# Patient Record
Sex: Female | Born: 1980 | Race: Black or African American | Hispanic: No | Marital: Single | Smoking: Former smoker
Health system: Southern US, Community
[De-identification: ages and names within clinical notes are randomized; demographics above are authoritative.]

## PROBLEM LIST (undated history)

## (undated) DIAGNOSIS — R51 Headache: Secondary | ICD-10-CM

## (undated) DIAGNOSIS — F319 Bipolar disorder, unspecified: Secondary | ICD-10-CM

## (undated) DIAGNOSIS — D649 Anemia, unspecified: Secondary | ICD-10-CM

## (undated) DIAGNOSIS — N611 Abscess of the breast and nipple: Secondary | ICD-10-CM

## (undated) DIAGNOSIS — F329 Major depressive disorder, single episode, unspecified: Secondary | ICD-10-CM

## (undated) DIAGNOSIS — E669 Obesity, unspecified: Secondary | ICD-10-CM

## (undated) DIAGNOSIS — R519 Headache, unspecified: Secondary | ICD-10-CM

## (undated) DIAGNOSIS — J309 Allergic rhinitis, unspecified: Secondary | ICD-10-CM

## (undated) DIAGNOSIS — L729 Follicular cyst of the skin and subcutaneous tissue, unspecified: Secondary | ICD-10-CM

## (undated) DIAGNOSIS — N8110 Cystocele, unspecified: Secondary | ICD-10-CM

## (undated) DIAGNOSIS — B009 Herpesviral infection, unspecified: Secondary | ICD-10-CM

## (undated) DIAGNOSIS — Z973 Presence of spectacles and contact lenses: Secondary | ICD-10-CM

## (undated) DIAGNOSIS — T7422XA Child sexual abuse, confirmed, initial encounter: Secondary | ICD-10-CM

## (undated) DIAGNOSIS — F419 Anxiety disorder, unspecified: Secondary | ICD-10-CM

## (undated) DIAGNOSIS — N739 Female pelvic inflammatory disease, unspecified: Secondary | ICD-10-CM

## (undated) DIAGNOSIS — F32A Depression, unspecified: Secondary | ICD-10-CM

## (undated) DIAGNOSIS — L0591 Pilonidal cyst without abscess: Secondary | ICD-10-CM

## (undated) DIAGNOSIS — K219 Gastro-esophageal reflux disease without esophagitis: Secondary | ICD-10-CM

## (undated) DIAGNOSIS — M722 Plantar fascial fibromatosis: Secondary | ICD-10-CM

## (undated) HISTORY — DX: Depression, unspecified: F32.A

## (undated) HISTORY — DX: Child sexual abuse, confirmed, initial encounter: T74.22XA

## (undated) HISTORY — DX: Female pelvic inflammatory disease, unspecified: N73.9

## (undated) HISTORY — DX: Anxiety disorder, unspecified: F41.9

## (undated) HISTORY — PX: BREAST LUMPECTOMY: SHX2

## (undated) HISTORY — PX: WISDOM TOOTH EXTRACTION: SHX21

## (undated) HISTORY — DX: Major depressive disorder, single episode, unspecified: F32.9

---
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
## 2000-04-27 ENCOUNTER — Emergency Department (HOSPITAL_COMMUNITY): Admission: EM | Admit: 2000-04-27 | Discharge: 2000-04-27 | Payer: Self-pay | Admitting: Emergency Medicine

## 2000-05-21 ENCOUNTER — Emergency Department (HOSPITAL_COMMUNITY): Admission: EM | Admit: 2000-05-21 | Discharge: 2000-05-21 | Payer: Self-pay | Admitting: Emergency Medicine

## 2000-05-21 ENCOUNTER — Encounter: Payer: Self-pay | Admitting: Emergency Medicine

## 2000-12-07 ENCOUNTER — Emergency Department (HOSPITAL_COMMUNITY): Admission: EM | Admit: 2000-12-07 | Discharge: 2000-12-07 | Payer: Self-pay | Admitting: Emergency Medicine

## 2000-12-29 ENCOUNTER — Emergency Department (HOSPITAL_COMMUNITY): Admission: EM | Admit: 2000-12-29 | Discharge: 2000-12-29 | Payer: Self-pay | Admitting: Emergency Medicine

## 2001-07-05 ENCOUNTER — Emergency Department (HOSPITAL_COMMUNITY): Admission: EM | Admit: 2001-07-05 | Discharge: 2001-07-05 | Payer: Self-pay | Admitting: Emergency Medicine

## 2001-08-15 ENCOUNTER — Emergency Department (HOSPITAL_COMMUNITY): Admission: EM | Admit: 2001-08-15 | Discharge: 2001-08-15 | Payer: Self-pay | Admitting: Emergency Medicine

## 2001-08-31 ENCOUNTER — Emergency Department (HOSPITAL_COMMUNITY): Admission: EM | Admit: 2001-08-31 | Discharge: 2001-08-31 | Payer: Self-pay | Admitting: Emergency Medicine

## 2001-10-16 ENCOUNTER — Emergency Department (HOSPITAL_COMMUNITY): Admission: EM | Admit: 2001-10-16 | Discharge: 2001-10-16 | Payer: Self-pay | Admitting: Emergency Medicine

## 2001-11-08 ENCOUNTER — Emergency Department (HOSPITAL_COMMUNITY): Admission: EM | Admit: 2001-11-08 | Discharge: 2001-11-09 | Payer: Self-pay | Admitting: Emergency Medicine

## 2001-12-21 ENCOUNTER — Encounter: Admission: RE | Admit: 2001-12-21 | Discharge: 2001-12-21 | Payer: Self-pay | Admitting: Family Medicine

## 2001-12-28 ENCOUNTER — Encounter: Admission: RE | Admit: 2001-12-28 | Discharge: 2001-12-28 | Payer: Self-pay | Admitting: Family Medicine

## 2002-01-10 ENCOUNTER — Encounter: Admission: RE | Admit: 2002-01-10 | Discharge: 2002-01-10 | Payer: Self-pay | Admitting: Sports Medicine

## 2002-01-17 ENCOUNTER — Encounter: Admission: RE | Admit: 2002-01-17 | Discharge: 2002-01-17 | Payer: Self-pay | Admitting: Family Medicine

## 2002-01-31 ENCOUNTER — Inpatient Hospital Stay (HOSPITAL_COMMUNITY): Admission: AD | Admit: 2002-01-31 | Discharge: 2002-02-01 | Payer: Self-pay | Admitting: Family Medicine

## 2002-01-31 ENCOUNTER — Encounter: Admission: RE | Admit: 2002-01-31 | Discharge: 2002-01-31 | Payer: Self-pay | Admitting: Family Medicine

## 2002-01-31 ENCOUNTER — Encounter: Payer: Self-pay | Admitting: Family Medicine

## 2002-02-09 ENCOUNTER — Encounter: Admission: RE | Admit: 2002-02-09 | Discharge: 2002-02-09 | Payer: Self-pay | Admitting: Family Medicine

## 2002-02-19 ENCOUNTER — Encounter: Admission: RE | Admit: 2002-02-19 | Discharge: 2002-02-19 | Payer: Self-pay | Admitting: Family Medicine

## 2002-02-23 ENCOUNTER — Ambulatory Visit (HOSPITAL_COMMUNITY): Admission: RE | Admit: 2002-02-23 | Discharge: 2002-02-23 | Payer: Self-pay | Admitting: Family Medicine

## 2002-03-02 ENCOUNTER — Encounter: Admission: RE | Admit: 2002-03-02 | Discharge: 2002-03-02 | Payer: Self-pay | Admitting: Family Medicine

## 2002-03-29 ENCOUNTER — Observation Stay (HOSPITAL_COMMUNITY): Admission: AD | Admit: 2002-03-29 | Discharge: 2002-03-30 | Payer: Self-pay | Admitting: *Deleted

## 2002-04-03 ENCOUNTER — Encounter: Admission: RE | Admit: 2002-04-03 | Discharge: 2002-04-03 | Payer: Self-pay | Admitting: Sports Medicine

## 2002-04-11 ENCOUNTER — Encounter: Admission: RE | Admit: 2002-04-11 | Discharge: 2002-04-11 | Payer: Self-pay | Admitting: Family Medicine

## 2002-05-04 ENCOUNTER — Encounter: Admission: RE | Admit: 2002-05-04 | Discharge: 2002-05-04 | Payer: Self-pay | Admitting: Family Medicine

## 2002-05-24 ENCOUNTER — Encounter: Admission: RE | Admit: 2002-05-24 | Discharge: 2002-05-24 | Payer: Self-pay | Admitting: Family Medicine

## 2002-05-28 ENCOUNTER — Inpatient Hospital Stay (HOSPITAL_COMMUNITY): Admission: AD | Admit: 2002-05-28 | Discharge: 2002-05-28 | Payer: Self-pay | Admitting: Family Medicine

## 2002-05-28 ENCOUNTER — Encounter: Admission: RE | Admit: 2002-05-28 | Discharge: 2002-05-28 | Payer: Self-pay | Admitting: Family Medicine

## 2002-05-30 ENCOUNTER — Encounter: Admission: RE | Admit: 2002-05-30 | Discharge: 2002-05-30 | Payer: Self-pay | Admitting: Family Medicine

## 2002-06-08 ENCOUNTER — Encounter: Admission: RE | Admit: 2002-06-08 | Discharge: 2002-06-08 | Payer: Self-pay | Admitting: Family Medicine

## 2002-06-20 ENCOUNTER — Encounter: Admission: RE | Admit: 2002-06-20 | Discharge: 2002-06-20 | Payer: Self-pay | Admitting: Family Medicine

## 2002-06-26 ENCOUNTER — Encounter: Admission: RE | Admit: 2002-06-26 | Discharge: 2002-06-26 | Payer: Self-pay | Admitting: Family Medicine

## 2002-07-03 ENCOUNTER — Encounter: Admission: RE | Admit: 2002-07-03 | Discharge: 2002-07-03 | Payer: Self-pay | Admitting: Sports Medicine

## 2002-07-06 ENCOUNTER — Inpatient Hospital Stay (HOSPITAL_COMMUNITY): Admission: AD | Admit: 2002-07-06 | Discharge: 2002-07-06 | Payer: Self-pay | Admitting: *Deleted

## 2002-07-10 ENCOUNTER — Encounter: Admission: RE | Admit: 2002-07-10 | Discharge: 2002-07-10 | Payer: Self-pay | Admitting: Family Medicine

## 2002-07-18 ENCOUNTER — Encounter: Admission: RE | Admit: 2002-07-18 | Discharge: 2002-07-18 | Payer: Self-pay | Admitting: Family Medicine

## 2002-07-19 ENCOUNTER — Encounter (HOSPITAL_COMMUNITY): Admission: RE | Admit: 2002-07-19 | Discharge: 2002-07-19 | Payer: Self-pay | Admitting: Family Medicine

## 2002-07-23 ENCOUNTER — Encounter: Admission: RE | Admit: 2002-07-23 | Discharge: 2002-07-23 | Payer: Self-pay | Admitting: Family Medicine

## 2002-07-25 ENCOUNTER — Encounter: Admission: RE | Admit: 2002-07-25 | Discharge: 2002-07-25 | Payer: Self-pay | Admitting: Family Medicine

## 2002-07-25 ENCOUNTER — Encounter: Admission: RE | Admit: 2002-07-25 | Discharge: 2002-07-25 | Payer: Self-pay | Admitting: Obstetrics and Gynecology

## 2002-07-27 ENCOUNTER — Inpatient Hospital Stay (HOSPITAL_COMMUNITY): Admission: AD | Admit: 2002-07-27 | Discharge: 2002-07-29 | Payer: Self-pay | Admitting: *Deleted

## 2002-08-21 ENCOUNTER — Inpatient Hospital Stay (HOSPITAL_COMMUNITY): Admission: AD | Admit: 2002-08-21 | Discharge: 2002-08-21 | Payer: Self-pay | Admitting: *Deleted

## 2002-09-05 ENCOUNTER — Encounter: Admission: RE | Admit: 2002-09-05 | Discharge: 2002-09-05 | Payer: Self-pay | Admitting: Family Medicine

## 2002-10-26 ENCOUNTER — Encounter: Admission: RE | Admit: 2002-10-26 | Discharge: 2002-10-26 | Payer: Self-pay | Admitting: Family Medicine

## 2002-11-23 ENCOUNTER — Emergency Department (HOSPITAL_COMMUNITY): Admission: AD | Admit: 2002-11-23 | Discharge: 2002-11-24 | Payer: Self-pay

## 2002-11-24 ENCOUNTER — Encounter: Payer: Self-pay | Admitting: Emergency Medicine

## 2002-11-25 ENCOUNTER — Emergency Department (HOSPITAL_COMMUNITY): Admission: EM | Admit: 2002-11-25 | Discharge: 2002-11-25 | Payer: Self-pay | Admitting: Emergency Medicine

## 2002-11-28 ENCOUNTER — Emergency Department (HOSPITAL_COMMUNITY): Admission: EM | Admit: 2002-11-28 | Discharge: 2002-11-28 | Payer: Self-pay | Admitting: Emergency Medicine

## 2002-12-13 ENCOUNTER — Encounter: Admission: RE | Admit: 2002-12-13 | Discharge: 2002-12-13 | Payer: Self-pay | Admitting: Family Medicine

## 2002-12-14 ENCOUNTER — Encounter: Admission: RE | Admit: 2002-12-14 | Discharge: 2002-12-14 | Payer: Self-pay | Admitting: Family Medicine

## 2002-12-17 ENCOUNTER — Emergency Department (HOSPITAL_COMMUNITY): Admission: EM | Admit: 2002-12-17 | Discharge: 2002-12-17 | Payer: Self-pay

## 2002-12-19 ENCOUNTER — Encounter: Admission: RE | Admit: 2002-12-19 | Discharge: 2002-12-19 | Payer: Self-pay | Admitting: Family Medicine

## 2002-12-26 ENCOUNTER — Emergency Department (HOSPITAL_COMMUNITY): Admission: EM | Admit: 2002-12-26 | Discharge: 2002-12-26 | Payer: Self-pay | Admitting: Emergency Medicine

## 2002-12-26 ENCOUNTER — Encounter: Payer: Self-pay | Admitting: Emergency Medicine

## 2003-01-09 ENCOUNTER — Emergency Department (HOSPITAL_COMMUNITY): Admission: EM | Admit: 2003-01-09 | Discharge: 2003-01-09 | Payer: Self-pay | Admitting: Emergency Medicine

## 2003-01-10 ENCOUNTER — Emergency Department (HOSPITAL_COMMUNITY): Admission: EM | Admit: 2003-01-10 | Discharge: 2003-01-11 | Payer: Self-pay | Admitting: Emergency Medicine

## 2003-01-11 ENCOUNTER — Emergency Department (HOSPITAL_COMMUNITY): Admission: EM | Admit: 2003-01-11 | Discharge: 2003-01-11 | Payer: Self-pay | Admitting: Emergency Medicine

## 2003-01-11 ENCOUNTER — Encounter: Payer: Self-pay | Admitting: Emergency Medicine

## 2003-01-11 ENCOUNTER — Encounter: Admission: RE | Admit: 2003-01-11 | Discharge: 2003-01-11 | Payer: Self-pay | Admitting: Family Medicine

## 2003-01-13 ENCOUNTER — Emergency Department (HOSPITAL_COMMUNITY): Admission: EM | Admit: 2003-01-13 | Discharge: 2003-01-13 | Payer: Self-pay | Admitting: Emergency Medicine

## 2003-01-18 ENCOUNTER — Encounter: Admission: RE | Admit: 2003-01-18 | Discharge: 2003-01-18 | Payer: Self-pay | Admitting: Family Medicine

## 2003-01-24 ENCOUNTER — Emergency Department (HOSPITAL_COMMUNITY): Admission: EM | Admit: 2003-01-24 | Discharge: 2003-01-24 | Payer: Self-pay | Admitting: Emergency Medicine

## 2003-01-27 ENCOUNTER — Emergency Department (HOSPITAL_COMMUNITY): Admission: EM | Admit: 2003-01-27 | Discharge: 2003-01-27 | Payer: Self-pay | Admitting: Emergency Medicine

## 2003-02-25 ENCOUNTER — Emergency Department (HOSPITAL_COMMUNITY): Admission: EM | Admit: 2003-02-25 | Discharge: 2003-02-25 | Payer: Self-pay

## 2003-02-28 ENCOUNTER — Emergency Department (HOSPITAL_COMMUNITY): Admission: AD | Admit: 2003-02-28 | Discharge: 2003-02-28 | Payer: Self-pay | Admitting: Family Medicine

## 2003-03-03 ENCOUNTER — Emergency Department (HOSPITAL_COMMUNITY): Admission: AD | Admit: 2003-03-03 | Discharge: 2003-03-03 | Payer: Self-pay | Admitting: Family Medicine

## 2003-03-04 ENCOUNTER — Emergency Department (HOSPITAL_COMMUNITY): Admission: AD | Admit: 2003-03-04 | Discharge: 2003-03-04 | Payer: Self-pay | Admitting: Family Medicine

## 2003-04-17 ENCOUNTER — Encounter: Admission: RE | Admit: 2003-04-17 | Discharge: 2003-04-17 | Payer: Self-pay | Admitting: Family Medicine

## 2003-05-22 ENCOUNTER — Emergency Department (HOSPITAL_COMMUNITY): Admission: EM | Admit: 2003-05-22 | Discharge: 2003-05-23 | Payer: Self-pay

## 2003-05-25 ENCOUNTER — Emergency Department (HOSPITAL_COMMUNITY): Admission: EM | Admit: 2003-05-25 | Discharge: 2003-05-26 | Payer: Self-pay | Admitting: Emergency Medicine

## 2003-05-30 ENCOUNTER — Emergency Department (HOSPITAL_COMMUNITY): Admission: AD | Admit: 2003-05-30 | Discharge: 2003-05-30 | Payer: Self-pay | Admitting: Family Medicine

## 2003-06-17 ENCOUNTER — Emergency Department (HOSPITAL_COMMUNITY): Admission: AD | Admit: 2003-06-17 | Discharge: 2003-06-17 | Payer: Self-pay | Admitting: Family Medicine

## 2003-06-21 ENCOUNTER — Encounter: Admission: RE | Admit: 2003-06-21 | Discharge: 2003-06-21 | Payer: Self-pay | Admitting: Family Medicine

## 2003-06-24 ENCOUNTER — Encounter: Admission: RE | Admit: 2003-06-24 | Discharge: 2003-06-24 | Payer: Self-pay | Admitting: Family Medicine

## 2003-07-08 ENCOUNTER — Other Ambulatory Visit: Admission: RE | Admit: 2003-07-08 | Discharge: 2003-07-08 | Payer: Self-pay | Admitting: Family Medicine

## 2003-07-08 ENCOUNTER — Encounter (INDEPENDENT_AMBULATORY_CARE_PROVIDER_SITE_OTHER): Payer: Self-pay | Admitting: Specialist

## 2003-07-08 ENCOUNTER — Encounter: Admission: RE | Admit: 2003-07-08 | Discharge: 2003-07-08 | Payer: Self-pay | Admitting: Family Medicine

## 2003-07-21 ENCOUNTER — Emergency Department (HOSPITAL_COMMUNITY): Admission: EM | Admit: 2003-07-21 | Discharge: 2003-07-21 | Payer: Self-pay | Admitting: Family Medicine

## 2003-08-05 ENCOUNTER — Emergency Department (HOSPITAL_COMMUNITY): Admission: EM | Admit: 2003-08-05 | Discharge: 2003-08-05 | Payer: Self-pay | Admitting: Emergency Medicine

## 2003-08-07 ENCOUNTER — Encounter: Admission: RE | Admit: 2003-08-07 | Discharge: 2003-08-07 | Payer: Self-pay | Admitting: Family Medicine

## 2003-09-04 ENCOUNTER — Encounter: Admission: RE | Admit: 2003-09-04 | Discharge: 2003-09-04 | Payer: Self-pay | Admitting: Sports Medicine

## 2003-09-10 ENCOUNTER — Encounter: Admission: RE | Admit: 2003-09-10 | Discharge: 2003-09-10 | Payer: Self-pay | Admitting: Family Medicine

## 2003-09-30 ENCOUNTER — Inpatient Hospital Stay (HOSPITAL_COMMUNITY): Admission: AD | Admit: 2003-09-30 | Discharge: 2003-09-30 | Payer: Self-pay | Admitting: Obstetrics & Gynecology

## 2003-10-08 ENCOUNTER — Emergency Department (HOSPITAL_COMMUNITY): Admission: EM | Admit: 2003-10-08 | Discharge: 2003-10-09 | Payer: Self-pay | Admitting: Emergency Medicine

## 2003-10-14 ENCOUNTER — Encounter: Admission: RE | Admit: 2003-10-14 | Discharge: 2003-10-14 | Payer: Self-pay | Admitting: Family Medicine

## 2003-10-14 ENCOUNTER — Encounter: Admission: RE | Admit: 2003-10-14 | Discharge: 2003-10-14 | Payer: Self-pay | Admitting: Sports Medicine

## 2003-11-03 ENCOUNTER — Emergency Department (HOSPITAL_COMMUNITY): Admission: EM | Admit: 2003-11-03 | Discharge: 2003-11-03 | Payer: Self-pay | Admitting: Family Medicine

## 2003-12-20 ENCOUNTER — Ambulatory Visit: Payer: Self-pay | Admitting: Family Medicine

## 2003-12-27 ENCOUNTER — Encounter: Admission: RE | Admit: 2003-12-27 | Discharge: 2003-12-27 | Payer: Self-pay | Admitting: Sports Medicine

## 2004-01-15 ENCOUNTER — Ambulatory Visit: Payer: Self-pay | Admitting: Sports Medicine

## 2004-01-17 ENCOUNTER — Ambulatory Visit: Payer: Self-pay | Admitting: Sports Medicine

## 2004-02-28 ENCOUNTER — Ambulatory Visit: Payer: Self-pay

## 2004-02-29 ENCOUNTER — Emergency Department (HOSPITAL_COMMUNITY): Admission: EM | Admit: 2004-02-29 | Discharge: 2004-02-29 | Payer: Self-pay | Admitting: Family Medicine

## 2004-03-06 ENCOUNTER — Ambulatory Visit: Payer: Self-pay | Admitting: Sports Medicine

## 2004-03-07 ENCOUNTER — Emergency Department (HOSPITAL_COMMUNITY): Admission: EM | Admit: 2004-03-07 | Discharge: 2004-03-08 | Payer: Self-pay | Admitting: Emergency Medicine

## 2004-04-21 ENCOUNTER — Ambulatory Visit: Payer: Self-pay | Admitting: Family Medicine

## 2004-04-22 ENCOUNTER — Ambulatory Visit: Payer: Self-pay | Admitting: Family Medicine

## 2004-05-13 ENCOUNTER — Ambulatory Visit: Payer: Self-pay | Admitting: Family Medicine

## 2004-06-04 ENCOUNTER — Ambulatory Visit: Payer: Self-pay | Admitting: Family Medicine

## 2004-06-23 ENCOUNTER — Ambulatory Visit: Payer: Self-pay | Admitting: Family Medicine

## 2004-07-14 ENCOUNTER — Ambulatory Visit: Payer: Self-pay | Admitting: Sports Medicine

## 2004-08-20 ENCOUNTER — Encounter (INDEPENDENT_AMBULATORY_CARE_PROVIDER_SITE_OTHER): Payer: Self-pay | Admitting: *Deleted

## 2004-08-20 LAB — CONVERTED CEMR LAB

## 2004-09-15 ENCOUNTER — Other Ambulatory Visit: Admission: RE | Admit: 2004-09-15 | Discharge: 2004-09-15 | Payer: Self-pay | Admitting: Family Medicine

## 2004-09-15 ENCOUNTER — Ambulatory Visit: Payer: Self-pay | Admitting: Sports Medicine

## 2004-09-18 ENCOUNTER — Ambulatory Visit: Payer: Self-pay | Admitting: Sports Medicine

## 2004-09-25 ENCOUNTER — Ambulatory Visit: Payer: Self-pay | Admitting: Family Medicine

## 2004-09-29 ENCOUNTER — Emergency Department (HOSPITAL_COMMUNITY): Admission: EM | Admit: 2004-09-29 | Discharge: 2004-09-29 | Payer: Self-pay | Admitting: Family Medicine

## 2004-10-02 ENCOUNTER — Emergency Department (HOSPITAL_COMMUNITY): Admission: EM | Admit: 2004-10-02 | Discharge: 2004-10-02 | Payer: Self-pay | Admitting: Family Medicine

## 2004-10-26 ENCOUNTER — Ambulatory Visit: Payer: Self-pay | Admitting: Family Medicine

## 2004-11-10 ENCOUNTER — Ambulatory Visit: Payer: Self-pay | Admitting: Sports Medicine

## 2004-12-08 ENCOUNTER — Ambulatory Visit: Payer: Self-pay | Admitting: Sports Medicine

## 2004-12-15 ENCOUNTER — Emergency Department (HOSPITAL_COMMUNITY): Admission: EM | Admit: 2004-12-15 | Discharge: 2004-12-15 | Payer: Self-pay | Admitting: Family Medicine

## 2004-12-29 ENCOUNTER — Ambulatory Visit: Payer: Self-pay | Admitting: Family Medicine

## 2005-03-20 ENCOUNTER — Inpatient Hospital Stay (HOSPITAL_COMMUNITY): Admission: AD | Admit: 2005-03-20 | Discharge: 2005-03-20 | Payer: Self-pay | Admitting: Obstetrics & Gynecology

## 2005-05-21 ENCOUNTER — Emergency Department (HOSPITAL_COMMUNITY): Admission: EM | Admit: 2005-05-21 | Discharge: 2005-05-21 | Payer: Self-pay | Admitting: Family Medicine

## 2005-05-29 ENCOUNTER — Emergency Department (HOSPITAL_COMMUNITY): Admission: EM | Admit: 2005-05-29 | Discharge: 2005-05-29 | Payer: Self-pay | Admitting: Family Medicine

## 2005-05-31 ENCOUNTER — Emergency Department (HOSPITAL_COMMUNITY): Admission: EM | Admit: 2005-05-31 | Discharge: 2005-05-31 | Payer: Self-pay | Admitting: Family Medicine

## 2005-06-02 ENCOUNTER — Emergency Department (HOSPITAL_COMMUNITY): Admission: EM | Admit: 2005-06-02 | Discharge: 2005-06-02 | Payer: Self-pay | Admitting: Emergency Medicine

## 2005-06-04 ENCOUNTER — Emergency Department (HOSPITAL_COMMUNITY): Admission: EM | Admit: 2005-06-04 | Discharge: 2005-06-04 | Payer: Self-pay | Admitting: Emergency Medicine

## 2005-06-16 ENCOUNTER — Ambulatory Visit: Payer: Self-pay | Admitting: Family Medicine

## 2005-06-18 ENCOUNTER — Ambulatory Visit: Payer: Self-pay | Admitting: Family Medicine

## 2005-06-29 ENCOUNTER — Ambulatory Visit: Payer: Self-pay | Admitting: Sports Medicine

## 2005-08-13 ENCOUNTER — Ambulatory Visit: Payer: Self-pay | Admitting: Family Medicine

## 2005-09-21 ENCOUNTER — Ambulatory Visit: Payer: Self-pay | Admitting: Sports Medicine

## 2005-10-10 ENCOUNTER — Inpatient Hospital Stay (HOSPITAL_COMMUNITY): Admission: AD | Admit: 2005-10-10 | Discharge: 2005-10-11 | Payer: Self-pay | Admitting: Gynecology

## 2005-10-19 ENCOUNTER — Ambulatory Visit: Payer: Self-pay | Admitting: Family Medicine

## 2005-11-01 ENCOUNTER — Emergency Department (HOSPITAL_COMMUNITY): Admission: EM | Admit: 2005-11-01 | Discharge: 2005-11-01 | Payer: Self-pay | Admitting: Emergency Medicine

## 2005-12-22 ENCOUNTER — Inpatient Hospital Stay (HOSPITAL_COMMUNITY): Admission: AD | Admit: 2005-12-22 | Discharge: 2005-12-22 | Payer: Self-pay | Admitting: Gynecology

## 2005-12-23 ENCOUNTER — Ambulatory Visit: Payer: Self-pay | Admitting: Family Medicine

## 2006-01-22 ENCOUNTER — Emergency Department (HOSPITAL_COMMUNITY): Admission: EM | Admit: 2006-01-22 | Discharge: 2006-01-22 | Payer: Self-pay | Admitting: Family Medicine

## 2006-02-12 ENCOUNTER — Emergency Department (HOSPITAL_COMMUNITY): Admission: EM | Admit: 2006-02-12 | Discharge: 2006-02-12 | Payer: Self-pay | Admitting: Family Medicine

## 2006-02-15 ENCOUNTER — Emergency Department (HOSPITAL_COMMUNITY): Admission: EM | Admit: 2006-02-15 | Discharge: 2006-02-15 | Payer: Self-pay | Admitting: Emergency Medicine

## 2006-02-26 ENCOUNTER — Emergency Department (HOSPITAL_COMMUNITY): Admission: EM | Admit: 2006-02-26 | Discharge: 2006-02-26 | Payer: Self-pay | Admitting: Emergency Medicine

## 2006-03-11 ENCOUNTER — Emergency Department (HOSPITAL_COMMUNITY): Admission: EM | Admit: 2006-03-11 | Discharge: 2006-03-11 | Payer: Self-pay | Admitting: Family Medicine

## 2006-03-19 ENCOUNTER — Emergency Department (HOSPITAL_COMMUNITY): Admission: EM | Admit: 2006-03-19 | Discharge: 2006-03-19 | Payer: Self-pay | Admitting: Emergency Medicine

## 2006-04-11 ENCOUNTER — Ambulatory Visit: Payer: Self-pay | Admitting: Sports Medicine

## 2006-04-21 ENCOUNTER — Emergency Department (HOSPITAL_COMMUNITY): Admission: EM | Admit: 2006-04-21 | Discharge: 2006-04-21 | Payer: Self-pay | Admitting: Family Medicine

## 2006-05-12 ENCOUNTER — Emergency Department (HOSPITAL_COMMUNITY): Admission: EM | Admit: 2006-05-12 | Discharge: 2006-05-12 | Payer: Self-pay | Admitting: Family Medicine

## 2006-05-15 ENCOUNTER — Emergency Department (HOSPITAL_COMMUNITY): Admission: EM | Admit: 2006-05-15 | Discharge: 2006-05-15 | Payer: Self-pay | Admitting: Emergency Medicine

## 2006-05-19 DIAGNOSIS — F411 Generalized anxiety disorder: Secondary | ICD-10-CM

## 2006-05-19 DIAGNOSIS — F329 Major depressive disorder, single episode, unspecified: Secondary | ICD-10-CM

## 2006-05-20 ENCOUNTER — Encounter (INDEPENDENT_AMBULATORY_CARE_PROVIDER_SITE_OTHER): Payer: Self-pay | Admitting: *Deleted

## 2006-07-07 ENCOUNTER — Emergency Department (HOSPITAL_COMMUNITY): Admission: EM | Admit: 2006-07-07 | Discharge: 2006-07-07 | Payer: Self-pay | Admitting: Family Medicine

## 2006-07-08 ENCOUNTER — Encounter: Admission: RE | Admit: 2006-07-08 | Discharge: 2006-07-08 | Payer: Self-pay | Admitting: Obstetrics & Gynecology

## 2006-07-08 ENCOUNTER — Emergency Department (HOSPITAL_COMMUNITY): Admission: EM | Admit: 2006-07-08 | Discharge: 2006-07-08 | Payer: Self-pay | Admitting: Family Medicine

## 2006-07-09 ENCOUNTER — Emergency Department (HOSPITAL_COMMUNITY): Admission: EM | Admit: 2006-07-09 | Discharge: 2006-07-09 | Payer: Self-pay | Admitting: Emergency Medicine

## 2006-07-11 ENCOUNTER — Emergency Department (HOSPITAL_COMMUNITY): Admission: EM | Admit: 2006-07-11 | Discharge: 2006-07-11 | Payer: Self-pay | Admitting: Emergency Medicine

## 2006-08-30 ENCOUNTER — Emergency Department (HOSPITAL_COMMUNITY): Admission: EM | Admit: 2006-08-30 | Discharge: 2006-08-30 | Payer: Self-pay | Admitting: Emergency Medicine

## 2006-09-09 ENCOUNTER — Emergency Department (HOSPITAL_COMMUNITY): Admission: EM | Admit: 2006-09-09 | Discharge: 2006-09-09 | Payer: Self-pay | Admitting: Emergency Medicine

## 2006-09-12 ENCOUNTER — Telehealth: Payer: Self-pay | Admitting: *Deleted

## 2006-09-12 ENCOUNTER — Encounter: Payer: Self-pay | Admitting: Family Medicine

## 2006-09-12 ENCOUNTER — Encounter (INDEPENDENT_AMBULATORY_CARE_PROVIDER_SITE_OTHER): Payer: Self-pay | Admitting: Family Medicine

## 2006-09-12 ENCOUNTER — Ambulatory Visit: Payer: Self-pay | Admitting: Sports Medicine

## 2006-09-12 LAB — CONVERTED CEMR LAB
Antibody Screen: NEGATIVE
Bilirubin Urine: NEGATIVE
Glucose, Urine, Semiquant: NEGATIVE
Lymphocytes Relative: 46 % (ref 12–46)
Lymphs Abs: 3.8 10*3/uL — ABNORMAL HIGH (ref 0.7–3.3)
MCV: 63.3 fL — ABNORMAL LOW (ref 78.0–100.0)
Monocytes Relative: 6 % (ref 3–11)
Neutro Abs: 3.9 10*3/uL (ref 1.7–7.7)
Neutrophils Relative %: 47 % (ref 43–77)
Nitrite: NEGATIVE
RBC: 5.8 M/uL — ABNORMAL HIGH (ref 3.87–5.11)
Rh Type: POSITIVE
Rubella: 22.6 intl units/mL — ABNORMAL HIGH
Specific Gravity, Urine: 1.025
Urine culture (CF units/mL): NEGATIVE CFunits/mL
WBC, UA: 0 cells/hpf
WBC: 8.3 10*3/uL (ref 4.0–10.5)
Whiff Test: POSITIVE
pH: 6.5

## 2006-09-13 ENCOUNTER — Encounter: Payer: Self-pay | Admitting: Family Medicine

## 2006-09-13 LAB — CONVERTED CEMR LAB: Antibody Screen: NEGATIVE

## 2006-09-14 ENCOUNTER — Telehealth: Payer: Self-pay | Admitting: Family Medicine

## 2006-09-14 ENCOUNTER — Ambulatory Visit (HOSPITAL_COMMUNITY): Admission: RE | Admit: 2006-09-14 | Discharge: 2006-09-14 | Payer: Self-pay | Admitting: Family Medicine

## 2006-09-15 ENCOUNTER — Encounter: Payer: Self-pay | Admitting: Family Medicine

## 2006-09-18 ENCOUNTER — Inpatient Hospital Stay (HOSPITAL_COMMUNITY): Admission: AD | Admit: 2006-09-18 | Discharge: 2006-09-18 | Payer: Self-pay | Admitting: Gynecology

## 2006-10-05 ENCOUNTER — Telehealth: Payer: Self-pay | Admitting: *Deleted

## 2006-10-06 ENCOUNTER — Inpatient Hospital Stay (HOSPITAL_COMMUNITY): Admission: AD | Admit: 2006-10-06 | Discharge: 2006-10-06 | Payer: Self-pay | Admitting: Obstetrics & Gynecology

## 2006-10-06 ENCOUNTER — Encounter: Payer: Self-pay | Admitting: *Deleted

## 2006-10-07 ENCOUNTER — Encounter: Payer: Self-pay | Admitting: Family Medicine

## 2006-10-07 ENCOUNTER — Ambulatory Visit: Payer: Self-pay | Admitting: Family Medicine

## 2006-10-07 ENCOUNTER — Encounter (INDEPENDENT_AMBULATORY_CARE_PROVIDER_SITE_OTHER): Payer: Self-pay | Admitting: Family Medicine

## 2006-10-07 LAB — CONVERTED CEMR LAB
GC Culture Only: NEGATIVE
GC Probe Amp, Genital: NEGATIVE
KOH Prep: NEGATIVE
Whiff Test: POSITIVE

## 2006-10-08 ENCOUNTER — Encounter (INDEPENDENT_AMBULATORY_CARE_PROVIDER_SITE_OTHER): Payer: Self-pay | Admitting: Family Medicine

## 2006-10-11 ENCOUNTER — Inpatient Hospital Stay (HOSPITAL_COMMUNITY): Admission: AD | Admit: 2006-10-11 | Discharge: 2006-10-11 | Payer: Self-pay | Admitting: Gynecology

## 2006-10-11 ENCOUNTER — Telehealth (INDEPENDENT_AMBULATORY_CARE_PROVIDER_SITE_OTHER): Payer: Self-pay | Admitting: *Deleted

## 2006-10-12 ENCOUNTER — Encounter (INDEPENDENT_AMBULATORY_CARE_PROVIDER_SITE_OTHER): Payer: Self-pay | Admitting: Family Medicine

## 2006-10-12 ENCOUNTER — Encounter: Payer: Self-pay | Admitting: Family Medicine

## 2006-10-17 ENCOUNTER — Telehealth: Payer: Self-pay | Admitting: *Deleted

## 2006-10-18 ENCOUNTER — Encounter (INDEPENDENT_AMBULATORY_CARE_PROVIDER_SITE_OTHER): Payer: Self-pay | Admitting: Family Medicine

## 2006-10-25 ENCOUNTER — Telehealth (INDEPENDENT_AMBULATORY_CARE_PROVIDER_SITE_OTHER): Payer: Self-pay | Admitting: *Deleted

## 2006-10-25 ENCOUNTER — Ambulatory Visit: Payer: Self-pay | Admitting: Family Medicine

## 2006-10-25 LAB — CONVERTED CEMR LAB
Nitrite: NEGATIVE
Specific Gravity, Urine: >=1.03
Urobilinogen, UA: 0.2
WBC Urine, dipstick: NEGATIVE

## 2006-11-01 ENCOUNTER — Telehealth (INDEPENDENT_AMBULATORY_CARE_PROVIDER_SITE_OTHER): Payer: Self-pay | Admitting: *Deleted

## 2006-11-02 ENCOUNTER — Telehealth (INDEPENDENT_AMBULATORY_CARE_PROVIDER_SITE_OTHER): Payer: Self-pay | Admitting: *Deleted

## 2006-11-02 ENCOUNTER — Ambulatory Visit: Payer: Self-pay | Admitting: Family Medicine

## 2006-11-02 LAB — CONVERTED CEMR LAB

## 2006-11-06 ENCOUNTER — Inpatient Hospital Stay (HOSPITAL_COMMUNITY): Admission: AD | Admit: 2006-11-06 | Discharge: 2006-11-06 | Payer: Self-pay | Admitting: Family Medicine

## 2006-11-07 ENCOUNTER — Ambulatory Visit: Payer: Self-pay | Admitting: Sports Medicine

## 2006-11-11 ENCOUNTER — Encounter: Payer: Self-pay | Admitting: *Deleted

## 2006-11-11 ENCOUNTER — Telehealth: Payer: Self-pay | Admitting: *Deleted

## 2006-11-15 ENCOUNTER — Telehealth (INDEPENDENT_AMBULATORY_CARE_PROVIDER_SITE_OTHER): Payer: Self-pay | Admitting: Family Medicine

## 2006-11-20 ENCOUNTER — Inpatient Hospital Stay (HOSPITAL_COMMUNITY): Admission: AD | Admit: 2006-11-20 | Discharge: 2006-11-20 | Payer: Self-pay | Admitting: Family Medicine

## 2006-11-24 ENCOUNTER — Ambulatory Visit: Payer: Self-pay | Admitting: Family Medicine

## 2006-11-24 ENCOUNTER — Encounter (INDEPENDENT_AMBULATORY_CARE_PROVIDER_SITE_OTHER): Payer: Self-pay | Admitting: Family Medicine

## 2006-11-24 ENCOUNTER — Telehealth (INDEPENDENT_AMBULATORY_CARE_PROVIDER_SITE_OTHER): Payer: Self-pay | Admitting: Family Medicine

## 2006-12-08 ENCOUNTER — Encounter (INDEPENDENT_AMBULATORY_CARE_PROVIDER_SITE_OTHER): Payer: Self-pay | Admitting: Family Medicine

## 2006-12-08 ENCOUNTER — Ambulatory Visit (HOSPITAL_COMMUNITY): Admission: RE | Admit: 2006-12-08 | Discharge: 2006-12-08 | Payer: Self-pay | Admitting: Family Medicine

## 2006-12-23 ENCOUNTER — Ambulatory Visit: Payer: Self-pay | Admitting: Family Medicine

## 2006-12-23 LAB — CONVERTED CEMR LAB
Glucose, Urine, Semiquant: NEGATIVE
Protein, U semiquant: NEGATIVE

## 2006-12-27 ENCOUNTER — Encounter (INDEPENDENT_AMBULATORY_CARE_PROVIDER_SITE_OTHER): Payer: Self-pay | Admitting: *Deleted

## 2006-12-27 ENCOUNTER — Telehealth (INDEPENDENT_AMBULATORY_CARE_PROVIDER_SITE_OTHER): Payer: Self-pay | Admitting: *Deleted

## 2007-01-02 ENCOUNTER — Telehealth: Payer: Self-pay | Admitting: *Deleted

## 2007-01-03 ENCOUNTER — Telehealth: Payer: Self-pay | Admitting: *Deleted

## 2007-01-04 ENCOUNTER — Ambulatory Visit: Payer: Self-pay | Admitting: Family Medicine

## 2007-01-04 LAB — CONVERTED CEMR LAB

## 2007-01-13 ENCOUNTER — Telehealth: Payer: Self-pay | Admitting: *Deleted

## 2007-01-20 ENCOUNTER — Ambulatory Visit: Payer: Self-pay | Admitting: Family Medicine

## 2007-01-25 ENCOUNTER — Ambulatory Visit: Payer: Self-pay | Admitting: *Deleted

## 2007-01-25 ENCOUNTER — Ambulatory Visit: Payer: Self-pay | Admitting: Family Medicine

## 2007-01-25 ENCOUNTER — Inpatient Hospital Stay (HOSPITAL_COMMUNITY): Admission: AD | Admit: 2007-01-25 | Discharge: 2007-01-25 | Payer: Self-pay | Admitting: Gynecology

## 2007-01-25 LAB — CONVERTED CEMR LAB: Glucose, Urine, Semiquant: NEGATIVE

## 2007-02-20 ENCOUNTER — Ambulatory Visit: Payer: Self-pay | Admitting: Family Medicine

## 2007-02-20 LAB — CONVERTED CEMR LAB
Blood in Urine, dipstick: NEGATIVE
Ketones, urine, test strip: NEGATIVE
Nitrite: NEGATIVE
Urobilinogen, UA: 1

## 2007-03-02 ENCOUNTER — Encounter: Payer: Self-pay | Admitting: *Deleted

## 2007-03-02 ENCOUNTER — Ambulatory Visit: Payer: Self-pay | Admitting: Obstetrics and Gynecology

## 2007-03-02 ENCOUNTER — Inpatient Hospital Stay (HOSPITAL_COMMUNITY): Admission: AD | Admit: 2007-03-02 | Discharge: 2007-03-02 | Payer: Self-pay | Admitting: Obstetrics & Gynecology

## 2007-03-07 ENCOUNTER — Ambulatory Visit: Payer: Self-pay

## 2007-03-07 ENCOUNTER — Encounter (INDEPENDENT_AMBULATORY_CARE_PROVIDER_SITE_OTHER): Payer: Self-pay | Admitting: Family Medicine

## 2007-03-07 LAB — CONVERTED CEMR LAB
Glucose, Urine, Semiquant: NEGATIVE
HCT: 30.5 % — ABNORMAL LOW (ref 36.0–46.0)
Hemoglobin: 9.3 g/dL — ABNORMAL LOW (ref 12.0–15.0)
MCHC: 30.5 g/dL (ref 30.0–36.0)
MCV: 64.6 fL — ABNORMAL LOW (ref 78.0–100.0)
Platelets: 196 10*3/uL (ref 150–400)
RDW: 16.4 % — ABNORMAL HIGH (ref 11.5–15.5)

## 2007-03-13 LAB — CONVERTED CEMR LAB: VDRL, Serum: NONREACTIVE

## 2007-03-14 ENCOUNTER — Encounter (INDEPENDENT_AMBULATORY_CARE_PROVIDER_SITE_OTHER): Payer: Self-pay | Admitting: Family Medicine

## 2007-03-21 ENCOUNTER — Ambulatory Visit: Payer: Self-pay | Admitting: Family Medicine

## 2007-03-21 LAB — CONVERTED CEMR LAB: Protein, U semiquant: NEGATIVE

## 2007-03-31 ENCOUNTER — Ambulatory Visit: Payer: Self-pay | Admitting: Family Medicine

## 2007-04-11 ENCOUNTER — Encounter: Payer: Self-pay | Admitting: Family Medicine

## 2007-04-11 ENCOUNTER — Ambulatory Visit: Payer: Self-pay | Admitting: Family Medicine

## 2007-04-11 LAB — CONVERTED CEMR LAB
Glucose, Urine, Semiquant: NEGATIVE
Whiff Test: NEGATIVE

## 2007-04-14 ENCOUNTER — Telehealth (INDEPENDENT_AMBULATORY_CARE_PROVIDER_SITE_OTHER): Payer: Self-pay | Admitting: Family Medicine

## 2007-04-15 ENCOUNTER — Inpatient Hospital Stay (HOSPITAL_COMMUNITY): Admission: AD | Admit: 2007-04-15 | Discharge: 2007-04-15 | Payer: Self-pay | Admitting: Obstetrics and Gynecology

## 2007-04-15 ENCOUNTER — Ambulatory Visit: Payer: Self-pay | Admitting: Obstetrics and Gynecology

## 2007-04-17 ENCOUNTER — Ambulatory Visit: Payer: Self-pay | Admitting: Family Medicine

## 2007-04-17 LAB — CONVERTED CEMR LAB
Glucose, Urine, Semiquant: NEGATIVE
Protein, U semiquant: NEGATIVE

## 2007-04-27 ENCOUNTER — Ambulatory Visit: Payer: Self-pay | Admitting: Family Medicine

## 2007-04-28 ENCOUNTER — Ambulatory Visit: Payer: Self-pay | Admitting: Physician Assistant

## 2007-04-28 ENCOUNTER — Inpatient Hospital Stay (HOSPITAL_COMMUNITY): Admission: AD | Admit: 2007-04-28 | Discharge: 2007-04-28 | Payer: Self-pay | Admitting: Obstetrics & Gynecology

## 2007-05-01 ENCOUNTER — Inpatient Hospital Stay (HOSPITAL_COMMUNITY): Admission: AD | Admit: 2007-05-01 | Discharge: 2007-05-02 | Payer: Self-pay | Admitting: Obstetrics & Gynecology

## 2007-05-01 ENCOUNTER — Ambulatory Visit: Payer: Self-pay | Admitting: Family Medicine

## 2007-05-02 ENCOUNTER — Inpatient Hospital Stay (HOSPITAL_COMMUNITY): Admission: AD | Admit: 2007-05-02 | Discharge: 2007-05-04 | Payer: Self-pay | Admitting: Obstetrics & Gynecology

## 2007-05-02 ENCOUNTER — Ambulatory Visit: Payer: Self-pay | Admitting: Gynecology

## 2007-05-09 ENCOUNTER — Encounter (INDEPENDENT_AMBULATORY_CARE_PROVIDER_SITE_OTHER): Payer: Self-pay | Admitting: Family Medicine

## 2007-05-16 ENCOUNTER — Encounter (INDEPENDENT_AMBULATORY_CARE_PROVIDER_SITE_OTHER): Payer: Self-pay | Admitting: Family Medicine

## 2007-05-17 ENCOUNTER — Telehealth: Payer: Self-pay | Admitting: Family Medicine

## 2007-06-27 ENCOUNTER — Ambulatory Visit: Payer: Self-pay | Admitting: Family Medicine

## 2007-06-27 ENCOUNTER — Encounter (INDEPENDENT_AMBULATORY_CARE_PROVIDER_SITE_OTHER): Payer: Self-pay | Admitting: Family Medicine

## 2007-06-27 LAB — CONVERTED CEMR LAB

## 2007-07-23 ENCOUNTER — Emergency Department (HOSPITAL_COMMUNITY): Admission: EM | Admit: 2007-07-23 | Discharge: 2007-07-23 | Payer: Self-pay | Admitting: Emergency Medicine

## 2007-08-29 ENCOUNTER — Telehealth: Payer: Self-pay | Admitting: *Deleted

## 2007-08-29 ENCOUNTER — Emergency Department (HOSPITAL_COMMUNITY): Admission: EM | Admit: 2007-08-29 | Discharge: 2007-08-29 | Payer: Self-pay | Admitting: Family Medicine

## 2007-09-01 ENCOUNTER — Emergency Department (HOSPITAL_COMMUNITY): Admission: EM | Admit: 2007-09-01 | Discharge: 2007-09-02 | Payer: Self-pay | Admitting: Emergency Medicine

## 2007-09-07 ENCOUNTER — Encounter (INDEPENDENT_AMBULATORY_CARE_PROVIDER_SITE_OTHER): Payer: Self-pay | Admitting: Family Medicine

## 2007-09-21 ENCOUNTER — Encounter: Payer: Self-pay | Admitting: *Deleted

## 2007-09-23 ENCOUNTER — Emergency Department (HOSPITAL_COMMUNITY): Admission: EM | Admit: 2007-09-23 | Discharge: 2007-09-23 | Payer: Self-pay | Admitting: Family Medicine

## 2007-10-10 ENCOUNTER — Encounter: Payer: Self-pay | Admitting: *Deleted

## 2007-10-11 ENCOUNTER — Telehealth (INDEPENDENT_AMBULATORY_CARE_PROVIDER_SITE_OTHER): Payer: Self-pay | Admitting: *Deleted

## 2007-10-16 ENCOUNTER — Ambulatory Visit: Payer: Self-pay | Admitting: Family Medicine

## 2007-10-16 LAB — CONVERTED CEMR LAB: Whiff Test: POSITIVE

## 2007-10-20 ENCOUNTER — Telehealth: Payer: Self-pay | Admitting: *Deleted

## 2007-11-08 ENCOUNTER — Encounter: Payer: Self-pay | Admitting: *Deleted

## 2007-11-24 ENCOUNTER — Emergency Department (HOSPITAL_COMMUNITY): Admission: EM | Admit: 2007-11-24 | Discharge: 2007-11-24 | Payer: Self-pay | Admitting: Family Medicine

## 2008-01-02 ENCOUNTER — Emergency Department (HOSPITAL_COMMUNITY): Admission: EM | Admit: 2008-01-02 | Discharge: 2008-01-02 | Payer: Self-pay | Admitting: Emergency Medicine

## 2008-01-04 ENCOUNTER — Ambulatory Visit: Payer: Self-pay | Admitting: Nurse Practitioner

## 2008-01-26 ENCOUNTER — Telehealth (INDEPENDENT_AMBULATORY_CARE_PROVIDER_SITE_OTHER): Payer: Self-pay | Admitting: Nurse Practitioner

## 2008-02-04 ENCOUNTER — Emergency Department (HOSPITAL_COMMUNITY): Admission: EM | Admit: 2008-02-04 | Discharge: 2008-02-04 | Payer: Self-pay | Admitting: Family Medicine

## 2008-02-27 ENCOUNTER — Encounter (INDEPENDENT_AMBULATORY_CARE_PROVIDER_SITE_OTHER): Payer: Self-pay | Admitting: Nurse Practitioner

## 2008-04-09 ENCOUNTER — Encounter (INDEPENDENT_AMBULATORY_CARE_PROVIDER_SITE_OTHER): Payer: Self-pay | Admitting: Family Medicine

## 2008-05-13 ENCOUNTER — Telehealth (INDEPENDENT_AMBULATORY_CARE_PROVIDER_SITE_OTHER): Payer: Self-pay | Admitting: Family Medicine

## 2008-07-10 ENCOUNTER — Telehealth (INDEPENDENT_AMBULATORY_CARE_PROVIDER_SITE_OTHER): Payer: Self-pay | Admitting: *Deleted

## 2008-07-11 ENCOUNTER — Ambulatory Visit: Payer: Self-pay | Admitting: Family Medicine

## 2008-07-11 ENCOUNTER — Encounter: Payer: Self-pay | Admitting: Family Medicine

## 2008-07-11 LAB — CONVERTED CEMR LAB
GC Probe Amp, Genital: NEGATIVE
Hepatitis B Surface Ag: NEGATIVE
KOH Prep: NEGATIVE

## 2008-07-12 ENCOUNTER — Telehealth: Payer: Self-pay | Admitting: *Deleted

## 2008-07-13 ENCOUNTER — Encounter: Payer: Self-pay | Admitting: Family Medicine

## 2008-08-05 ENCOUNTER — Telehealth (INDEPENDENT_AMBULATORY_CARE_PROVIDER_SITE_OTHER): Payer: Self-pay | Admitting: Family Medicine

## 2008-08-06 ENCOUNTER — Encounter (INDEPENDENT_AMBULATORY_CARE_PROVIDER_SITE_OTHER): Payer: Self-pay | Admitting: Family Medicine

## 2008-09-25 ENCOUNTER — Emergency Department (HOSPITAL_COMMUNITY): Admission: EM | Admit: 2008-09-25 | Discharge: 2008-09-25 | Payer: Self-pay | Admitting: Family Medicine

## 2008-11-27 ENCOUNTER — Encounter (INDEPENDENT_AMBULATORY_CARE_PROVIDER_SITE_OTHER): Payer: Self-pay | Admitting: Nurse Practitioner

## 2008-11-27 ENCOUNTER — Emergency Department (HOSPITAL_COMMUNITY): Admission: EM | Admit: 2008-11-27 | Discharge: 2008-11-27 | Payer: Self-pay | Admitting: Emergency Medicine

## 2009-01-03 ENCOUNTER — Emergency Department (HOSPITAL_COMMUNITY): Admission: EM | Admit: 2009-01-03 | Discharge: 2009-01-03 | Payer: Self-pay | Admitting: Emergency Medicine

## 2009-01-05 ENCOUNTER — Emergency Department (HOSPITAL_COMMUNITY): Admission: EM | Admit: 2009-01-05 | Discharge: 2009-01-05 | Payer: Self-pay | Admitting: Emergency Medicine

## 2009-02-25 ENCOUNTER — Inpatient Hospital Stay (HOSPITAL_COMMUNITY): Admission: AD | Admit: 2009-02-25 | Discharge: 2009-02-25 | Payer: Self-pay | Admitting: Obstetrics and Gynecology

## 2009-05-13 ENCOUNTER — Emergency Department (HOSPITAL_BASED_OUTPATIENT_CLINIC_OR_DEPARTMENT_OTHER): Admission: EM | Admit: 2009-05-13 | Discharge: 2009-05-13 | Payer: Self-pay | Admitting: Emergency Medicine

## 2009-05-13 ENCOUNTER — Ambulatory Visit: Payer: Self-pay | Admitting: Diagnostic Radiology

## 2009-06-30 ENCOUNTER — Encounter: Payer: Self-pay | Admitting: Family Medicine

## 2009-07-28 ENCOUNTER — Telehealth: Payer: Self-pay | Admitting: Family Medicine

## 2009-08-05 ENCOUNTER — Emergency Department (HOSPITAL_COMMUNITY): Admission: EM | Admit: 2009-08-05 | Discharge: 2009-08-06 | Payer: Self-pay | Admitting: Emergency Medicine

## 2009-09-02 ENCOUNTER — Ambulatory Visit: Payer: Self-pay | Admitting: Family Medicine

## 2009-09-02 ENCOUNTER — Other Ambulatory Visit: Admission: RE | Admit: 2009-09-02 | Discharge: 2009-09-02 | Payer: Self-pay | Admitting: Family Medicine

## 2009-09-02 DIAGNOSIS — D649 Anemia, unspecified: Secondary | ICD-10-CM | POA: Insufficient documentation

## 2009-09-02 LAB — CONVERTED CEMR LAB

## 2009-09-08 ENCOUNTER — Encounter: Payer: Self-pay | Admitting: Family Medicine

## 2009-09-13 ENCOUNTER — Emergency Department (HOSPITAL_COMMUNITY): Admission: EM | Admit: 2009-09-13 | Discharge: 2009-09-13 | Payer: Self-pay | Admitting: Family Medicine

## 2009-10-06 ENCOUNTER — Emergency Department (HOSPITAL_COMMUNITY): Admission: EM | Admit: 2009-10-06 | Discharge: 2009-10-06 | Payer: Self-pay | Admitting: Emergency Medicine

## 2009-11-06 ENCOUNTER — Encounter: Payer: Self-pay | Admitting: Family Medicine

## 2009-11-06 ENCOUNTER — Ambulatory Visit: Payer: Self-pay | Admitting: Family Medicine

## 2009-11-08 LAB — CONVERTED CEMR LAB
Albumin: 4 g/dL (ref 3.5–5.2)
BUN: 13 mg/dL (ref 6–23)
CO2: 24 meq/L (ref 19–32)
Cholesterol: 167 mg/dL (ref 0–200)
Glucose, Bld: 85 mg/dL (ref 70–99)
HDL: 44 mg/dL (ref >39)
Hemoglobin: 12.1 g/dL (ref 12.0–15.0)
MCV: 65.8 fL — ABNORMAL LOW (ref 78.0–100.0)
Potassium: 4.7 meq/L (ref 3.5–5.3)
RBC: 5.85 M/uL — ABNORMAL HIGH (ref 3.87–5.11)
Sodium: 141 meq/L (ref 135–145)
Total Bilirubin: 0.5 mg/dL (ref 0.3–1.2)
Total Protein: 6.3 g/dL (ref 6.0–8.3)
Triglycerides: 114 mg/dL (ref <150)
WBC: 6.7 10*3/uL (ref 4.0–10.5)

## 2009-11-10 ENCOUNTER — Telehealth: Payer: Self-pay | Admitting: Family Medicine

## 2009-11-11 ENCOUNTER — Telehealth: Payer: Self-pay | Admitting: Family Medicine

## 2009-11-11 ENCOUNTER — Encounter: Admission: RE | Admit: 2009-11-11 | Discharge: 2009-11-11 | Payer: Self-pay | Admitting: Family Medicine

## 2009-11-17 ENCOUNTER — Telehealth: Payer: Self-pay | Admitting: *Deleted

## 2010-01-01 ENCOUNTER — Encounter: Payer: Self-pay | Admitting: Family Medicine

## 2010-01-04 ENCOUNTER — Telehealth: Payer: Self-pay | Admitting: Family Medicine

## 2010-01-07 ENCOUNTER — Emergency Department (HOSPITAL_COMMUNITY): Admission: EM | Admit: 2010-01-07 | Discharge: 2010-01-07 | Payer: Self-pay | Admitting: Family Medicine

## 2010-01-27 ENCOUNTER — Encounter: Payer: Self-pay | Admitting: Family Medicine

## 2010-01-27 ENCOUNTER — Ambulatory Visit: Payer: Self-pay | Admitting: Family Medicine

## 2010-01-27 ENCOUNTER — Telehealth: Payer: Self-pay | Admitting: Family Medicine

## 2010-01-27 LAB — CONVERTED CEMR LAB
Bilirubin Urine: NEGATIVE
Blood in Urine, dipstick: NEGATIVE
Glucose, Urine, Semiquant: NEGATIVE
Protein, U semiquant: NEGATIVE
Whiff Test: NEGATIVE
pH: 6

## 2010-01-28 ENCOUNTER — Telehealth (INDEPENDENT_AMBULATORY_CARE_PROVIDER_SITE_OTHER): Payer: Self-pay | Admitting: *Deleted

## 2010-01-28 LAB — CONVERTED CEMR LAB
Prolactin: 8 ng/mL
hCG, Beta Chain, Quant, S: <2 milliintl units/mL

## 2010-02-03 ENCOUNTER — Telehealth: Payer: Self-pay | Admitting: Family Medicine

## 2010-02-14 ENCOUNTER — Emergency Department (HOSPITAL_COMMUNITY): Admission: EM | Admit: 2010-02-14 | Discharge: 2010-02-14 | Payer: Self-pay | Admitting: Emergency Medicine

## 2010-02-16 ENCOUNTER — Telehealth: Payer: Self-pay | Admitting: *Deleted

## 2010-02-23 ENCOUNTER — Ambulatory Visit: Payer: Self-pay | Admitting: Family Medicine

## 2010-02-23 ENCOUNTER — Telehealth: Payer: Self-pay | Admitting: *Deleted

## 2010-02-23 ENCOUNTER — Encounter: Payer: Self-pay | Admitting: Family Medicine

## 2010-02-23 DIAGNOSIS — N760 Acute vaginitis: Secondary | ICD-10-CM | POA: Insufficient documentation

## 2010-02-23 DIAGNOSIS — F17200 Nicotine dependence, unspecified, uncomplicated: Secondary | ICD-10-CM | POA: Insufficient documentation

## 2010-02-23 LAB — CONVERTED CEMR LAB
Bilirubin Urine: NEGATIVE
Chlamydia, DNA Probe: NEGATIVE
GC Probe Amp, Genital: NEGATIVE
Protein, U semiquant: NEGATIVE
Specific Gravity, Urine: 1.025
pH: 7

## 2010-02-24 ENCOUNTER — Encounter: Payer: Self-pay | Admitting: *Deleted

## 2010-03-31 ENCOUNTER — Ambulatory Visit: Admission: RE | Admit: 2010-03-31 | Discharge: 2010-03-31 | Payer: Self-pay | Source: Home / Self Care

## 2010-04-01 ENCOUNTER — Emergency Department (HOSPITAL_COMMUNITY)
Admission: EM | Admit: 2010-04-01 | Discharge: 2010-04-01 | Payer: Self-pay | Source: Home / Self Care | Admitting: Emergency Medicine

## 2010-04-06 LAB — CBC
HCT: 38.8 % (ref 36.0–46.0)
Hemoglobin: 12.6 g/dL (ref 12.0–15.0)
MCH: 21 pg — ABNORMAL LOW (ref 26.0–34.0)
MCHC: 32.5 g/dL (ref 30.0–36.0)
MCV: 64.6 fL — ABNORMAL LOW (ref 78.0–100.0)
Platelets: 196 10*3/uL (ref 150–400)
RBC: 6.01 MIL/uL — ABNORMAL HIGH (ref 3.87–5.11)
RDW: 15.5 % (ref 11.5–15.5)
WBC: 9.8 10*3/uL (ref 4.0–10.5)

## 2010-04-06 LAB — DIFFERENTIAL
Basophils Absolute: 0 10*3/uL (ref 0.0–0.1)
Basophils Relative: 0 % (ref 0–1)
Eosinophils Absolute: 0.1 10*3/uL (ref 0.0–0.7)
Eosinophils Relative: 1 % (ref 0–5)
Lymphocytes Relative: 51 % — ABNORMAL HIGH (ref 12–46)
Lymphs Abs: 5 10*3/uL — ABNORMAL HIGH (ref 0.7–4.0)
Monocytes Absolute: 0.5 10*3/uL (ref 0.1–1.0)
Monocytes Relative: 5 % (ref 3–12)
Neutro Abs: 4.2 10*3/uL (ref 1.7–7.7)
Neutrophils Relative %: 43 % (ref 43–77)

## 2010-04-06 LAB — URINALYSIS, ROUTINE W REFLEX MICROSCOPIC
Bilirubin Urine: NEGATIVE
Hgb urine dipstick: NEGATIVE
Ketones, ur: NEGATIVE mg/dL
Nitrite: NEGATIVE
Protein, ur: NEGATIVE mg/dL
Specific Gravity, Urine: 1.027 (ref 1.005–1.030)
Urine Glucose, Fasting: NEGATIVE mg/dL
Urobilinogen, UA: 1 mg/dL (ref 0.0–1.0)
pH: 6 (ref 5.0–8.0)

## 2010-04-06 LAB — POCT PREGNANCY, URINE: Preg Test, Ur: NEGATIVE

## 2010-04-20 ENCOUNTER — Telehealth: Payer: Self-pay | Admitting: Family Medicine

## 2010-04-20 ENCOUNTER — Emergency Department (HOSPITAL_COMMUNITY)
Admission: EM | Admit: 2010-04-20 | Discharge: 2010-04-20 | Payer: Self-pay | Source: Home / Self Care | Admitting: Emergency Medicine

## 2010-04-21 ENCOUNTER — Ambulatory Visit: Admission: RE | Admit: 2010-04-21 | Discharge: 2010-04-21 | Payer: Self-pay | Source: Home / Self Care

## 2010-04-21 DIAGNOSIS — S335XXA Sprain of ligaments of lumbar spine, initial encounter: Secondary | ICD-10-CM

## 2010-04-21 DIAGNOSIS — S339XXA Sprain of unspecified parts of lumbar spine and pelvis, initial encounter: Secondary | ICD-10-CM | POA: Insufficient documentation

## 2010-04-21 NOTE — Progress Notes (Signed)
Summary: triage  Phone Note Call from Patient Call back at [phone]   Caller: Patient Summary of Call: is concerned about her BP being too low - not sure what is going on. Initial call taken by: De Nurse,  February 16, 2010 1:50 PM  Follow-up for Phone Call        Returned call- spoke with patient mother at the number pt left stating pt is at work.  Advised to please ask pt to call back. Follow-up by: Rochele Pages RN,  February 16, 2010 3:00 PM  Additional Follow-up for Phone Call Additional follow up Details #1::        Spoke with pt's mother again- she states pt is still at work, and there is no number to reach her on there.  She asked that someone please call pt back in the morning.  Advised triage nurse will call back tomorrow am. Additional Follow-up by: Rochele Pages RN,  February 16, 2010 4:51 PM    Additional Follow-up for Phone Call Additional follow up Details #2::    pt is calling again and needs for nurse to call  Additional Follow-up for Phone Call Additional follow up Details #3:: Details for Additional Follow-up Action Taken: Returned call, no answer.  LVMM to call us back. Additional Follow-up by: Dennison Nancy RN,  February 17, 2010 10:23 AM

## 2010-04-21 NOTE — Progress Notes (Signed)
Summary: meds prob  Phone Note Call from Patient Call back at [phone]   Caller: Patient Summary of Call: states the [facility] did not get meds from 8/18 visit - pls let pt know Initial call taken by: De Nurse,  November 17, 2009 11:16 AM  Follow-up for Phone Call        I called them in & lm for pt that this had been dealt with Follow-up by: Golden Circle RN,  November 17, 2009 3:00 PM

## 2010-04-21 NOTE — Progress Notes (Signed)
Summary: phn msg  Phone Note Call from Patient   Caller: Patient Summary of Call: husband cell# [phone] she thought she gave you wrong number Initial call taken by: De Nurse,  January 27, 2010 4:09 PM     Appended Document: phn msg Spoke with pt given lab results, neg Beta HCG, I told her I think stress is playing into this as well, her wet prep was also neg. Pt willing to come in to dicuss her anxiety and stress, has long standing history asked for Xanax, told her I would give a few tablets she must come in to be seen, will prescribed 8 tabs
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
## 2010-04-21 NOTE — Miscellaneous (Signed)
Summary: re: labs/waiting for pt to call back/ts  called pt and left message with husband to call back. Please tell pt, that her STD checks were negative and to f/up with Dr.Durand for her depression.Arlyss Repress CMA,  February 24, 2010 5:02 PM informed pt and pt will sched. f/up appt with dr.Admire..Thekla Lorenda Hatchet CMA,  February 25, 2010 10:51 AM

## 2010-04-21 NOTE — Progress Notes (Signed)
Summary: triage  Phone Note Call from Patient Call back at [phone]   Caller: Patient Summary of Call: Pt wanting to get refill on birth control that Dr. Luz Brazen gave her and is waiting on Medicaid to change back here.  Very worried also because she may need plan b now due to having intercourse yesterday. Initial call taken by: Clydell Hakim,  Jul 28, 2009 9:57 AM  Follow-up for Phone Call        pt returned call Follow-up by: Golden Circle RN,  Jul 28, 2009 10:00 AM  Additional Follow-up for Phone Call Additional follow up Details #1::        she has just applied for family planning medicaid. has no money for a visit. [facility] call a pharmacy & get the price & the them. they may be cheaper. the health department will give OCPs for free. make appt here when you get the medicaid.  get the plan B today!!! offered her condoms. told her best to use spermicide & condom to make sure unwanted pregnancy dos not occur Additional Follow-up by: Golden Circle RN,  Jul 28, 2009 10:15 AM

## 2010-04-21 NOTE — Progress Notes (Signed)
Summary: re: labs/ts  ---- Converted from flag ---- ---- 02/23/2010 3:06 PM, Milinda Antis MD wrote: Please let pt know her urine test was normal, no evidence of infection and no protein Her wet prep showed bacterial vaginosis which isnot sexually transmitted I am going to treat her with Flagyl 1 tab twice a day for 7 days Do not drink alcohol with this medication. The gonorrhea swab takes 24 hours  Phone [phone] ------------------------------ called pt. informed of above. pt verbalized understanding.

## 2010-04-21 NOTE — Progress Notes (Signed)
  Phone Note Call from Patient   Summary of Call: Patient calling b/c she is having some breast discharge which she can express from her breasts.  Describes as clear discharge, no blood, no pain, no lumps or bumps noticed.  [facility] went to [facility] and was told she was fine.  Breasts were sore today, which was reason for her squeezing breasts.  Is on menstrual cycle, started today.  Last month cycle only lasted 3 days.  Has had regular menstrual cycle every 30 days, she is due today for her cycle to start.  States she missed several pills in a row this month.  Took a pregnancy test at home which was negative.  Has two children at home, son is 58 and daughter 2.  Never breast fed.  Denies any abdominal pain, cramping.  After discussing plan with patient, this is most likely secondary to patient just starting menstrual cycle today.  Stated she can make an appt for pregnancy test if she is concerned.  Patient very much relieved after conversation, does not know if she'll make appt.   Initial call taken by: Renold Don MD,  January 04, 2010 9:28 PM
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
## 2010-04-21 NOTE — Progress Notes (Signed)
     Follow-up for Phone Call       Follow-up by: Milinda Antis MD,  November 11, 2009 1:53 PM    Additional Follow-up for Phone Call Additional follow up Details #2::    Arline Asp with the breast center [phone]) states Dr. Mayford Knife could not find any masses by exam or ultrasound. he told pt she was ok & to return for noraml screening mamogram next yr. md had wanted to know where out md found lump as he could not. read her portions of last OV notes Follow-up by: Golden Circle RN,  November 11, 2009 12:20 PM  Additional Follow-up for Phone Call Additional follow up Details #3:: Details for Additional Follow-up Action Taken: It was the left breast, if you divide it into quadrants it was in the right upper quandrant Additional Follow-up by: Milinda Antis MD,  November 11, 2009 1:54 PM  called & spoke with [place]. gave her above info.Golden Circle RN  November 12, 2009 9:09 AM

## 2010-04-21 NOTE — Miscellaneous (Signed)
Summary: Medicaid Soc Services Form   Patient dropped off form to be filled out for [facility].  Please fax when completed. Bradly Bienenstock  June 30, 2009 10:04 AM  form to Dr. Jeanice Lim to review & sign. then pt can be called to come pick it up.Golden Circle RN  June 30, 2009 10:11 AM  There is nothing for me to review and sign. Pt can come get the paperwork  [facility] MD  June 30, 2009 1:42 PM

## 2010-04-21 NOTE — Miscellaneous (Signed)
Summary: Denied refill for Xanax, needs a f/u appt  Clinical Lists Changes

## 2010-04-21 NOTE — Progress Notes (Signed)
----   Converted from flag ---- ---- 01/28/2010 8:07 AM, Milinda Antis MD wrote: please let pt know the remainder of her labs were normal, I will see her in 2 weeks as we discussed last night ------------------------------  Pt. contacted and informed.  Starleen Blue RN 01/28/2010

## 2010-04-21 NOTE — Progress Notes (Signed)
Summary: re: labs/see message/ts  Phone Note Call from Patient Call back at [phone]   Caller: Patient Summary of Call: has a questions about lab results Initial call taken by: De Nurse,  November 10, 2009 11:51 AM  Follow-up for Phone Call        called pt and went over her labs. pt told me, that dr.Juda told her to re-check her labs in november. she will do that. pt c/o yeast infection and request DIFLUCAN to be sent to her pharmacy. pt had tooth extracted 2 weeks ago and was on ABX and developed yeast infection. fwd. to Dr.[place] to address. Follow-up by: Arlyss Repress CMA,,  November 10, 2009 1:58 PM    New/Updated Medications: FLUCONAZOLE 150 MG TABS (FLUCONAZOLE) 1 by mouth x 1 for yeast infection Prescriptions: FLUCONAZOLE 150 MG TABS (FLUCONAZOLE) 1 by mouth x 1 for yeast infection  #1 x 1   Entered and Authorized by:   Milinda Antis MD   Signed by:   Milinda Antis MD on 11/10/2009   Method used:   Electronically to        [facility] (retail)       [address]       [place], [place]  [address]       Ph: [phone]       Fax: [phone]   RxID:   [id number] FLUCONAZOLE 150 MG TABS (FLUCONAZOLE) 1 by mouth x 1 for yeast infection  #1 x 1   Entered and Authorized by:   Milinda Antis MD   Signed by:   Milinda Antis MD on 11/10/2009   Method used:   Electronically to        [facility] [id number] (retail)       [address] [place], [place]  [address]       Ph: [phone]       Fax: [phone]   RxID:   [id number]

## 2010-04-21 NOTE — Letter (Signed)
Summary: Results Follow-up Letter  [facility]  [address]   [place], [place] [address]   Phone: [phone]  Fax: [phone]    09/08/2009  [address] [place], [place]  [address]  Dear Ms. Tori Milks,   The following are the results of your recent test(s):  Test     Result     Pap Smear    Normal____X___  Not Normal_____       Comments: I would repeat a pap smear yearly until we have three normal results before spacing out your paps as you have history of abnormal paps in the past.    _________________________________________________________  Please call with questions.  Sincerely,  Eustaquio Boyden  MD [facility]           Appended Document: Results Follow-up Letter mailed

## 2010-04-21 NOTE — Progress Notes (Signed)
Summary: requesting test results  Medications Added TERAZOL 3 0.8 %  CREA (TERCONAZOLE) apply at bedtime x 3 days       Phone Note Call from Patient Call back at Home Phone [phone]   Reason for Call: Talk to Nurse Summary of Call: pt is requesting her results from the wet prep today Initial call taken by: ERIN LEVAN,  November 02, 2006 1:32 PM  Follow-up for Phone Call        RN spoke w/ patient, told her of dx.  Rx for terazol called into pharmacy. Follow-up by: Benn Moulder MD,  November 02, 2006 3:30 PM    New/Updated Medications: TERAZOL 3 0.8 %  CREA (TERCONAZOLE) apply at bedtime x 3 days

## 2010-04-21 NOTE — Progress Notes (Signed)
  Phone Note Call from Patient   Caller: Patient Call For: [phone] Summary of Call: Pt having palpitations.  Is concerned and want to speak with a nurse regarding this. Initial call taken by: Abundio Miu,  February 03, 2010 12:15 PM  Follow-up for Phone Call        Pt states for over the past month she has been experiencing isolated episodes of what she describes at palpatations.  Says her  heart feels like it is fluttering, she gets sloghtly SOB then it goes away.  No associated pain, diaphoresis or radiating symptoms.  It scares her when it happens.   Pt does have a h/o of anxiety and says that she has been stressed lately d/t personal issues.  She does not drink coffee but does take Excedrin for pain.  Advised her to take an Alprazolam the next time this happens to see if it helps.  She says she only has 2 of the pills left.  She has an appt on the 23rd.  Told her I would ask her PCP if she would call in enough pills to last her unitl her next appt and that I would call her back.  Follow-up by: Dennison Nancy RN,  February 03, 2010 1:27 PM  Additional Follow-up for Phone Call Additional follow up Details #1::        Pt needs to come in for an appt for her anxiety. I will give  a few more pills, now but none further until pt is seen to discuss this again Additional Follow-up by: Milinda Antis MD,  February 03, 2010 1:44 PM    Additional Follow-up for Phone Call Additional follow up Details #2::    Called and spoke with pt's mom.  Told her that Dr. Jeanice Lim would call in enough pills to last her until her appt on  the 23rd.  Verified that she uses the [facility] [address].   Follow-up by: Dennison Nancy RN,  February 03, 2010 4:46 PM  Prescriptions: ALPRAZOLAM 0.5 MG TABS (ALPRAZOLAM) 1 by mouth two times a day as needed anxiety  #20 x 0   Entered and Authorized by:   Milinda Antis MD   Signed by:   Milinda Antis MD on 02/03/2010   Method used:   Telephoned to ...       [facility] [address] (retail)       [phone]       [place], [place]  [address]       Ph: [phone]       Fax: [phone]   RxID:   [id number]
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
## 2010-04-27 ENCOUNTER — Inpatient Hospital Stay (INDEPENDENT_AMBULATORY_CARE_PROVIDER_SITE_OTHER)
Admission: RE | Admit: 2010-04-27 | Discharge: 2010-04-27 | Disposition: A | Discharge status: Home / Self Care | Payer: Self-pay | Source: Ambulatory Visit | Attending: Family Medicine | Admitting: Family Medicine

## 2010-04-27 DIAGNOSIS — J020 Streptococcal pharyngitis: Secondary | ICD-10-CM

## 2010-04-29 NOTE — Assessment & Plan Note (Signed)
Summary: Heidi [name]   Vital Signs:  Patient profile:   30 year old female Height:      64 inches Weight:      188 pounds BMI:     32.39 Pulse rate:   93 / minute BP sitting:   138 / 86  (left arm) Cuff size:   regular  Vitals Entered By: Tessie Fass CMA (April 21, 2010 9:20 AM) CC: lower back pain x 1 day Pain Assessment Patient in pain? yes     Location: lower back Intensity: 10   Primary Care Provider:  [facility]  CC:  lower back pain x 1 day.  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 No change in bowel or bladder  Anxiety- feels a little better, did not take Zoloft, has an appt scheduled for a personal couselor next week. Taking Xanax twice a day, trying to use relaxation techniques, things still difficult with husband and mother regarding her alternative lifestyle  Current Medications (verified): 1)  Zovirax 400 Mg Tabs (Acyclovir) .Marland Kitchen.. 1 Tab By Mouth Three Times A Day X 5 Days 2)  Alprazolam 0.5 Mg Tabs (Alprazolam) .Marland Kitchen.. 1 By Mouth Two Times A Day As Needed Anxiety 3)  Flexeril 10 Mg Tabs (Cyclobenzaprine Hcl) .Marland Kitchen.. 1 By Mouth Three Times A Day As Needed Spasm 4)  Percocet 5-325 Mg Tabs (Oxycodone-Acetaminophen) .Marland Kitchen.. 1 By Mouth Q 6hours As Needed Pain 5)  Ibuprofen 600 Mg Tabs (Ibuprofen) .Marland Kitchen.. 1 By Mouth Q 6hours As Needed Pain  Allergies (verified): No Known Drug Allergies  Review of Systems       per HPI  Physical Exam  General:  NAD, overweight Vital signs noted  Msk:  TTP over lumbar region thoracic and cervical spine non tender normal ROM of neck +spasm noted in paraspinals neg SLR pain flexion , pain with lateral  rotation Pulses:  2+ Extremities:  no edema Neurologic:  slow antaglic gait sensation in tact Motor in tact bilat DTR  symmetric bilat Psych:  Oriented X3 and memory intact for recent and remote.  Smiling today, did not cry during exam   Impression & Recommendations:  Problem # 1:  LUMBOSACRAL STRAIN (ICD-846.0) Assessment New secondary to heavy lifiting, given restrictions from the ER has a work note, will d/c the valium and start flexeril at bedtime, heating pad and exercies given as tolerated. Perocet short term, told to use NSAIDS during the day Orders: [facility]- Est Level  3 (16109)  Problem # 2:  ANXIETY (ICD-300.00) Assessment: Unchanged Pt not taking Zoloft, this is a difficult situation she wants help but does not like meds, will continue on xanax, f/u with counselor and myself routinely, no SI The following medications were removed from the medication list:    Zoloft 50 Mg Tabs (Sertraline hcl) .Marland Kitchen... 1 by mouth daily for anxiety Her updated medication list for this problem includes:    Alprazolam 0.5 Mg Tabs (Alprazolam) .Marland Kitchen... 1 by mouth two times a day as needed anxiety  Orders: [facility]- Est Level  3 (99213)  Complete Medication List: 1)  Zovirax 400 Mg Tabs (Acyclovir) .Marland Kitchen.. 1 tab by mouth three times a day x 5 days 2)  Alprazolam 0.5 Mg Tabs (Alprazolam) .Marland Kitchen.. 1 by mouth two times a day as needed  anxiety 3)  Flexeril 10 Mg Tabs (Cyclobenzaprine hcl) .Marland Kitchen.. 1 by mouth three times a day as needed spasm 4)  Percocet 5-325 Mg Tabs (Oxycodone-acetaminophen) .Marland Kitchen.. 1 by mouth q 6hours as needed pain 5)  Ibuprofen 600 Mg Tabs (Ibuprofen) .Marland Kitchen.. 1 by mouth q 6hours as needed pain  Patient Instructions: 1)  Use the ibuprofen while at work 2)  Take the flexeril at nght  3)  Take the Percocet as night as needed for severe pain 4)  Try the exercises  5)  Next visit in 3 weeks to follow-up your back  Prescriptions: IBUPROFEN 600 MG TABS (IBUPROFEN) 1 by mouth Q 6hours as needed pain  #40 x 0   Entered and Authorized by:   Milinda Antis MD   Signed by:   Milinda Antis MD on 04/22/2010   Method used:    Historical   RxID:   [id number] PERCOCET 5-325 MG TABS (OXYCODONE-ACETAMINOPHEN) 1 by mouth q 6hours as needed pain  #20 x 0   Entered and Authorized by:   Milinda Antis MD   Signed by:   Milinda Antis MD on 04/22/2010   Method used:   Historical   RxID:   [id number] FLEXERIL 10 MG TABS (CYCLOBENZAPRINE HCL) 1 by mouth three times a day as needed spasm  #60 x 0   Entered and Authorized by:   Milinda Antis MD   Signed by:   Milinda Antis MD on 04/21/2010   Method used:   Print then Give to Patient   RxID:   [id number]    Orders Added: 1)  [facility]- Est Level  3 [01027]

## 2010-04-29 NOTE — Progress Notes (Signed)
Summary: Leg pain   Phone Note Call from Patient   Caller: Patient Details for Reason: leg pain  Summary of Call: Recieved call for pt about leg pain. Pt self reports hx/o significant anxiety. Pt states that she works at [employer], works on Health visitor all day. Wears no soled shoes/slippiers.  Pt noticed R foot/calf pain after work today. Was concerned this might be blood clot. Pt denies hx.o blood clot in the past. No calf/popliteal tenderness/swelling. No hx/o prlonged activity. Not on OCPs. Quit smoking 2 months ago. Told pt overall hx not concerning for DVT. Discussed red flags including calf swelling/tenderness, dyspnea, CP. Avised pt to where cushioned sneakers and to remain well hydrated. Pt agreeable to plan.  Doree Albee MD April 20, 2010 2:11 AM      Appended Document: Leg pain  Patient called because she was seen in the ER with back pain - given morphine in ER - diagnosed with lumbosacral strain (see ER report) - she reports that the morphine is making her jittery and anxious - she wants to know "how long the morphine will be in her system". Advised regarding the half life of moprhine. Advised follow up with PCP for back pain and other issues. Advised regarding back pain red flags that would prompt return to care to ER visit. Patient expressed understanding

## 2010-05-01 LAB — HM DEXA SCAN

## 2010-05-16 ENCOUNTER — Inpatient Hospital Stay (INDEPENDENT_AMBULATORY_CARE_PROVIDER_SITE_OTHER)
Admission: RE | Admit: 2010-05-16 | Discharge: 2010-05-16 | Disposition: A | Discharge status: Home / Self Care | Payer: Medicaid Other | Source: Ambulatory Visit | Attending: Family Medicine | Admitting: Family Medicine

## 2010-05-16 DIAGNOSIS — R51 Headache: Secondary | ICD-10-CM

## 2010-05-22 ENCOUNTER — Ambulatory Visit (INDEPENDENT_AMBULATORY_CARE_PROVIDER_SITE_OTHER): Payer: Medicaid Other | Admitting: Family Medicine

## 2010-05-22 ENCOUNTER — Encounter: Payer: Self-pay | Admitting: Family Medicine

## 2010-05-22 VITALS — BP 120/81 | HR 80 | Temp 98.5°F | Ht 63.0 in | Wt 193.0 lb

## 2010-05-22 DIAGNOSIS — K219 Gastro-esophageal reflux disease without esophagitis: Secondary | ICD-10-CM | POA: Insufficient documentation

## 2010-05-22 DIAGNOSIS — F411 Generalized anxiety disorder: Secondary | ICD-10-CM

## 2010-05-22 MED ORDER — ALPRAZOLAM 0.5 MG PO TABS: 0.5000 mg | ORAL_TABLET | Freq: Two times a day (BID) | ORAL | Status: DC | PRN

## 2010-05-22 MED ORDER — PANTOPRAZOLE SODIUM 40 MG PO TBEC: 40.0000 mg | DELAYED_RELEASE_TABLET | Freq: Every day | ORAL | Status: DC
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
## 2010-05-22 NOTE — Progress Notes (Signed)
  Subjective:    Patient ID: Heidi [name], female    DOB: 09-30-1980, 30 y.o.   MRN: [id number]  HPI Pt seen at urgent care for sinus pressure 2/25 prescribed Flonase which has helped a lot        GERD- history of acid reflux, has many bouts of reflux after eating, worse when she is stressed, previously had omeprazole and protonix, protonix worked best, would like to restart this, occ has globus feeling in her throat         Anxiety/Depression-- Pt missed her appt with her therapist- she is too reschedule today since her Meidcaid was recertified                        Since our last visit she was kicked out of her  Mother's home and states she was chastised by her mothers pastor for her same sex relationship and he told her she was not a good mother and she would go to [place] like her brother (deceased). She moved to a hotel in an area known for drug dealers but states this is all she can afford. Her girlfriend moved in with her and her GF son is there,, she knows her GF has been using drugs recently and will likely fail her probation drug screen and may go back to jail.Marland Kitchen She was excited about her daughter birthday and she had a party which she worried initially her husband would be against, however during the party she had relations with her husband therefore cheating on her GF and since then has had increased anxiety, sadness and crying spells. She is not being truthful to either her husband or her GF and feels she cant escape States she feels like giving up at times, but has no plan to hurt herself Her children make her happy and want to succeed. She continues to work at [employer] and is being seen by their physician for her back injury on the job. She asked for the # to a new PT.  Work is stressful because a co-worker of hers committed suicide a few days ago She is still working on her classes needed to finish school as well.     Review of Systems     Objective:   Physical  Exam     GEN- NAD, crying throughout visit     PSYCH- depressed appearing, crying, no apparent hallucinations, flat affect, occ smiles at jokes or when discussing her children       Assessment & Plan:  Approx 25 minutes spent on entire visit Contracted verbally for safety

## 2010-05-22 NOTE — Assessment & Plan Note (Signed)
Restart PPI

## 2010-05-22 NOTE — Patient Instructions (Signed)
Remember you are a very special person and you have a lot to live for! Call the clinic if you are worried or need someone to talk things through Please call you therapist and reschedule when you leave here Continue the xanax I have sent protonix Next visit in 1 month

## 2010-06-03 LAB — URINE CULTURE

## 2010-06-08 LAB — URINALYSIS, ROUTINE W REFLEX MICROSCOPIC
Ketones, ur: NEGATIVE mg/dL
Nitrite: NEGATIVE
Protein, ur: NEGATIVE mg/dL
Specific Gravity, Urine: 1.028 (ref 1.005–1.030)
Urobilinogen, UA: 1 mg/dL (ref 0.0–1.0)

## 2010-06-08 LAB — URINE MICROSCOPIC-ADD ON

## 2010-06-08 LAB — URINE CULTURE: Colony Count: 30000

## 2010-06-08 LAB — WET PREP, GENITAL: Yeast Wet Prep HPF POC: NONE SEEN

## 2010-06-08 LAB — GC/CHLAMYDIA PROBE AMP, GENITAL: Chlamydia, DNA Probe: NEGATIVE

## 2010-06-08 LAB — POCT PREGNANCY, URINE: Preg Test, Ur: NEGATIVE

## 2010-06-09 ENCOUNTER — Ambulatory Visit: Payer: Medicaid Other | Admitting: Family Medicine

## 2010-06-10 ENCOUNTER — Inpatient Hospital Stay (INDEPENDENT_AMBULATORY_CARE_PROVIDER_SITE_OTHER)
Admission: RE | Admit: 2010-06-10 | Discharge: 2010-06-10 | Disposition: A | Discharge status: Home / Self Care | Payer: Self-pay | Source: Ambulatory Visit | Attending: Emergency Medicine | Admitting: Emergency Medicine

## 2010-06-10 ENCOUNTER — Telehealth: Payer: Self-pay | Admitting: Family Medicine

## 2010-06-10 DIAGNOSIS — M545 Low back pain: Secondary | ICD-10-CM

## 2010-06-10 DIAGNOSIS — N760 Acute vaginitis: Secondary | ICD-10-CM

## 2010-06-10 DIAGNOSIS — M25519 Pain in unspecified shoulder: Secondary | ICD-10-CM

## 2010-06-10 DIAGNOSIS — F411 Generalized anxiety disorder: Secondary | ICD-10-CM

## 2010-06-10 LAB — WET PREP, GENITAL

## 2010-06-10 NOTE — Telephone Encounter (Signed)
When I called patient back she was already at the [facility].  Told her I would let her PCP know she was there today so they could discuss at Friday's appointment.

## 2010-06-10 NOTE — Telephone Encounter (Signed)
Having arm pain and also burning sensation in feet.  Is worried something is wrong.  Has appt on Friday w/ PCP

## 2010-06-11 LAB — GC/CHLAMYDIA PROBE AMP, GENITAL
Chlamydia, DNA Probe: NEGATIVE
GC Probe Amp, Genital: NEGATIVE

## 2010-06-12 ENCOUNTER — Encounter: Payer: Self-pay | Admitting: Family Medicine

## 2010-06-12 ENCOUNTER — Ambulatory Visit (INDEPENDENT_AMBULATORY_CARE_PROVIDER_SITE_OTHER): Payer: Medicaid Other | Admitting: Family Medicine

## 2010-06-12 VITALS — BP 120/80 | Temp 98.6°F | Wt 195.0 lb

## 2010-06-12 DIAGNOSIS — F329 Major depressive disorder, single episode, unspecified: Secondary | ICD-10-CM

## 2010-06-12 DIAGNOSIS — F3289 Other specified depressive episodes: Secondary | ICD-10-CM

## 2010-06-12 DIAGNOSIS — F411 Generalized anxiety disorder: Secondary | ICD-10-CM

## 2010-06-12 DIAGNOSIS — M25512 Pain in left shoulder: Secondary | ICD-10-CM

## 2010-06-12 DIAGNOSIS — N760 Acute vaginitis: Secondary | ICD-10-CM

## 2010-06-12 DIAGNOSIS — M25519 Pain in unspecified shoulder: Secondary | ICD-10-CM

## 2010-06-12 LAB — BASIC METABOLIC PANEL
CO2: 30 mEq/L (ref 19–32)
Calcium: 8.8 mg/dL (ref 8.4–10.5)
Creatinine, Ser: 0.9 mg/dL (ref 0.4–1.2)
GFR calc Af Amer: >60 mL/min (ref >60)
GFR calc non Af Amer: >60 mL/min (ref >60)
Sodium: 145 mEq/L (ref 135–145)

## 2010-06-12 LAB — DIFFERENTIAL
Basophils Relative: 1 % (ref 0–1)
Eosinophils Absolute: 0.1 10*3/uL (ref 0.0–0.7)
Eosinophils Relative: 1 % (ref 0–5)
Lymphocytes Relative: 40 % (ref 12–46)
Monocytes Absolute: 0.5 10*3/uL (ref 0.1–1.0)
Neutrophils Relative %: 51 % (ref 43–77)

## 2010-06-12 LAB — CBC
Hemoglobin: 13.1 g/dL (ref 12.0–15.0)
MCHC: 31.5 g/dL (ref 30.0–36.0)
RBC: 6.25 MIL/uL — ABNORMAL HIGH (ref 3.87–5.11)

## 2010-06-12 LAB — D-DIMER, QUANTITATIVE: D-Dimer, Quant: 0.26 ug/mL-FEU (ref 0.00–0.48)

## 2010-06-12 MED ORDER — FLUCONAZOLE 150 MG PO TABS: 150.0000 mg | ORAL_TABLET | Freq: Once | ORAL | Status: AC

## 2010-06-12 NOTE — Progress Notes (Signed)
  Subjective:    Patient ID: Heidi [name], female    DOB: March 22, 1981, 30 y.o.   MRN: [id number]  HPI [phone]    Anxiety/Depression- pt seen at urgent care for severe anxiety/panic attack, she has had increased stress recently, now she is back and forth between a homeless shelter and staying at a motel with her GF. Her mother will not allow her back into the home, currently with GF therefore can not live with Husband and his mother. Wed she felt very overwhelmed and felt like she was going to have a stroke, she began to have severe left shoulder pain that radiated down her entire arm, she had numbness and tingling associated, then she felt like she couldn't walk and she was confused and panicked. She did not have her anxiety meds with her so she went to [facility]. States " I feel like I might just give up", no SI plan,  She has been going to Homeless shelter to wash clothes, take shower etc, also getting meals   She plans to get some hours at a resturant owned by someone she knows. Still working Part-time at [employer]- but has been warned about her work and possibly may be fired- at this time she feels her health is more important   Shoulder pain- continues to have left shoulder pain anterior shoulder feels like an ache when she moves it, no popping, occ tingling in hand, much improved from wed, ibuprofen helps with pain,did not get Meloxicam she received from [facility], raising arm causes some pain          BV- currently with Flagyl course for BV, neg GC/Chlamdyia antibiotics given by Urgent care at same visit, pt request diflucan secondary to recurrent yeast infections after antibiotic use    Review of Systems  - per above     Objective:   Physical Exam         GEN- very depressed appearing, NAD, lying back on table resting     MSK- Shoulder- normal ROM, mild TTP palpation at left bicipatal groove, neg speeds, neg yeargersons, rotator cuff in tact, neg empty can           Strength 5/5 bilateral  upper ext      Neuro- reflexes 2+ bilat, sensation in tact     Psych- depressed affect,but when she converses gets very worked up, anxious appearing- shaking her legs, no hallunications, speech normal, answers questions appropriately , speech is non tangential    Assessment & Plan:

## 2010-06-12 NOTE — Patient Instructions (Addendum)
If you are having a severe attack you can take 2 of the xanax tablets at once Follow-up next week for your anxiety Use the Diflucan after you have completed the flagyl Use Ibuprofen take up to 800mg  every 8 hours as needed  Call us if you feel like you may hurt yourself

## 2010-06-13 DIAGNOSIS — N760 Acute vaginitis: Secondary | ICD-10-CM | POA: Insufficient documentation

## 2010-06-13 DIAGNOSIS — M25512 Pain in left shoulder: Secondary | ICD-10-CM | POA: Insufficient documentation

## 2010-06-13 NOTE — Assessment & Plan Note (Signed)
As per previous her anxiety and depression are difficult to treat with her reluctance to take medications. I think either intensive outpatient therapy or inpatient therapy would be ideal for Mrs. Heidi [name], I am going to look into this for her. She is currently with her girlfriend Currrently she has no means for new prescriptions Will continue xanax she can increase to 2 tabs if needed for total of 1mg  No SI, contracted for safety She will visit with the homeless shelter today Next visit 1 week

## 2010-06-13 NOTE — Assessment & Plan Note (Signed)
No acute injury, I am concerned this is more somatic from her anxiety/panic attack that she had wed. No evidence of rotator cuff strain, biceps in tact, but pt does have mild pain near the biciptal groove. Prn ibuprofen

## 2010-06-13 NOTE — Assessment & Plan Note (Signed)
Currently treating BV, reviewed wet prep and other cultures Will give diflucan , pt to take after completing course of Flagyl

## 2010-06-13 NOTE — Assessment & Plan Note (Signed)
Pt has both severe anxiety and depression, see above regarding treatment plan.

## 2010-06-21 ENCOUNTER — Telehealth: Payer: Self-pay | Admitting: Family Medicine

## 2010-06-21 NOTE — Telephone Encounter (Signed)
Felt something crawling on back, brushed off a spider.  Did not get bitten.  Has been drinking and did not want to take her anxiety medicine.  Reassured her that if she wasnt bitten, nothing to worry about.

## 2010-06-23 LAB — WET PREP, GENITAL: Clue Cells Wet Prep HPF POC: NONE SEEN

## 2010-06-23 LAB — URINALYSIS, ROUTINE W REFLEX MICROSCOPIC
Bilirubin Urine: NEGATIVE
Glucose, UA: NEGATIVE mg/dL
Ketones, ur: NEGATIVE mg/dL
Protein, ur: NEGATIVE mg/dL
pH: 7 (ref 5.0–8.0)

## 2010-06-23 LAB — TSH: TSH: 0.469 u[IU]/mL (ref 0.350–4.500)

## 2010-06-23 LAB — GC/CHLAMYDIA PROBE AMP, GENITAL: Chlamydia, DNA Probe: NEGATIVE

## 2010-06-23 LAB — CBC
HCT: 39.4 % (ref 36.0–46.0)
Hemoglobin: 12.3 g/dL (ref 12.0–15.0)
RBC: 5.9 MIL/uL — ABNORMAL HIGH (ref 3.87–5.11)

## 2010-06-23 LAB — POCT PREGNANCY, URINE: Preg Test, Ur: NEGATIVE

## 2010-06-24 ENCOUNTER — Emergency Department (HOSPITAL_COMMUNITY)
Admission: EM | Admit: 2010-06-24 | Discharge: 2010-06-24 | Disposition: A | Discharge status: Home / Self Care | Payer: Medicaid Other | Attending: Emergency Medicine | Admitting: Emergency Medicine

## 2010-06-24 ENCOUNTER — Emergency Department (HOSPITAL_COMMUNITY): Payer: Medicaid Other

## 2010-06-24 ENCOUNTER — Telehealth: Payer: Self-pay | Admitting: Family Medicine

## 2010-06-24 DIAGNOSIS — N83209 Unspecified ovarian cyst, unspecified side: Secondary | ICD-10-CM | POA: Insufficient documentation

## 2010-06-24 DIAGNOSIS — N898 Other specified noninflammatory disorders of vagina: Secondary | ICD-10-CM | POA: Insufficient documentation

## 2010-06-24 DIAGNOSIS — K5900 Constipation, unspecified: Secondary | ICD-10-CM | POA: Insufficient documentation

## 2010-06-24 DIAGNOSIS — R109 Unspecified abdominal pain: Secondary | ICD-10-CM | POA: Insufficient documentation

## 2010-06-24 DIAGNOSIS — N949 Unspecified condition associated with female genital organs and menstrual cycle: Secondary | ICD-10-CM | POA: Insufficient documentation

## 2010-06-24 LAB — CBC
HCT: 36.6 % (ref 36.0–46.0)
Hemoglobin: 12 g/dL (ref 12.0–15.0)
RBC: 5.8 MIL/uL — ABNORMAL HIGH (ref 3.87–5.11)
RDW: 15.2 % (ref 11.5–15.5)
WBC: 7.1 10*3/uL (ref 4.0–10.5)

## 2010-06-24 LAB — DIFFERENTIAL
Basophils Absolute: 0 10*3/uL (ref 0.0–0.1)
Eosinophils Relative: 1 % (ref 0–5)
Lymphocytes Relative: 46 % (ref 12–46)
Neutro Abs: 3.2 10*3/uL (ref 1.7–7.7)
Neutrophils Relative %: 46 % (ref 43–77)

## 2010-06-24 LAB — URINE MICROSCOPIC-ADD ON

## 2010-06-24 LAB — BASIC METABOLIC PANEL
Chloride: 107 mEq/L (ref 96–112)
GFR calc non Af Amer: >60 mL/min (ref >60)
Glucose, Bld: 86 mg/dL (ref 70–99)
Potassium: 3.9 mEq/L (ref 3.5–5.1)
Sodium: 139 mEq/L (ref 135–145)

## 2010-06-24 LAB — URINALYSIS, ROUTINE W REFLEX MICROSCOPIC
Bilirubin Urine: NEGATIVE
Nitrite: NEGATIVE
Specific Gravity, Urine: 1.017 (ref 1.005–1.030)
pH: 6 (ref 5.0–8.0)

## 2010-06-24 LAB — WET PREP, GENITAL

## 2010-06-24 LAB — GC/CHLAMYDIA PROBE AMP, GENITAL
Chlamydia, DNA Probe: NEGATIVE
GC Probe Amp, Genital: NEGATIVE

## 2010-06-24 LAB — POCT PREGNANCY, URINE: Preg Test, Ur: NEGATIVE

## 2010-06-24 NOTE — Telephone Encounter (Signed)
Pt seen in the ER today, ultrasound showed a ruptured cyst/ hemmorhagic cyst, wet prep unremarkable, GC/Chlamydia pending. Reviewed EDP note Given treatment for PID, though pt in same sex relationship but has had intercourse with her husband- Given Rocephin and Doxy, percocet for pain  I discussed ER findings with pt, she was very concerned about the cyst, will need a repeat Ultrasound in 2 months to follow-up the cyst for resolution. She felt better after our conversation

## 2010-06-24 NOTE — Telephone Encounter (Signed)
Pt calling to speak with you because she is very anxious and just left hospital due to a ruptured cyst and pid.  She is quite upset and wanted to speak with you regarding this and the meds given.

## 2010-07-07 ENCOUNTER — Encounter: Payer: Self-pay | Admitting: Family Medicine

## 2010-07-07 ENCOUNTER — Ambulatory Visit (INDEPENDENT_AMBULATORY_CARE_PROVIDER_SITE_OTHER): Payer: Medicaid Other | Admitting: Family Medicine

## 2010-07-07 VITALS — BP 100/67 | HR 80 | Temp 98.3°F | Ht 63.0 in | Wt 191.0 lb

## 2010-07-07 DIAGNOSIS — F411 Generalized anxiety disorder: Secondary | ICD-10-CM

## 2010-07-07 DIAGNOSIS — G8929 Other chronic pain: Secondary | ICD-10-CM | POA: Insufficient documentation

## 2010-07-07 DIAGNOSIS — N83209 Unspecified ovarian cyst, unspecified side: Secondary | ICD-10-CM

## 2010-07-07 DIAGNOSIS — IMO0001 Reserved for inherently not codable concepts without codable children: Secondary | ICD-10-CM

## 2010-07-07 DIAGNOSIS — R51 Headache: Secondary | ICD-10-CM

## 2010-07-07 DIAGNOSIS — M7918 Myalgia, other site: Secondary | ICD-10-CM

## 2010-07-07 DIAGNOSIS — R519 Headache, unspecified: Secondary | ICD-10-CM | POA: Insufficient documentation

## 2010-07-07 LAB — CK: Total CK: 102 U/L (ref 7–177)

## 2010-07-07 MED ORDER — PROPRANOLOL HCL 10 MG PO TABS: 20.0000 mg | ORAL_TABLET | Freq: Two times a day (BID) | ORAL | Status: DC

## 2010-07-07 NOTE — Patient Instructions (Addendum)
Call the eye doctor and reschedule Start the propanolol 1 tablet daily for your headaches, avoid taking the excedrin every day  I will check your muscle enzymes  Continue your xanax Call behavioral health  1 month

## 2010-07-08 ENCOUNTER — Encounter: Payer: Self-pay | Admitting: Family Medicine

## 2010-07-08 ENCOUNTER — Telehealth: Payer: Self-pay | Admitting: *Deleted

## 2010-07-08 ENCOUNTER — Inpatient Hospital Stay (HOSPITAL_COMMUNITY): Admission: RE | Admit: 2010-07-08 | Payer: Medicaid Other | Source: Ambulatory Visit

## 2010-07-08 DIAGNOSIS — N83209 Unspecified ovarian cyst, unspecified side: Secondary | ICD-10-CM | POA: Insufficient documentation

## 2010-07-08 NOTE — Telephone Encounter (Signed)
Message copied by Tessie Fass on Wed Jul 08, 2010  2:47 PM ------      Message from: Milinda Antis      Created: Wed Jul 08, 2010  8:53 AM       Please let Ms. Marlyne Beards know her test for her muscle aches were normal, no sign of infection or inflammation. It is okay for her to exercise and walk

## 2010-07-08 NOTE — Assessment & Plan Note (Signed)
Pt with daily HA, which could be tension related vs true migraine, also has component of rebound, taking daily OTC meds. Low dose propanolol, to get her off OTC meds Will change to 10mg  BID

## 2010-07-08 NOTE — Assessment & Plan Note (Signed)
Hemorrhagic cyst noted, repeat U/S in 3months

## 2010-07-08 NOTE — Assessment & Plan Note (Signed)
No change to pt situation as detailed in notes before No change to xanax Pt given information for [name]- as they have intensive outpatient therapy which she agrees she needs, states she will make the call. Will continue to follow with her

## 2010-07-08 NOTE — Assessment & Plan Note (Signed)
Normal exam, regarding neurological and MSK, it can be difficult to distinguish somatic complaints in Ms. Heidi [name]. Check CK,ESR, reviewed labs from 4/4- lytes normal, no sign of infection Reassurance and monitor for any other changes

## 2010-07-08 NOTE — Telephone Encounter (Signed)
Attempted to call patient, unavailable. If patient returns call please read message below.[name], Heidi [name]

## 2010-07-08 NOTE — Progress Notes (Signed)
  Subjective:    Patient ID: Heidi [name], female    DOB: [birth year], 30 y.o.   MRN: [id number]  HPI  Anxiety- feels things have not changed, takes xanax almost daily, has been seeing her husband more, GFwill be sent off to rehab for violation of parole, she thinks this is a good thing for her. No SI, states she has not been crying as much as usual. Lost her job at [employer] but from last visit- knew she was on the brink   HA- having more HA, at least 1 a day, they last for a few hours typically until she takes OTC generic excedrin which is daily. Occ nausea, occ photophobia. HA varies from bilat temporals to neck to occipital region, also feels her vision is bad and needs to see an eye doctor to have prescription changed, other times feels it is her sinus  Leg pain- leg aching in left leg x 1-2 months, occ feels weak, no knee pain, pain located in calf and thigh region, better with massage, occ numbness in feet, no radiation from back, worse when walking  Ovaran cyst- noted from Ultrasound at [facility]- small benign hemorrhagic cyst- given reassurance needs f/u 3 months   Review of Systems per above     Objective:   Physical Exam  GEN- NAD  HEENT-PERRL, EOMI, Nares clear, oropharynx clear, neck supple, normal ROM, fundoscopic exam normal  EXT- neg SLR bilat, Left knee- normal ROM, no effusion, lower ext strength equal bilat 5/5,legs non tender to palpation  Neuro- normal gait, no muscle wasting, DTR 2+ patellar, achilles, sensation grossly in tact  PSYCH- no SI, anxious appearing, no crying during visit, judgement fair       Assessment & Plan:

## 2010-07-08 NOTE — Telephone Encounter (Signed)
Also patient is to change propanolol to 1 tab by mouth twice a day for headache.Deerica Waszak, Rodena Medin

## 2010-07-09 ENCOUNTER — Telehealth: Payer: Self-pay | Admitting: *Deleted

## 2010-07-09 NOTE — Telephone Encounter (Signed)
Attempted to call patient again, message unavailable.Heidi [name]  

## 2010-07-09 NOTE — Telephone Encounter (Signed)
Attempted to call patient again, message unavailable.[name], Heidi Medin

## 2010-07-13 NOTE — Telephone Encounter (Signed)
Spoke with patient and gave message from Dr Loistine Chance, Rodena Medin

## 2010-07-28 ENCOUNTER — Emergency Department (HOSPITAL_COMMUNITY)
Admission: EM | Admit: 2010-07-28 | Discharge: 2010-07-28 | Disposition: A | Discharge status: Home / Self Care | Payer: Medicaid Other | Attending: Emergency Medicine | Admitting: Emergency Medicine

## 2010-07-28 DIAGNOSIS — R509 Fever, unspecified: Secondary | ICD-10-CM | POA: Insufficient documentation

## 2010-07-28 DIAGNOSIS — L02219 Cutaneous abscess of trunk, unspecified: Secondary | ICD-10-CM | POA: Insufficient documentation

## 2010-08-03 ENCOUNTER — Other Ambulatory Visit: Payer: Self-pay | Admitting: Family Medicine

## 2010-08-03 DIAGNOSIS — F411 Generalized anxiety disorder: Secondary | ICD-10-CM

## 2010-08-03 MED ORDER — ALPRAZOLAM 0.5 MG PO TABS: 1.0000 mg | ORAL_TABLET | Freq: Two times a day (BID) | ORAL | Status: DC | PRN

## 2010-08-04 ENCOUNTER — Ambulatory Visit (INDEPENDENT_AMBULATORY_CARE_PROVIDER_SITE_OTHER): Payer: Medicaid Other | Admitting: Family Medicine

## 2010-08-04 ENCOUNTER — Encounter: Payer: Self-pay | Admitting: Family Medicine

## 2010-08-04 ENCOUNTER — Ambulatory Visit: Payer: Medicaid Other | Admitting: Family Medicine

## 2010-08-04 DIAGNOSIS — J019 Acute sinusitis, unspecified: Secondary | ICD-10-CM

## 2010-08-04 MED ORDER — GUAIFENESIN ER 600 MG PO TB12: 600.0000 mg | ORAL_TABLET | Freq: Two times a day (BID) | ORAL | Status: DC

## 2010-08-04 MED ORDER — MOMETASONE FUROATE 50 MCG/ACT NA SUSP: 2.0000 | Freq: Every day | NASAL | Status: AC

## 2010-08-04 NOTE — Progress Notes (Signed)
  Subjective:    Patient ID: Heidi [name], female    DOB: November 23, 1980, 30 y.o.   MRN: [id number]  HPI This is a 30 year old female presents to clinic w/ CC: sinusitis.  Symptoms: fever, cough, headache, nasal congestion.  Started 3 days ago.  Patient has been taking Ibuprofen 800 mg for facial pain.  She is also taking Bactrim for a skin lesion on her breast per ED.  Patient says she has a hx of anxiety and is upset because symptoms are keeping her from going on job interviews.  Denies chest pain, abdominal pain, N/V, constipation/diarrhea, decreased appetite.  Patient is hesitant to take additional meds that may interact with Bactrim.  Review of Systems Per HPI    Objective:   Physical Exam  Constitutional: She appears well-developed and well-nourished. No distress.  HENT:  Nose: Rhinorrhea present. Right sinus exhibits frontal sinus tenderness. Left sinus exhibits frontal sinus tenderness.  Mouth/Throat: Oropharynx is clear and moist.  Eyes: Conjunctivae are normal.  Cardiovascular: Normal rate and regular rhythm.  Exam reveals friction rub. Exam reveals no gallop.   No murmur heard. Pulmonary/Chest: Effort normal and breath sounds normal. No respiratory distress. She has no wheezes. She has no rales.       Transmitted upper airway sounds  Abdominal: Soft. She exhibits no distension. There is no tenderness.  Neurological: She is alert.  Psychiatric:       anxious          Assessment & Plan:

## 2010-08-04 NOTE — Assessment & Plan Note (Deleted)
Patient has had symptoms of sinusitis for 3 days.  Likely viral in etiology.  Will treat with conservative management for now.  Will give both Nasonex and Mucinex prn for congestion.  Advised patient that if symptoms persist for  >10 days, to please call MD.  I may consider treating with Amoxicillin at that time.  Patient should finish course of Bactrim for breast lesion (given by ED).  Red flags reviewed.  Patient understood and agreed with plan.   

## 2010-08-04 NOTE — Assessment & Plan Note (Signed)
Patient has had symptoms of sinusitis for 3 days.  Likely viral in etiology.  Will treat with conservative management for now.  Will give both Nasonex and Mucinex prn for congestion.  Advised patient that if symptoms persist for  >10 days, to please call MD.  I may consider treating with Amoxicillin at that time.  Patient should finish course of Bactrim for breast lesion (given by ED).  Red flags reviewed.  Patient understood and agreed with plan.

## 2010-08-04 NOTE — Patient Instructions (Signed)
It was nice to meet you. It appears you have acute viral sinusitis. Please purchase OTC Tylenol and take as needed for pain. Please pick up Nasonex and Mucinex from your pharmacy and use as directed. If your symptoms do not improve in 5-7 days, please call MD. We may consider starting antibiotic if necessary. Continue to rest and drink plenty of fluids. Call the office if your symptoms worsen. Thanks, Dr. Sherron Flemings Sondra Come

## 2010-08-07 NOTE — Discharge Summary (Signed)
NAME:  Heidi [name], Heidi [name]                       ACCOUNT NO.:  [id number]   MEDICAL RECORD NO.:  [id number]                   PATIENT TYPE:  INP   LOCATION:  9326                                 FACILITY:  [facility]   PHYSICIAN:  Tanya S. Shawnie Pons, M.D.                DATE OF BIRTH:  1980-06-30   DATE OF ADMISSION:  01/31/2002  DATE OF DISCHARGE:  02/01/2002                                 DISCHARGE SUMMARY   DISCHARGE DIAGNOSES:  1. Intrauterine pregnancy at [redacted] weeks gestation.  2. Hemorrhagic cystitis.  3. Bacterial vaginosis.   DISCHARGE MEDICATIONS:  1. Ceftin 500 mg p.o. b.i.d. x14 days.  2. Flagyl 500 mg p.o. b.i.d. x7 days.  3. Prenatal vitamins p.o. q.d.   HISTORY OF PRESENT ILLNESS:  Please see full H&P for details.  In brief, the  patient is a 30 year old African-American female gravida 1, para 0 at 81  weeks who presented to [facility] with one day history of pain upon  urination.  The patient was seen on the day of admission by her primary  physician at the [facility] and was started on treatment with  Macrobid for urinary tract infection.  Later that day patient developed  gross hematuria and then presented to [facility].  She also complained  of lower abdominal pain.  She denied any back pain or burning with  urination.  She denied any fever, chills, nausea, or vomiting.   LABORATORY DATA:  On admission included urinalysis showing large blood and  trace leukocytes, 11-20 wbc's, 3-6 rbc's, and few bacteria.  CBC revealed a  WBC of 10.3, hemoglobin 10.9, hematocrit 33.4, platelet count 252,000.  Normal electrolytes with a BUN of 12, creatinine 0.8, chloride 110.  Wet  prep revealed too numerous to count bacteria and too numerous to count clue  cells.  No yeast present.  No trichomonas.  The patient was admitted for  treatment of suspected pyelonephritis.   HOSPITAL COURSE:  The patient was started on treatment with IV Rocephin 2 g  daily.  She  remained afebrile during hospitalization.  Fetal heart tones  were dopplered daily.  By day of discharge patient was ambulating without  difficulty.  She was tolerating p.o.'s well.  She did continue to have  intermittent lower abdominal cramping.  She denied any further episodes of  gross hematuria.  She did not have any CVA tenderness or other complaints.  Hospitalization day two complete.  Her urine culture grew greater than  100,000 colonies of Proteus mirabilis.  Sensitivities were not available at  discharge.   DISCHARGE INSTRUCTIONS:  The patient was discharged to home in stable  condition with preterm labor precautions.  She was instructed to rest and  abstain from sexual activity for at least two weeks.  She will follow up as  scheduled with Dr. Mardelle Matte at the [facility] in four weeks.     Heidi  Woodfin [name], M.D.                      Shelbie Proctor. Shawnie Pons, M.D.    Nadara Eaton  D:  02/01/2002  T:  02/01/2002  Job:  [id number]

## 2010-08-07 NOTE — Op Note (Signed)
[facility] [place]  Patient:    Heidi [name], Heidi [name]                    MRN: [id number] Proc. Date: 05/05/99 Adm. Date:  [id number] Attending:  Tammi Sou                           Operative Report  DATE OF BIRTH:  06/19/1980  PREOPERATIVE DIAGNOSIS:  Pilonidal abscess.  POSTOPERATIVE DIAGNOSIS:  Pilonidal abscess.  PROCEDURE:  Incision and drainage of pilonidal abscess.  ANESTHESIA:  10 cc of 1% Xylocaine plain.  INDICATIONS:  Ms. Guastella is an 30 year old black female who has a cyst of her  pilonidal abscess which has actually been drained twice before in the emergency  room, though it sounds like she has never had a definitive surgical drainage of  this cyst.  She presents to the _______ ________  with a two-day history of worsening pain, fever, with a white blood count of 15,300.  While in the emergency room I did an incision and drainage of this abscess.  DESCRIPTION OF PROCEDURE:  The patient was placed in a prone position.  Her buttocks were painted with Betadine solution and infiltrated with 1% Xylocaine plain, using approximately 10 cc.  I did an incision and drainage and obtained about 20 cc of purulent yellow pus from this wound.  I packed it with plain gauze. I then dressed the wound.  She will plan to remove the packing 12 hours later, and start her on Augmentin 00 mg b.i.d. for one week, Vicodin for pain.  She will see me back in the office in two to three weeks. DD:  05/05/99 TD:  05/05/99 Job: [id number]

## 2010-08-12 ENCOUNTER — Telehealth: Payer: Self-pay | Admitting: Family Medicine

## 2010-08-12 NOTE — Telephone Encounter (Signed)
Pt calling asking for abx for her sinus infection to be sent to [facility], also asking for referral to the foot center for her heel pain.

## 2010-08-12 NOTE — Telephone Encounter (Signed)
Left message for patient to return call. She was seen by Tye Savoy on the 15 and it was mentioned in the notes that she may start abx if symptoms have not improved within 10 days, will need to know which pharmacy she uses. If she is having pain in her foot she will need to schedule appointment with PCP.Merrel Crabbe, Rodena Medin

## 2010-08-13 ENCOUNTER — Ambulatory Visit: Payer: Medicaid Other | Admitting: Family Medicine

## 2010-08-13 ENCOUNTER — Telehealth: Payer: Self-pay | Admitting: Family Medicine

## 2010-08-13 ENCOUNTER — Encounter: Payer: Self-pay | Admitting: Family Medicine

## 2010-08-13 MED ORDER — AMOXICILLIN-POT CLAVULANATE 875-125 MG PO TABS: 1.0000 | ORAL_TABLET | Freq: Two times a day (BID) | ORAL | Status: AC

## 2010-08-13 NOTE — Telephone Encounter (Signed)
Pt called back, see below message, send to [facility]

## 2010-08-13 NOTE — Telephone Encounter (Signed)
Pt called in still having trouble with her sinus infection, since it has been 10 days will send Augmentin in. She will need to make an appt with me as she did not show today for her foot problem. Please let her know

## 2010-08-13 NOTE — Telephone Encounter (Signed)
Will fwd. To Dr.Dela Sondra Come to send Rx .Arlyss Repress

## 2010-08-14 NOTE — Telephone Encounter (Signed)
Called patient and informed of Rx sent to pharmacy.Heidi [name]   

## 2010-08-20 NOTE — Telephone Encounter (Signed)
Med refilled, closing encounter.

## 2010-08-27 ENCOUNTER — Ambulatory Visit (INDEPENDENT_AMBULATORY_CARE_PROVIDER_SITE_OTHER): Payer: Medicaid Other | Admitting: Family Medicine

## 2010-08-27 ENCOUNTER — Encounter: Payer: Self-pay | Admitting: Family Medicine

## 2010-08-27 VITALS — BP 112/74 | HR 72 | Wt 188.7 lb

## 2010-08-27 DIAGNOSIS — M79673 Pain in unspecified foot: Secondary | ICD-10-CM

## 2010-08-27 DIAGNOSIS — M79609 Pain in unspecified limb: Secondary | ICD-10-CM

## 2010-08-27 DIAGNOSIS — F411 Generalized anxiety disorder: Secondary | ICD-10-CM

## 2010-08-27 NOTE — Assessment & Plan Note (Signed)
Pt seems to be moving in the right step with a new job and now obtaining a place to live. She brought her meds and has been using rarely Hopefully she will hang onto this stride

## 2010-08-27 NOTE — Assessment & Plan Note (Signed)
Possible heel spur, does not fit plantar fasciitis, supportive inserts, given script for heel cups to try, see instructions If no improvement would obtain plain film based on duration

## 2010-08-27 NOTE — Progress Notes (Signed)
  Subjective:    Patient ID: Heidi [name], female    DOB: 02/05/1981, 30 y.o.   MRN: [id number]  HPI  Right heel pain x 3-4 months, no specific injury, previously worked at [employer] now has job as Conservation officer, nature at [employer], does not wear very supportive shoes, no swelling in foot after work but states she has some redness, massage helps pain   Pt relationship with husband steady, he also has a GF, her GF is going to jail for 75 days, she has a new job, will get a new apartment soon, has not needed very much anxiety med, feels like she is moving toward a good spot, has taken up her faith again which helps her day to day    Review of Systems     Objective:   Physical Exam   GEN: NAD , alert and oriented   Foot- TTP over right heel, non tender over insertion of plantar fascia, no edema, no redness, no deformity compared to right, normal ankle ROM    Pulse 2+, neg squeeze test  gait non antalgic  Psych- smiling, well groomed, good eye contact, no apparent SI       Assessment & Plan:

## 2010-08-27 NOTE — Patient Instructions (Addendum)
Use Ice on your heel Try shoe inserts, make sure you where comfortable shoes You can pick up the heel cups at a supply store If this does not improve with these measures then come back to be seen- an x-ray will be done at that time. [address] [place], [place] [address] [phone] . [phone]

## 2010-09-11 ENCOUNTER — Emergency Department (HOSPITAL_COMMUNITY)
Admission: EM | Admit: 2010-09-11 | Discharge: 2010-09-12 | Disposition: A | Discharge status: Home / Self Care | Payer: Medicaid Other | Attending: Emergency Medicine | Admitting: Emergency Medicine

## 2010-09-11 ENCOUNTER — Telehealth: Payer: Self-pay | Admitting: Family Medicine

## 2010-09-11 DIAGNOSIS — F411 Generalized anxiety disorder: Secondary | ICD-10-CM | POA: Insufficient documentation

## 2010-09-11 DIAGNOSIS — H5710 Ocular pain, unspecified eye: Secondary | ICD-10-CM | POA: Insufficient documentation

## 2010-09-11 DIAGNOSIS — M79609 Pain in unspecified limb: Secondary | ICD-10-CM | POA: Insufficient documentation

## 2010-09-11 DIAGNOSIS — J3489 Other specified disorders of nose and nasal sinuses: Secondary | ICD-10-CM | POA: Insufficient documentation

## 2010-09-11 DIAGNOSIS — H539 Unspecified visual disturbance: Secondary | ICD-10-CM | POA: Insufficient documentation

## 2010-09-11 DIAGNOSIS — R51 Headache: Secondary | ICD-10-CM | POA: Insufficient documentation

## 2010-09-11 NOTE — Telephone Encounter (Signed)
Been having pain in her temple and isn't sure if it is from her glasses and would like to talk to  Dr. Jeanice Lim.

## 2010-09-13 NOTE — Telephone Encounter (Signed)
I saw pt was seen in the ED for a sinus headache. It is okay for her to take the meds they prescribed. If she is not better she can come in for a work-in appt within the next week.

## 2010-09-14 ENCOUNTER — Telehealth: Payer: Self-pay | Admitting: *Deleted

## 2010-09-14 NOTE — Telephone Encounter (Signed)
Pt needs OV. See previous message. Heidi [name], [name]

## 2010-09-22 ENCOUNTER — Encounter: Payer: Self-pay | Admitting: Family Medicine

## 2010-09-24 ENCOUNTER — Encounter: Payer: Self-pay | Admitting: Family Medicine

## 2010-09-24 ENCOUNTER — Ambulatory Visit (INDEPENDENT_AMBULATORY_CARE_PROVIDER_SITE_OTHER): Payer: Medicaid Other | Admitting: Family Medicine

## 2010-09-24 VITALS — BP 134/78 | HR 80 | Temp 98.4°F | Ht 63.0 in | Wt 189.0 lb

## 2010-09-24 DIAGNOSIS — N760 Acute vaginitis: Secondary | ICD-10-CM

## 2010-09-24 LAB — POCT WET PREP (WET MOUNT)

## 2010-09-25 LAB — GC/CHLAMYDIA PROBE AMP, GENITAL: Chlamydia, DNA Probe: NEGATIVE

## 2010-09-26 ENCOUNTER — Telehealth: Payer: Self-pay | Admitting: Family Medicine

## 2010-09-26 MED ORDER — METRONIDAZOLE 500 MG PO TABS: 500.0000 mg | ORAL_TABLET | Freq: Two times a day (BID) | ORAL | Status: AC

## 2010-09-26 NOTE — Telephone Encounter (Signed)
Called patient and left message that her condition was what she had though it was and that I was sending in a prescription for flagyl.  If patient calls back with questions, she has BV and is being given flagyl twice daily for 7 days.

## 2010-09-30 ENCOUNTER — Emergency Department (HOSPITAL_COMMUNITY)
Admission: EM | Admit: 2010-09-30 | Discharge: 2010-10-01 | Disposition: A | Discharge status: Home / Self Care | Payer: Medicaid Other | Attending: Emergency Medicine | Admitting: Emergency Medicine

## 2010-09-30 DIAGNOSIS — R51 Headache: Secondary | ICD-10-CM | POA: Insufficient documentation

## 2010-09-30 DIAGNOSIS — N760 Acute vaginitis: Secondary | ICD-10-CM | POA: Insufficient documentation

## 2010-09-30 DIAGNOSIS — F411 Generalized anxiety disorder: Secondary | ICD-10-CM | POA: Insufficient documentation

## 2010-10-02 ENCOUNTER — Emergency Department (HOSPITAL_COMMUNITY)
Admission: EM | Admit: 2010-10-02 | Discharge: 2010-10-02 | Disposition: A | Discharge status: Home / Self Care | Payer: Medicaid Other | Attending: Emergency Medicine | Admitting: Emergency Medicine

## 2010-10-02 DIAGNOSIS — F411 Generalized anxiety disorder: Secondary | ICD-10-CM | POA: Insufficient documentation

## 2010-10-04 NOTE — Progress Notes (Signed)
Subjective: the patient presents complaining of three days of vaginal discharge. She reports that approximately 4 days ago she had intercourse with her husband for the first time in many months. Since that time she has noticed an increase in her discharge. She reports a significant history of bacterial vaginosis and feels that this likely represents another episode. She has no pain, itching, abdominal pain, bleeding, fevers or chills, or any other significant complaints.  Objective: vitals reviewed and are unremarkable. Pelvic: normal external female genitalia, scant, foul-smelling discharge is present in the vagina. There is no cervical friability, motion tenderness, or bleeding. Adnexa are nontender. Uterus is abnormal size. No abdominal tenderness on exam.  Assessment and plan: the patient is a 30 year old female presenting with signs and symptoms of bacterial vaginosis with no other concerning features. Will provide the patient with a prescription for Flagyl and instructed her to return if symptoms worsen or continue. Will contact the patient if her GC or chlamydia screenings turn up positive.

## 2010-10-12 ENCOUNTER — Ambulatory Visit (INDEPENDENT_AMBULATORY_CARE_PROVIDER_SITE_OTHER): Payer: Medicaid Other | Admitting: Family Medicine

## 2010-10-12 ENCOUNTER — Encounter: Payer: Self-pay | Admitting: Family Medicine

## 2010-10-12 DIAGNOSIS — N760 Acute vaginitis: Secondary | ICD-10-CM | POA: Insufficient documentation

## 2010-10-12 DIAGNOSIS — Z309 Encounter for contraceptive management, unspecified: Secondary | ICD-10-CM | POA: Insufficient documentation

## 2010-10-12 LAB — POCT WET PREP (WET MOUNT)
Clue Cells Wet Prep HPF POC: NEGATIVE
Trichomonas Wet Prep HPF POC: NEGATIVE

## 2010-10-12 LAB — POCT URINE PREGNANCY: Preg Test, Ur: NEGATIVE

## 2010-10-12 MED ORDER — FLUCONAZOLE 150 MG PO TABS: 150.0000 mg | ORAL_TABLET | Freq: Once | ORAL | Status: DC

## 2010-10-12 MED ORDER — NORGESTIMATE-ETH ESTRADIOL 0.25-35 MG-MCG PO TABS: 1.0000 | ORAL_TABLET | Freq: Every day | ORAL | Status: DC

## 2010-10-12 MED ORDER — LEVONORGESTREL 0.75 MG PO TABS: 0.7500 mg | ORAL_TABLET | Freq: Two times a day (BID) | ORAL | Status: DC

## 2010-10-12 NOTE — Assessment & Plan Note (Signed)
Discussed plan B, will start.  Patient chooses OCP after long discussion on options.  I encouraged long term BC for best efficacy.  Follow-up prn.

## 2010-10-12 NOTE — Assessment & Plan Note (Signed)
bv is cleared.  Today with yeast.  Rx Diflucan.

## 2010-10-12 NOTE — Telephone Encounter (Signed)
Error

## 2010-10-12 NOTE — Progress Notes (Signed)
  Subjective:    Patient ID: Heidi [name], female    DOB: 05/15/1980, 30 y.o.   MRN: [id number]  HPI  Here to discuss contraception  Is in a homosexual relationship did not need birth control, unexpectedly started having sex with her husband and was not taking birth control. Has been 2.5 days since first intercourse.  LMP July 15th  Feels safe in both relationships, no coerced, no domestic violence.  Does not desire pregnancy.  She did not wear condoms, reports vaginitis consistent with yeast after recent tx with flagyl.  No fever, chills, pelvic pain.  Review of Systems See hpi    Objective:   Physical Exam GEN: Alert & Oriented, No acute distress GU:  Thin white vaginal discharge,  No CMT.  NO exterior lesions.      Assessment & Plan:

## 2010-10-12 NOTE — Patient Instructions (Signed)
Sprintec is generic for ortho cyclen.  It is a combo birth control pill.  I know it is available at [facility] for $9 a month, so if it costs you much more [facility], call them and get this prescription transferred.  Use condoms always.  Consider long term birth control for greatest control against pregnancy!  You can get Plan B behind the counter at [facility] without a prescription. It works best when taken in the first day after sex.

## 2010-10-12 NOTE — Telephone Encounter (Signed)
This encounter was created in error - please disregard.

## 2010-10-13 ENCOUNTER — Encounter: Payer: Self-pay | Admitting: Family Medicine

## 2010-10-19 ENCOUNTER — Telehealth: Payer: Self-pay | Admitting: Family Medicine

## 2010-10-19 NOTE — Telephone Encounter (Signed)
Has anxiety and is having more anxiety today. Last week took a Plan B and yesterday took a birth control pill. 1.5 weeks ago had a period and today had bleeding again.   Plan to continue [facility] pill until the patient finishes the pack.

## 2010-10-28 ENCOUNTER — Emergency Department (HOSPITAL_COMMUNITY)
Admission: EM | Admit: 2010-10-28 | Discharge: 2010-10-29 | Disposition: A | Discharge status: Home / Self Care | Payer: Medicaid Other | Attending: Emergency Medicine | Admitting: Emergency Medicine

## 2010-10-28 DIAGNOSIS — M545 Low back pain, unspecified: Secondary | ICD-10-CM | POA: Insufficient documentation

## 2010-10-28 DIAGNOSIS — R1031 Right lower quadrant pain: Secondary | ICD-10-CM | POA: Insufficient documentation

## 2010-10-29 LAB — COMPREHENSIVE METABOLIC PANEL
Alkaline Phosphatase: 45 U/L (ref 39–117)
BUN: 11 mg/dL (ref 6–23)
CO2: 25 mEq/L (ref 19–32)
Calcium: 9.1 mg/dL (ref 8.4–10.5)
GFR calc Af Amer: >60 mL/min (ref >60)
GFR calc non Af Amer: >60 mL/min (ref >60)
Glucose, Bld: 87 mg/dL (ref 70–99)
Potassium: 3.8 mEq/L (ref 3.5–5.1)
Total Protein: 7.3 g/dL (ref 6.0–8.3)

## 2010-10-29 LAB — CBC
HCT: 35.5 % — ABNORMAL LOW (ref 36.0–46.0)
MCHC: 33.2 g/dL (ref 30.0–36.0)
MCV: 63.1 fL — ABNORMAL LOW (ref 78.0–100.0)
RDW: 15.7 % — ABNORMAL HIGH (ref 11.5–15.5)

## 2010-10-29 LAB — URINALYSIS, ROUTINE W REFLEX MICROSCOPIC
Bilirubin Urine: NEGATIVE
Glucose, UA: NEGATIVE mg/dL
Hgb urine dipstick: NEGATIVE
Protein, ur: NEGATIVE mg/dL
Urobilinogen, UA: 0.2 mg/dL (ref 0.0–1.0)

## 2010-10-29 LAB — DIFFERENTIAL
Basophils Relative: 0 % (ref 0–1)
Eosinophils Relative: 1 % (ref 0–5)
Lymphs Abs: 4.6 10*3/uL — ABNORMAL HIGH (ref 0.7–4.0)
Monocytes Absolute: 0.4 10*3/uL (ref 0.1–1.0)
Neutro Abs: 3.1 10*3/uL (ref 1.7–7.7)

## 2010-10-29 LAB — WET PREP, GENITAL: Clue Cells Wet Prep HPF POC: NONE SEEN

## 2010-10-29 LAB — POCT PREGNANCY, URINE: Preg Test, Ur: NEGATIVE

## 2010-10-29 LAB — LIPASE, BLOOD: Lipase: 22 U/L (ref 11–59)

## 2010-10-30 LAB — GC/CHLAMYDIA PROBE AMP, GENITAL: Chlamydia, DNA Probe: NEGATIVE

## 2010-11-01 ENCOUNTER — Telehealth: Payer: Self-pay | Admitting: Family Medicine

## 2010-11-01 NOTE — Telephone Encounter (Signed)
Pt has heel spurs and states that she has been having left sided leg pain with a lot of walking recently and that makes her hyperventilate and become very anxious, she wanted to know if she needed to come to the hospital.  Pt denies swelling, numbness, loss of strength, only cramping in Left calf from time to time.  Pt still abel to walk but is very nervous to have any pain. Discussed with pt that it could be due to the footwear she wears which are heels.  Told her to try tennis shoes tomorrow, also to call for an appointment to have pt seen and see if referral to sports medicine is appropriate. Pt agreed.

## 2010-11-03 ENCOUNTER — Inpatient Hospital Stay (INDEPENDENT_AMBULATORY_CARE_PROVIDER_SITE_OTHER)
Admission: RE | Admit: 2010-11-03 | Discharge: 2010-11-03 | Disposition: A | Discharge status: Home / Self Care | Payer: Medicaid Other | Source: Ambulatory Visit | Attending: Family Medicine | Admitting: Family Medicine

## 2010-11-03 DIAGNOSIS — M779 Enthesopathy, unspecified: Secondary | ICD-10-CM

## 2010-11-15 ENCOUNTER — Telehealth: Payer: Self-pay | Admitting: Family Medicine

## 2010-11-15 NOTE — Telephone Encounter (Signed)
Received call from patient that she is having constipation and some mild pain below her belly button.  Denies nausea, vomiting, fevers, blood in her stool.  Has cut back on her vitamins (which were high in calcium) and constipation and pain improved some.  Advised on red flags to look for including intense pain, fever, vomiting, blood in her stool.  Agreed with decrease in vitamins.

## 2010-11-16 ENCOUNTER — Encounter: Payer: Self-pay | Admitting: Family Medicine

## 2010-11-16 ENCOUNTER — Ambulatory Visit (INDEPENDENT_AMBULATORY_CARE_PROVIDER_SITE_OTHER): Payer: Medicaid Other | Admitting: Family Medicine

## 2010-11-16 ENCOUNTER — Other Ambulatory Visit (HOSPITAL_COMMUNITY)
Admission: RE | Admit: 2010-11-16 | Discharge: 2010-11-16 | Disposition: A | Discharge status: Home / Self Care | Payer: Medicaid Other | Source: Ambulatory Visit | Attending: Family Medicine | Admitting: Family Medicine

## 2010-11-16 VITALS — BP 122/76 | HR 93 | Temp 98.3°F | Ht 64.0 in | Wt 188.0 lb

## 2010-11-16 DIAGNOSIS — M79609 Pain in unspecified limb: Secondary | ICD-10-CM

## 2010-11-16 DIAGNOSIS — Z124 Encounter for screening for malignant neoplasm of cervix: Secondary | ICD-10-CM

## 2010-11-16 DIAGNOSIS — Z01419 Encounter for gynecological examination (general) (routine) without abnormal findings: Secondary | ICD-10-CM | POA: Insufficient documentation

## 2010-11-16 DIAGNOSIS — R300 Dysuria: Secondary | ICD-10-CM

## 2010-11-16 DIAGNOSIS — F329 Major depressive disorder, single episode, unspecified: Secondary | ICD-10-CM

## 2010-11-16 DIAGNOSIS — N739 Female pelvic inflammatory disease, unspecified: Secondary | ICD-10-CM

## 2010-11-16 DIAGNOSIS — N898 Other specified noninflammatory disorders of vagina: Secondary | ICD-10-CM

## 2010-11-16 DIAGNOSIS — M79673 Pain in unspecified foot: Secondary | ICD-10-CM

## 2010-11-16 DIAGNOSIS — N730 Acute parametritis and pelvic cellulitis: Secondary | ICD-10-CM

## 2010-11-16 HISTORY — DX: Female pelvic inflammatory disease, unspecified: N73.9

## 2010-11-16 LAB — POCT UA - MICROSCOPIC ONLY

## 2010-11-16 LAB — POCT URINALYSIS DIPSTICK
Protein, UA: NEGATIVE
Spec Grav, UA: 1.025
Urobilinogen, UA: 1
pH, UA: 6

## 2010-11-16 LAB — POCT WET PREP (WET MOUNT)
Clue Cells Wet Prep HPF POC: NEGATIVE
Yeast Wet Prep HPF POC: NEGATIVE

## 2010-11-16 MED ORDER — SERTRALINE HCL 50 MG PO TABS: 50.0000 mg | ORAL_TABLET | Freq: Every day | ORAL | Status: DC

## 2010-11-16 MED ORDER — CEFTRIAXONE SODIUM 250 MG IJ SOLR
250.0000 mg | Freq: Once | INTRAMUSCULAR | Status: AC
  Administered 2010-11-16: 250 mg via INTRAMUSCULAR

## 2010-11-16 MED ORDER — DOXYCYCLINE HYCLATE 100 MG PO TABS: 100.0000 mg | ORAL_TABLET | Freq: Two times a day (BID) | ORAL | Status: AC

## 2010-11-16 NOTE — Patient Instructions (Signed)
It was good to meet you today I am restarting you on zoloft for your mood I am checking your urine and vaginal area for any infection I will contact you if there are any abnormal results  I am also referring you to sports medicine for your heel, Come back to see me in 2 weeks,  Call with any questions,  God Bless,  Doree Albee MD

## 2010-11-16 NOTE — Assessment & Plan Note (Signed)
Exam somewhat consistent with plantar fasciitis, though with some lower ankle pain on exam. Will refer to [facility] for further management. Pt may benefit from ankle ultrasound for visualization of any tendinopathy.

## 2010-11-16 NOTE — Assessment & Plan Note (Addendum)
Restarted on zoloft today. PHQ-9 score of 15. Psych red flags discussed. No HI/SIWill follow up in 2 weeks. Pt would also benefit greatly from formal psychology/psychiatry referral in the future given social situation. Will also look in to social resources for pt.

## 2010-11-16 NOTE — Assessment & Plan Note (Signed)
Exam consistent with PID. Will treat with rocephin and doxy. Wet prep, GC/Chl, UA also obtained.

## 2010-11-16 NOTE — Progress Notes (Signed)
  Subjective:    Patient ID: Heidi [name], female    DOB: Jul 23, 1980, 30 y.o.   MRN: [id number]  HPI Heel Spur: has been going on for 6-7 months. Pt states that she can walk for up to 1 hour and then p has severe pain. Pt has a boot that she received from urgent care 3 weeks that has had no improvement in function. Pt works at [employer] and is on her feet all day. Pain has been somewhat controlled with mobic.   ? Bladder infection: Has hx/o UTIs and BV. Periumbilical pain x 2 day. Also with increased urinary frequency and urgency. Pt also reports vaginal discharge over last 3-4 days. LMP was one week. + Recent sexual activity with girlfriend. No nausea, vomiting, fevers, no rashes.   Mood: Pt reports hx/o depression in the past that was treated with zoloft. Pt states that the medication help with her mood in the past, however pt states that she took herself off of the medication because she wanted to "manage my mood myself". Pt states that mood has deteriorated since being off of medication. No HI/SI. Has had significant depressed mood. Pt is willing to resuming medication.   -Pt also reports somewhat complex social situation where pt lives with her girlfriend but also has intermittent communication and sexual activity with her husband (no formal divorce). From a social standpoint, her daughter (85 years old) lives with her husband and her son (55 years old) lives with her mother. Pt states that her girlfriend is about to be sent to prison. Per pt, her mother is in the process of filing for custody of the pt's son that lives with her mother. Pt states that she separated from her husband due to drinking activity. Pt currently works at [employer].      Review of Systems See HPI     Objective:   Physical Exam Gen: up in chair, NAD HEENT: NCAT, EOMI, TMs clear bilaterally CV: RRR, no murmurs auscultated PULM: CTAB, no wheezes, rales, rhoncii ABD: + mild  periumbilical TTP GU: normal external genitalia, + vaginal discharge. + CMT, + palpable stool on palpation of posterior vagina EXT: 2+ peripheral pulses   MSK: R foot, ROM WNL,  + TTP along R medial calcaneus, and medial plantar surface of foot.  Assessment & Plan:

## 2010-11-17 LAB — GC/CHLAMYDIA PROBE AMP, GENITAL
Chlamydia, DNA Probe: NEGATIVE
GC Probe Amp, Genital: NEGATIVE

## 2010-11-19 ENCOUNTER — Telehealth: Payer: Self-pay | Admitting: Family Medicine

## 2010-11-19 NOTE — Telephone Encounter (Signed)
Needs to have someone go over her lab results with her.  Please give her a call.

## 2010-11-19 NOTE — Telephone Encounter (Signed)
Will fwd. To PCP. .Slade, Thekla  

## 2010-11-20 ENCOUNTER — Telehealth: Payer: Self-pay | Admitting: Family Medicine

## 2010-11-20 NOTE — Telephone Encounter (Signed)
Heidi [name] is calling back again for her results.

## 2010-11-20 NOTE — Telephone Encounter (Signed)
Called and left VM for pt about lab results. Told pt to call if there were any questions.

## 2010-11-20 NOTE — Telephone Encounter (Signed)
Heidi [name] received letter regarding lab results.  Do not understand numbers.  Would like to go over info with nurse.

## 2010-11-20 NOTE — Telephone Encounter (Signed)
Fwd. To Dr.Newton .Demarea Lorey  

## 2010-11-20 NOTE — Telephone Encounter (Signed)
Patient returned the call 

## 2010-11-21 DIAGNOSIS — M722 Plantar fascial fibromatosis: Secondary | ICD-10-CM

## 2010-11-21 HISTORY — DX: Plantar fascial fibromatosis: M72.2

## 2010-11-30 ENCOUNTER — Ambulatory Visit: Payer: Medicaid Other | Admitting: Family Medicine

## 2010-12-02 ENCOUNTER — Ambulatory Visit: Payer: Medicaid Other | Admitting: Family Medicine

## 2010-12-07 ENCOUNTER — Encounter: Payer: Self-pay | Admitting: Family Medicine

## 2010-12-07 ENCOUNTER — Ambulatory Visit (INDEPENDENT_AMBULATORY_CARE_PROVIDER_SITE_OTHER): Payer: Medicaid Other | Admitting: Family Medicine

## 2010-12-07 VITALS — BP 104/73 | HR 81

## 2010-12-07 DIAGNOSIS — M722 Plantar fascial fibromatosis: Secondary | ICD-10-CM

## 2010-12-08 NOTE — Progress Notes (Signed)
  Subjective:    Patient ID: Heidi [name], female    DOB: [birth year], 30 y.o.   MRN: [id number]  HPI  Right foot pain in arch area for 6 months. Gradual onset. Worse in mornings and after l;ong day of standing. Achy pain.  Stretching and massage makes it some better. Stands 8 hrs a day. Wears tennis shoes usually.   Review of Systems    Pertinent review of systems: negative for fever or unusual weight change.  Objective:   Physical Exam  GEN WD WN NAD overweight FEET B pes planus, broad foot.  Neurovascularly intact B. No rash, no erytema or warmth.  Right foot TTP mid arch and under Flexor Hallucis Brevis muscle belly.  ULTRASOUND:  Plantar fascia is not seen well on either foot secondary to habitus of foot. No  Increased vascualrity. FL H brevis appears intact.      Assessment & Plan:  Pes planus w plantar fasciitis (slightly atypical ) on right Temp insoles w right scaphoid pad.rtc for custom molded orthotics if  This improves pain.

## 2010-12-11 ENCOUNTER — Emergency Department (HOSPITAL_COMMUNITY)
Admission: EM | Admit: 2010-12-11 | Discharge: 2010-12-12 | Disposition: A | Discharge status: Home / Self Care | Payer: Medicaid Other | Attending: Emergency Medicine | Admitting: Emergency Medicine

## 2010-12-11 DIAGNOSIS — L0231 Cutaneous abscess of buttock: Secondary | ICD-10-CM | POA: Insufficient documentation

## 2010-12-11 DIAGNOSIS — F411 Generalized anxiety disorder: Secondary | ICD-10-CM | POA: Insufficient documentation

## 2010-12-11 LAB — CBC
HCT: 34.6 — ABNORMAL LOW
Hemoglobin: 11.1 — ABNORMAL LOW
RBC: 5.32 — ABNORMAL HIGH
WBC: 15.1 — ABNORMAL HIGH

## 2010-12-16 ENCOUNTER — Other Ambulatory Visit: Payer: Self-pay | Admitting: Family Medicine

## 2010-12-21 ENCOUNTER — Ambulatory Visit: Payer: Medicaid Other | Admitting: Family Medicine

## 2010-12-21 DIAGNOSIS — L0591 Pilonidal cyst without abscess: Secondary | ICD-10-CM

## 2010-12-21 HISTORY — DX: Pilonidal cyst without abscess: L05.91

## 2010-12-22 LAB — POCT RAPID STREP A: Streptococcus, Group A Screen (Direct): NEGATIVE

## 2010-12-23 ENCOUNTER — Other Ambulatory Visit: Payer: Self-pay | Admitting: Family Medicine

## 2010-12-23 NOTE — Telephone Encounter (Signed)
Refill request

## 2010-12-28 ENCOUNTER — Ambulatory Visit (INDEPENDENT_AMBULATORY_CARE_PROVIDER_SITE_OTHER): Payer: Medicaid Other | Admitting: Family Medicine

## 2010-12-28 ENCOUNTER — Encounter: Payer: Self-pay | Admitting: Family Medicine

## 2010-12-28 VITALS — BP 105/70 | HR 71 | Wt 188.8 lb

## 2010-12-28 DIAGNOSIS — N6009 Solitary cyst of unspecified breast: Secondary | ICD-10-CM | POA: Insufficient documentation

## 2010-12-28 DIAGNOSIS — F329 Major depressive disorder, single episode, unspecified: Secondary | ICD-10-CM

## 2010-12-28 DIAGNOSIS — Z23 Encounter for immunization: Secondary | ICD-10-CM

## 2010-12-28 DIAGNOSIS — Z309 Encounter for contraceptive management, unspecified: Secondary | ICD-10-CM

## 2010-12-28 NOTE — Assessment & Plan Note (Signed)
Mild bump on medial chest wall. Currently asymptomatic in setting of hx/o multiple breast mass excisions. Discussed red flags for evaluation. Will reassess at next clinical visit.

## 2010-12-28 NOTE — Progress Notes (Signed)
  Subjective:    Patient ID: Heidi [name], female    DOB: [birth year], 30 y.o.   MRN: [id number]  HPI Pt is for follow up on mood. Today patient states that mood is overall much better. From a social standpoint, patient states she's moved back in with her mom to be closer to her children. Patient states she is also back  into the relationship with her husband and is now in marriage counseling. Patient is no longer involved with her  previous girlfriend. Patient states that stress is overall much improved since this changes has been made. No suicidal or homicidal ideations.  Patient does report acute spike in anxiety attacks over the past few weeks. Patient's been using when necessary Xanax for this. Of note patient was recently started on Zoloft for mood. Patient states she's using this on an every other day basis.  Of note, patient was also recently treated for pelvic inflammatory disease at last visit. Today patient states that abdominal pain and vaginal discharge have resolved.  Chest wall bump: Patient reports a chest wall bump on the cleavage line x 3-4 weeks. Patient reports a history of recurrent breast lumps that have been excised by surgery in the past. No history of breast cancer. Patient has been seen in the percent for this in the past. No fevers redness or purulent drainage. Area of bump has been mildly tender.  Review of Systems See HPI     Objective:   Physical Exam Gen: up in chair, NAD HEENT: NCAT, EOMI, TMs clear bilaterally CV: RRR, no murmurs auscultated CHEST: Mild 1cm bump on central chest wall, multiple scars on R and L medial breast  PULM: CTAB, no wheezes, rales, rhoncii ABD: S/NT/+ bowel sounds  EXT: 2+ peripheral pulses   Assessment & Plan:

## 2010-12-28 NOTE — Assessment & Plan Note (Addendum)
Pt requesting rx for plan B at end of visit. Pt states that she has used this several times in the past. Pt states that she recently had unprotected sex with husband 1-2 weeks ago. Pt intermittently compliant with OCPs. Pt unsure if she wants to be pregnant.  Discussed with pt that I do not prescribe plan B because of religious reasons. Also discussed with pt that there is an OTC version that she can purchase without Rx. Also discussed with pt that there were other providers in our practice that were willing to prescribe her this. Pt states that she was willing to follow up on this later as she is considering possibly having children in the future. Also broached issue of long term birth control, which pt is willing to discuss at follow up visit. Pt declined upreg today.

## 2010-12-28 NOTE — Assessment & Plan Note (Addendum)
Overall improved. Stressed importance of daily use of zoloft. Refill for prn xanax also given. Will follow up in 1 month.

## 2010-12-28 NOTE — Patient Instructions (Signed)
It was good to see you today  Use your zoloft as prescribed Come back to see me in 1 month If your breast lump gets any larger, call, and we can re-refer you to the surgeon,  Call with any questions,  God Bless, Doree Albee MD

## 2010-12-29 LAB — WET PREP, GENITAL
Clue Cells Wet Prep HPF POC: NONE SEEN
Trich, Wet Prep: NONE SEEN

## 2010-12-29 LAB — URINALYSIS, ROUTINE W REFLEX MICROSCOPIC
Bilirubin Urine: NEGATIVE
Hgb urine dipstick: NEGATIVE
Nitrite: NEGATIVE
Specific Gravity, Urine: 1.025
pH: 7

## 2010-12-29 LAB — GC/CHLAMYDIA PROBE AMP, GENITAL: Chlamydia, DNA Probe: NEGATIVE

## 2011-01-01 ENCOUNTER — Encounter: Payer: Medicaid Other | Admitting: Family Medicine

## 2011-01-01 LAB — HERPES SIMPLEX VIRUS CULTURE

## 2011-01-04 LAB — URINALYSIS, ROUTINE W REFLEX MICROSCOPIC
Bilirubin Urine: NEGATIVE
Glucose, UA: NEGATIVE
Hgb urine dipstick: NEGATIVE
Leukocytes, UA: NEGATIVE
Nitrite: NEGATIVE
Specific Gravity, Urine: 1.015
Specific Gravity, Urine: 1.015
pH: 5.5
pH: 6.5

## 2011-01-04 LAB — WET PREP, GENITAL
Trich, Wet Prep: NONE SEEN
Trich, Wet Prep: NONE SEEN
Yeast Wet Prep HPF POC: NONE SEEN

## 2011-01-04 LAB — CBC
HCT: 39
Hemoglobin: 12.3
MCHC: 31.6
MCV: 65.8 — ABNORMAL LOW
RBC: 5.93 — ABNORMAL HIGH

## 2011-01-04 LAB — URINE MICROSCOPIC-ADD ON

## 2011-01-06 LAB — URINALYSIS, ROUTINE W REFLEX MICROSCOPIC
Bilirubin Urine: NEGATIVE
Ketones, ur: NEGATIVE
Nitrite: NEGATIVE
Protein, ur: NEGATIVE

## 2011-01-06 LAB — WET PREP, GENITAL: Trich, Wet Prep: NONE SEEN

## 2011-01-07 ENCOUNTER — Inpatient Hospital Stay (INDEPENDENT_AMBULATORY_CARE_PROVIDER_SITE_OTHER)
Admission: RE | Admit: 2011-01-07 | Discharge: 2011-01-07 | Disposition: A | Discharge status: Home / Self Care | Payer: Medicaid Other | Source: Ambulatory Visit | Attending: Family Medicine | Admitting: Family Medicine

## 2011-01-07 DIAGNOSIS — J029 Acute pharyngitis, unspecified: Secondary | ICD-10-CM

## 2011-01-07 DIAGNOSIS — J3489 Other specified disorders of nose and nasal sinuses: Secondary | ICD-10-CM

## 2011-01-07 LAB — POCT RAPID STREP A: Streptococcus, Group A Screen (Direct): NEGATIVE

## 2011-01-11 ENCOUNTER — Encounter: Payer: Medicaid Other | Admitting: Family Medicine

## 2011-01-13 ENCOUNTER — Telehealth: Payer: Self-pay | Admitting: Family Medicine

## 2011-01-13 NOTE — Telephone Encounter (Signed)
Pt will need an appt to discuss this

## 2011-01-13 NOTE — Telephone Encounter (Signed)
Need referral and appt to an allergist to be tested for allegy to epinephrine.  Call patient with info.  This was suggested by her dentist Dr. Excell Seltzer.

## 2011-01-18 ENCOUNTER — Encounter: Payer: Self-pay | Admitting: Family Medicine

## 2011-01-18 ENCOUNTER — Ambulatory Visit (INDEPENDENT_AMBULATORY_CARE_PROVIDER_SITE_OTHER): Payer: Medicaid Other | Admitting: Family Medicine

## 2011-01-18 VITALS — BP 112/74 | HR 66 | Wt 188.0 lb

## 2011-01-18 DIAGNOSIS — M7750 Other enthesopathy of unspecified foot: Secondary | ICD-10-CM

## 2011-01-18 DIAGNOSIS — M79609 Pain in unspecified limb: Secondary | ICD-10-CM

## 2011-01-18 DIAGNOSIS — M659 Synovitis and tenosynovitis, unspecified: Secondary | ICD-10-CM

## 2011-01-18 DIAGNOSIS — M79673 Pain in unspecified foot: Secondary | ICD-10-CM

## 2011-01-18 NOTE — Progress Notes (Signed)
  Subjective:    Patient ID: Heidi [name], female    DOB: [birth year], 30 y.o.   MRN: [id number]  HPI  Followup for bilateral foot pain right greater than left. The sports insults I gave her have significantly improved her symptoms. On review of her initial symptoms: "Right foot pain in arch area for 6 months. Gradual onset. Worse in mornings and after l;ong day of standing. Achy pain. Stretching and massage makes it some better.  Stands 8 hrs a day. Wears tennis shoes usually   Review of Systems   no unusual weight change.  Objective:   Physical Exam  GENERAL: Well-developed female no acute distress Feet: Bilaterally she has loss of the longitudinal arch worse on the right.Right foot TTP mid arch and under Flexor Hallucis Brevis muscle belly Patient was fitted for a : standard, cushioned, semi-rigid orthotic. The orthotic was heated, placed on the orthotic stand. The patient was positioned in subtalar neutral position and 10 degrees of ankle dorsiflexion in a weight bearing stance on the heated orthotic blank After completion of molding, a stable base was applied to the orthotic blank. The blank was ground to a stable position for weight bearing. Blank: blue swirl Base: Blue foam Posting: B foot posts on  medial heel and right foot small first ray post Face to face time spent in evaluation, measurement and manufacture of custom molded orthotic was 40 minutes.        Assessment & Plan:  B foot pain, tendonitis flexor hallucis right Improved with sports insoles Will make custom molded ones today and see how she does rtc prn

## 2011-01-20 ENCOUNTER — Ambulatory Visit (INDEPENDENT_AMBULATORY_CARE_PROVIDER_SITE_OTHER): Payer: Medicaid Other | Admitting: Family Medicine

## 2011-01-20 ENCOUNTER — Encounter: Payer: Self-pay | Admitting: Family Medicine

## 2011-01-20 ENCOUNTER — Telehealth: Payer: Self-pay | Admitting: *Deleted

## 2011-01-20 DIAGNOSIS — N6009 Solitary cyst of unspecified breast: Secondary | ICD-10-CM

## 2011-01-20 DIAGNOSIS — L0591 Pilonidal cyst without abscess: Secondary | ICD-10-CM

## 2011-01-20 MED ORDER — DOXYCYCLINE HYCLATE 100 MG PO TABS: 100.0000 mg | ORAL_TABLET | Freq: Two times a day (BID) | ORAL | Status: AC

## 2011-01-20 NOTE — Telephone Encounter (Signed)
Gave pt info regarding appt

## 2011-01-20 NOTE — Telephone Encounter (Signed)
Called pt. Voice mail not set up yet. Called pt's work. Pt not there yet. Left message with co-worker to call us back. PLEASE TELL PT: appt with Dr.Wakefield Mon 01-25-11 at 11 am. [facility]. Lorenda Hatchet, [name]

## 2011-01-20 NOTE — Patient Instructions (Signed)
Was good to see today I am starting the antibiotics for your pilonidal cyst I'm also referring you to the surgeon for this I will like to also refer you to our mood disorder clinic Come back to see me in 2-4 weeks for general followup visit Call with any questions God Bless,  Doree Albee MD

## 2011-01-21 ENCOUNTER — Encounter: Payer: Self-pay | Admitting: Family Medicine

## 2011-01-21 DIAGNOSIS — L0591 Pilonidal cyst without abscess: Secondary | ICD-10-CM | POA: Insufficient documentation

## 2011-01-21 NOTE — Assessment & Plan Note (Addendum)
Mild active breast cyst. Also with current mild pilonidal cyst. Will place on short course of oral abx. Will also re-refer to surgery.

## 2011-01-21 NOTE — Progress Notes (Signed)
  Subjective:    Patient ID: Heidi [name], female    DOB: 1981/03/22, 30 y.o.   MRN: [id number]  HPI Pt is here for acute problem visit.  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 These have been occuring for several years per pt. Pt states that she has had an acute episode of this over the last 1 week. No systemic sxs including fever, chills. Pt states that she has been noticing some purulent drainage from gluteal cleft. Drainage has been foul smelling. Drainage has been relatively minimal per pt. Pt has been taking warm baths to help with drainage.   Pt also reports hx/o recurrent breast cleft abscesses. Pt states that she has been seen by [facility] for this in the past. Pt states that she has had some mild breast abscesses that have been recurrent over last 2-3 months.   Review of Systems See HPI, otherwise 12 point ROS negative     Objective:   Physical Exam Gen: in bed, NAD SKIN: mildly draining cyst in gluteal fold as well as multiple open cysts in gluteal folds consistent with multiple small pilonidal cysts + <0.5 breast abscess in medial breast cleft       Assessment & Plan:

## 2011-01-21 NOTE — Assessment & Plan Note (Signed)
Will place pt on short course of oral abx in setting of actively draining pilonidal cyst and draining breast abscess. Will refer pt to surgery for evaluation of this, as this has been a recurrent issue.

## 2011-01-25 ENCOUNTER — Ambulatory Visit (INDEPENDENT_AMBULATORY_CARE_PROVIDER_SITE_OTHER): Payer: Self-pay | Admitting: General Surgery

## 2011-01-28 ENCOUNTER — Telehealth: Payer: Self-pay | Admitting: Family Medicine

## 2011-01-28 NOTE — Telephone Encounter (Signed)
Will fwd. To Dr.Newton. Per chart 'pt is allergic to epinephrin' .Arlyss Repress

## 2011-01-28 NOTE — Telephone Encounter (Signed)
Calling from dentist office and needs something faxed there stating that she is not allergic to Epinephrine.  The fax # is [phone].

## 2011-01-29 ENCOUNTER — Encounter: Payer: Self-pay | Admitting: Family Medicine

## 2011-02-03 ENCOUNTER — Telehealth: Payer: Self-pay | Admitting: Family Medicine

## 2011-02-03 NOTE — Telephone Encounter (Signed)
Ms. Heidi [name] is calling back because the dentist office never got the letter saying that the patient is not allergic to Epinephrine.  She is at the dentist office now.

## 2011-02-03 NOTE — Telephone Encounter (Signed)
Letter faxed to [phone] per pt.Barnie Alderman

## 2011-02-08 ENCOUNTER — Emergency Department (HOSPITAL_COMMUNITY): Payer: Medicaid Other

## 2011-02-08 ENCOUNTER — Emergency Department (HOSPITAL_COMMUNITY)
Admission: EM | Admit: 2011-02-08 | Discharge: 2011-02-08 | Disposition: A | Discharge status: Home / Self Care | Payer: Medicaid Other | Attending: Emergency Medicine | Admitting: Emergency Medicine

## 2011-02-08 ENCOUNTER — Encounter (HOSPITAL_COMMUNITY): Payer: Self-pay | Admitting: Emergency Medicine

## 2011-02-08 ENCOUNTER — Other Ambulatory Visit: Payer: Self-pay

## 2011-02-08 DIAGNOSIS — R109 Unspecified abdominal pain: Secondary | ICD-10-CM | POA: Insufficient documentation

## 2011-02-08 DIAGNOSIS — R10814 Left lower quadrant abdominal tenderness: Secondary | ICD-10-CM | POA: Insufficient documentation

## 2011-02-08 DIAGNOSIS — G43909 Migraine, unspecified, not intractable, without status migrainosus: Secondary | ICD-10-CM | POA: Insufficient documentation

## 2011-02-08 DIAGNOSIS — R002 Palpitations: Secondary | ICD-10-CM | POA: Insufficient documentation

## 2011-02-08 LAB — POCT I-STAT, CHEM 8
BUN: 19 mg/dL (ref 6–23)
Calcium, Ion: 1.13 mmol/L (ref 1.12–1.32)
Chloride: 107 mEq/L (ref 96–112)
Creatinine, Ser: 1 mg/dL (ref 0.50–1.10)
Glucose, Bld: 83 mg/dL (ref 70–99)
TCO2: 25 mmol/L (ref 0–100)

## 2011-02-08 LAB — URINALYSIS, ROUTINE W REFLEX MICROSCOPIC
Ketones, ur: NEGATIVE mg/dL
Nitrite: NEGATIVE
Urobilinogen, UA: 1 mg/dL (ref 0.0–1.0)
pH: 6.5 (ref 5.0–8.0)

## 2011-02-08 LAB — URINE MICROSCOPIC-ADD ON

## 2011-02-08 MED ORDER — DOCUSATE SODIUM 100 MG PO CAPS: 100.0000 mg | ORAL_CAPSULE | Freq: Two times a day (BID) | ORAL | Status: AC

## 2011-02-08 MED ORDER — SODIUM CHLORIDE 0.9 % IV BOLUS (SEPSIS)
1000.0000 mL | Freq: Once | INTRAVENOUS | Status: AC
  Administered 2011-02-08: 1000 mL via INTRAVENOUS

## 2011-02-08 MED ORDER — METOCLOPRAMIDE HCL 5 MG/ML IJ SOLN
10.0000 mg | Freq: Once | INTRAMUSCULAR | Status: AC
  Administered 2011-02-08: 10 mg via INTRAVENOUS
  Filled 2011-02-08: qty 2

## 2011-02-08 MED ORDER — DIPHENHYDRAMINE HCL 50 MG/ML IJ SOLN
25.0000 mg | Freq: Once | INTRAMUSCULAR | Status: AC
  Administered 2011-02-08: 25 mg via INTRAVENOUS
  Filled 2011-02-08: qty 1

## 2011-02-08 NOTE — ED Notes (Signed)
Patient is resting comfortably. 

## 2011-02-08 NOTE — ED Notes (Signed)
Patient unable to provide urine sample at this time
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
## 2011-02-08 NOTE — ED Notes (Signed)
Patient transported to Ultrasound 

## 2011-02-08 NOTE — ED Notes (Signed)
Per EMS. Pt's pain began earlier this am with abd pain.  This turned into heart palpitation which in turn became a HA and lower back pain.  EMS states that pt said that she was out last night.  PMH of anxiety

## 2011-02-08 NOTE — ED Notes (Signed)
BMW:[id number]<GM> Expected date:02/08/11<BR> Expected time: 7:04 AM<BR> Means of arrival:Ambulance<BR> Comments:<BR> anxiety

## 2011-02-16 ENCOUNTER — Ambulatory Visit (INDEPENDENT_AMBULATORY_CARE_PROVIDER_SITE_OTHER): Payer: Self-pay | Admitting: General Surgery

## 2011-02-24 ENCOUNTER — Emergency Department (INDEPENDENT_AMBULATORY_CARE_PROVIDER_SITE_OTHER)
Admission: EM | Admit: 2011-02-24 | Discharge: 2011-02-24 | Disposition: A | Discharge status: Home / Self Care | Payer: Medicaid Other | Source: Home / Self Care | Attending: Emergency Medicine | Admitting: Emergency Medicine

## 2011-02-24 ENCOUNTER — Encounter (HOSPITAL_COMMUNITY): Payer: Self-pay | Admitting: *Deleted

## 2011-02-24 DIAGNOSIS — R1033 Periumbilical pain: Secondary | ICD-10-CM

## 2011-02-24 DIAGNOSIS — R109 Unspecified abdominal pain: Secondary | ICD-10-CM

## 2011-02-24 MED ORDER — IBUPROFEN 400 MG PO TABS: 400.0000 mg | ORAL_TABLET | Freq: Four times a day (QID) | ORAL | Status: AC | PRN
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
## 2011-02-24 NOTE — ED Notes (Signed)
Pt with c/o pain abdomen umbilical area - abd soft

## 2011-02-25 ENCOUNTER — Ambulatory Visit: Payer: Medicaid Other | Admitting: Family Medicine

## 2011-02-25 ENCOUNTER — Telehealth: Payer: Self-pay | Admitting: Family Medicine

## 2011-02-25 NOTE — Telephone Encounter (Signed)
Ms. Marlyne Beards transportation did not arrive on time to bring her to appt.  Need to know if she could resched with someone else for shot.  Also if antibiotic could be sent to pharmacy for the PID.  Call patient with info.

## 2011-02-26 NOTE — Telephone Encounter (Signed)
Pt was seen for this in ED earlier today.

## 2011-03-02 ENCOUNTER — Ambulatory Visit: Payer: Medicaid Other | Admitting: Family Medicine

## 2011-03-03 ENCOUNTER — Ambulatory Visit: Payer: Medicaid Other | Admitting: Family Medicine

## 2011-03-09 ENCOUNTER — Ambulatory Visit (INDEPENDENT_AMBULATORY_CARE_PROVIDER_SITE_OTHER): Payer: Self-pay | Admitting: General Surgery

## 2011-03-18 ENCOUNTER — Ambulatory Visit: Payer: Medicaid Other | Admitting: Family Medicine

## 2011-03-18 ENCOUNTER — Encounter (INDEPENDENT_AMBULATORY_CARE_PROVIDER_SITE_OTHER): Payer: Self-pay | Admitting: General Surgery

## 2011-03-29 ENCOUNTER — Ambulatory Visit: Payer: Medicaid Other | Admitting: Family Medicine

## 2011-03-30 ENCOUNTER — Ambulatory Visit: Payer: Medicaid Other | Admitting: Family Medicine

## 2011-04-01 ENCOUNTER — Ambulatory Visit: Payer: Medicaid Other | Admitting: Family Medicine

## 2011-04-01 ENCOUNTER — Encounter: Payer: Self-pay | Admitting: Family Medicine

## 2011-04-07 ENCOUNTER — Ambulatory Visit (INDEPENDENT_AMBULATORY_CARE_PROVIDER_SITE_OTHER): Payer: Medicaid Other | Admitting: Family Medicine

## 2011-04-07 ENCOUNTER — Encounter: Payer: Self-pay | Admitting: Family Medicine

## 2011-04-07 VITALS — BP 118/84 | HR 76 | Ht 64.0 in | Wt 185.0 lb

## 2011-04-07 DIAGNOSIS — F3181 Bipolar II disorder: Secondary | ICD-10-CM

## 2011-04-07 DIAGNOSIS — F3189 Other bipolar disorder: Secondary | ICD-10-CM

## 2011-04-07 DIAGNOSIS — F319 Bipolar disorder, unspecified: Secondary | ICD-10-CM

## 2011-04-07 DIAGNOSIS — F3130 Bipolar disorder, current episode depressed, mild or moderate severity, unspecified: Secondary | ICD-10-CM

## 2011-04-07 MED ORDER — QUETIAPINE FUMARATE 100 MG PO TABS
ORAL_TABLET | ORAL | Status: DC
Start: 2011-04-07 — End: 2011-04-30

## 2011-04-07 NOTE — Patient Instructions (Signed)
It was good to see you today, I am starting you on medication for your mood Come back to see me in 1 week Be sure to set up an appointment with Dr. Pascal Lux up front If you develop any homicidal or suicidal ideations, please give [place] a call or go to the ED.  Call with any questions, God Bless, Doree Albee MD
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
## 2011-04-08 ENCOUNTER — Telehealth: Payer: Self-pay | Admitting: *Deleted

## 2011-04-08 NOTE — Telephone Encounter (Signed)
PA required for Ouetiapine. Form placed in MD box.

## 2011-04-13 NOTE — Telephone Encounter (Signed)
Approval received from [facility] for [place]. Pharmacy notified.

## 2011-04-16 ENCOUNTER — Ambulatory Visit: Payer: Medicaid Other | Admitting: Family Medicine

## 2011-04-21 ENCOUNTER — Ambulatory Visit (INDEPENDENT_AMBULATORY_CARE_PROVIDER_SITE_OTHER): Payer: Medicaid Other | Admitting: Family Medicine

## 2011-04-21 ENCOUNTER — Ambulatory Visit (INDEPENDENT_AMBULATORY_CARE_PROVIDER_SITE_OTHER): Payer: Medicaid Other | Admitting: *Deleted

## 2011-04-21 ENCOUNTER — Encounter: Payer: Self-pay | Admitting: Family Medicine

## 2011-04-21 ENCOUNTER — Ambulatory Visit: Payer: Medicaid Other | Admitting: Family Medicine

## 2011-04-21 VITALS — BP 119/76 | HR 78 | Ht 64.0 in | Wt 181.0 lb

## 2011-04-21 DIAGNOSIS — IMO0001 Reserved for inherently not codable concepts without codable children: Secondary | ICD-10-CM

## 2011-04-21 DIAGNOSIS — M7918 Myalgia, other site: Secondary | ICD-10-CM

## 2011-04-21 DIAGNOSIS — R079 Chest pain, unspecified: Secondary | ICD-10-CM

## 2011-04-21 NOTE — Patient Instructions (Signed)
It was nice to meet you today. For right now, I think we can just watch your arm pain.  Please pay attention to when it is happening, meaning what you are doing, if you are using that arm or shoulder, if you are in a certain kind of position, or if you are turning your neck. Please call if you suddenly develop a rash on that arm. Please come back and see Dr. Alvester Morin in the next few weeks, as previously requested, since you were unable to see him today.

## 2011-04-21 NOTE — Assessment & Plan Note (Signed)
Unclear etiology at this time. Physical exam benign. Since it has only been happening for 2 days, will hold off on doing any kind of lab work or imaging. Could consider C-spine films as this could be a possible cervical radiculopathy if she is still having pain at next visit. Also on the differential would be a brachial plexus impingement from her shoulder but again, no workup is needed at this time. Could also be due to patient being slightly hypersensitive to somatic complaints. Recommended to patient to pay close attention to what she is doing when pain starts and stops. Patient is to followup in the next few weeks as previously requested by her PCP.

## 2011-04-21 NOTE — Progress Notes (Signed)
Patient was scheduled for appointment today at 9:30 with Dr. Alvester Morin. However he is not in clinic this AM. Patient reported pain in left arm now and last night had  pain in left arm and  left chest tightness.  Denies chest pain at this time.  Patient ask for BP to be checked. BP 120/80 pulse 68. States she has history of panic attacks and did feel that she had a panic attack last night during chest pain. Took xanax but she continued with the discomfort. Advised that Dr. Alvester Morin does have available appointment this afternoon but she has to go to work   and  she feels she needs to be seen now . Appointment scheduled with Dr. Fara Boros now.

## 2011-04-21 NOTE — Progress Notes (Signed)
S: Pt comes in today for left arm pain. She had her first episode 2 days ago. When the pain occurs, it is from her fingertips to her shoulder, and with the episode 2 days ago it also went into her left chest. She reports the pain as a tightening and shooting type of pain. The episode 2 days ago lasted approximately 10 minutes and got better after she took a Xanax. This same pain happened again last night and resolved spontaneously after a few minutes. She again started having this pain this morning at approximately 7 AM. She has had multiple episodes since that time. Episodes last for approximately 3 minutes and she then has a 30 minutes to one hour. Pain free before another episode happens. She states that it is her entire arm and a shooting in nature. She states that when these episodes happen, her arm feels limp and weak and she does endorse some tingling in her hand. The weakness and sensory changes resolve when the pain resolves. She states that the pain is made better with pressure and rubbing on her arm. Nothing seems to bring the pain on or make it happen. She is not complaining of pain with movement of her neck or shoulder. She states she had something similar to this happened last year and no diagnosis was ever made.   ROS: Per HPI  History  Smoking status  . Former Smoker  Smokeless tobacco  . Never Used    O:  Filed Vitals:   04/21/11 1104  BP: 119/76  Pulse: 78    Gen: NAD CV: RRR, no murmur Ext: Warm, no chronic skin changes, no edema LUE: full ROM of shoulder, elbow, wrist w/o pain; no erythema or swelling, no tenderness to palpation, 5/5 strength, sensation intact, no reproduction of pain   A/P: 31 y.o. female p/w left arm pain -See problem list -f/u in 1-2 weeks with PCP

## 2011-04-30 ENCOUNTER — Ambulatory Visit (INDEPENDENT_AMBULATORY_CARE_PROVIDER_SITE_OTHER): Payer: Medicaid Other | Admitting: Family Medicine

## 2011-04-30 VITALS — BP 123/75 | HR 93 | Ht 64.0 in | Wt 182.0 lb

## 2011-04-30 DIAGNOSIS — F319 Bipolar disorder, unspecified: Secondary | ICD-10-CM

## 2011-04-30 DIAGNOSIS — F3130 Bipolar disorder, current episode depressed, mild or moderate severity, unspecified: Secondary | ICD-10-CM

## 2011-04-30 DIAGNOSIS — F3189 Other bipolar disorder: Secondary | ICD-10-CM

## 2011-04-30 DIAGNOSIS — F3181 Bipolar II disorder: Secondary | ICD-10-CM

## 2011-04-30 MED ORDER — QUETIAPINE FUMARATE 400 MG PO TABS
ORAL_TABLET | ORAL | Status: DC
Start: 2011-04-30 — End: 2011-05-16

## 2011-04-30 MED ORDER — CLONAZEPAM 0.5 MG PO TABS: 0.5000 mg | ORAL_TABLET | Freq: Two times a day (BID) | ORAL | Status: DC | PRN

## 2011-04-30 NOTE — Assessment & Plan Note (Signed)
Mild lumbar strain associated with work. Instructed patient to use alternating doses of Tylenol and ibuprofen for this. Handout also given. Will followup as needed

## 2011-04-30 NOTE — Assessment & Plan Note (Addendum)
Symptomatically improved today. Though still with some anxiety symptoms. PHQ-9 score 10 today versus 13 from previous visit. MDQ questionnaire with a positive questions of 8 versus 10 from previous. Plan to increase Seroquel to 400 mg at night. Will also transition patient to Klonopin as this benzodiazepine is clinically indicated in bipolar disorder. There is also some consideration of patient being placed back on Zoloft in the setting of history PTSD. However, will defer on doing this pending outpatient followup with Dr. Pascal Lux to better assess overall psychiatric profile. Stressed importance of followup with Dr. Pascal Lux. Plan followup in 3 weeks

## 2011-04-30 NOTE — Patient Instructions (Signed)
It was good to see today I am increasing her Seroquel I am changing her from Xanax to Klonopin Be sure to followup with Dr. Pascal Lux. Get her card before you leave. I'm giving you a shot of Toradol for your back Stop the Excedrin Migraine Alternate between 800 mg of ibuprofen and 1000mg  of Tylenol every 3 hours. Take no more than 3 g of Tylenol per day. Come back to see me in 2 weeks Call with any questions God Bless, Doree Albee MD   Back Pain, Adult Low back pain is very common. About 1 in 5 people have back pain.The cause of low back pain is rarely dangerous. The pain often gets better over time.About half of people with a sudden onset of back pain feel better in just 2 weeks. About 8 in 10 people feel better by 6 weeks.  CAUSES Some common causes of back pain include:  Strain of the muscles or ligaments supporting the spine.   Wear and tear (degeneration) of the spinal discs.   Arthritis.   Direct injury to the back.  DIAGNOSIS Most of the time, the direct cause of low back pain is not known.However, back pain can be treated effectively even when the exact cause of the pain is unknown.Answering your caregiver's questions about your overall health and symptoms is one of the most accurate ways to make sure the cause of your pain is not dangerous. If your caregiver needs more information, he or she may order lab work or imaging tests (X-rays or MRIs).However, even if imaging tests show changes in your back, this usually does not require surgery. HOME CARE INSTRUCTIONS For many people, back pain returns.Since low back pain is rarely dangerous, it is often a condition that people can learn to [facility] their own.   Remain active. It is stressful on the back to sit or stand in one place. Do not sit, drive, or stand in one place for more than 30 minutes at a time. Take short walks on level surfaces as soon as pain allows.Try to increase the length of time you walk each day.   Do not  stay in bed.Resting more than 1 or 2 days can delay your recovery.   Do not avoid exercise or work.Your body is made to move.It is not dangerous to be active, even though your back may hurt.Your back will likely heal faster if you return to being active before your pain is gone.   Pay attention to your body when you bend and lift. Many people have less discomfortwhen lifting if they bend their knees, keep the load close to their bodies,and avoid twisting. Often, the most comfortable positions are those that put less stress on your recovering back.   Find a comfortable position to sleep. Use a firm mattress and lie on your side with your knees slightly bent. If you lie on your back, put a pillow under your knees.   Only take over-the-counter or prescription medicines as directed by your caregiver. Over-the-counter medicines to reduce pain and inflammation are often the most helpful.Your caregiver may prescribe muscle relaxant drugs.These medicines help dull your pain so you can more quickly return to your normal activities and healthy exercise.   Put ice on the injured area.   Put ice in a plastic bag.   Place a towel between your skin and the bag.   Leave the ice on for 15 to 20 minutes, 3 to 4 times a day for the first 2 to 3  days. After that, ice and heat may be alternated to reduce pain and spasms.   Ask your caregiver about trying back exercises and gentle massage. This may be of some benefit.   Avoid feeling anxious or stressed.Stress increases muscle tension and can worsen back pain.It is important to recognize when you are anxious or stressed and learn ways to manage it.Exercise is a great option.  SEEK MEDICAL CARE IF:  You have pain that is not relieved with rest or medicine.   You have pain that does not improve in 1 week.   You have new symptoms.   You are generally not feeling well.  SEEK IMMEDIATE MEDICAL CARE IF:   You have pain that radiates from your back  into your legs.   You develop new bowel or bladder control problems.   You have unusual weakness or numbness in your arms or legs.   You develop nausea or vomiting.   You develop abdominal pain.   You feel faint.  Document Released: 03/08/2005 Document Revised: 11/18/2010 Document Reviewed: 07/27/2010 [employer] [facility] [employer] Patient Information 2012 [place], [place].

## 2011-04-30 NOTE — Progress Notes (Signed)
  Subjective:    Patient ID: Heidi [name], female    DOB: 06-23-1980, 31 y.o.   MRN: [id number]  HPI Psych: Patient is here for general mood followup. Patient was recent started on Seroquel in the setting of history being diagnostic for bipolar disorder with hypomanic features as well as persistent depressive symptoms. Patient is currently on Seroquel 300 mg nightly.  Today, patient states that overall mood has seemed to be improved since last clinical visit and starting  new medication. Patient states that mood does not seem to be as down of as it was from before. No reports of insomnia, pressured speech, or tangential thought. However, patient does report a few episodes of anxiety spells associated with heart palpitations as well as handshaking. Patient is also noted the recently seen here in clinic for left arm pain which may also be related to anxiety. Patient states she's been using using Xanax for this with mom improvement in symptoms. Patient aware refill of Xanax today. No homicidal or suicidal ideations.  From a social standpoint patient is currently living with her mom. Patient is also in the process of being formally divorced from her husband. Patient also states she has had relations with her ex-girlfriend. Patient states that she is seeing her children intermittently. Work is seen to become a little more stressful because of overall workload per patient.  LBP: Patient also reports recurrence of lumbar pain. Patient states she had a history of this approximately one 2 years ago. Patient states that she was lifting some boxes at work and noticed a strain in her back. Patient states she's been using 100 mg as ibuprofen 2-3 times a day with mild to minimal improvement in symptoms. No bowel or bladder anesthesia. Pain is not keeping patient up at night.  Review of Systems See history of present illness otherwise review systems negative    Objective:   Physical Exam Gen: up in chair,  NAD HEENT: NCAT, EOMI, TMs clear bilaterally CV: RRR, no murmurs auscultated PULM: CTAB, no wheezes, rales, rhoncii ABD: S/NT/+ bowel sounds  EXT: 2+ peripheral pulses   MSK: Mild lumbar tenderness to palpation Assessment & Plan:

## 2011-05-11 ENCOUNTER — Telehealth: Payer: Self-pay | Admitting: Family Medicine

## 2011-05-11 NOTE — Telephone Encounter (Signed)
Did not get script for her clonazepam - please fax to  [facility] [place]

## 2011-05-11 NOTE — Telephone Encounter (Signed)
Spoke with patient . States MD did not give her a printed RX at last visit. I called the Rx for klonopin to pharmacy. Patient notified.

## 2011-05-12 ENCOUNTER — Encounter: Payer: Self-pay | Admitting: Family Medicine

## 2011-05-12 ENCOUNTER — Ambulatory Visit (INDEPENDENT_AMBULATORY_CARE_PROVIDER_SITE_OTHER): Payer: Medicaid Other | Admitting: Family Medicine

## 2011-05-12 VITALS — BP 113/79 | HR 82 | Temp 97.7°F | Ht 64.0 in | Wt 182.0 lb

## 2011-05-12 DIAGNOSIS — N760 Acute vaginitis: Secondary | ICD-10-CM

## 2011-05-12 DIAGNOSIS — A6009 Herpesviral infection of other urogenital tract: Secondary | ICD-10-CM | POA: Insufficient documentation

## 2011-05-12 DIAGNOSIS — A6000 Herpesviral infection of urogenital system, unspecified: Secondary | ICD-10-CM

## 2011-05-12 LAB — POCT WET PREP (WET MOUNT): WBC, Wet Prep HPF POC: >20

## 2011-05-12 MED ORDER — ACYCLOVIR 400 MG PO TABS: 400.0000 mg | ORAL_TABLET | Freq: Three times a day (TID) | ORAL | Status: DC

## 2011-05-12 MED ORDER — PANTOPRAZOLE SODIUM 40 MG PO TBEC
40.0000 mg | DELAYED_RELEASE_TABLET | Freq: Every day | ORAL | Status: DC | PRN
Start: 2011-05-12 — End: 2012-01-26

## 2011-05-12 NOTE — Assessment & Plan Note (Addendum)
A: candidal vaginitis.  P: Treat with Diflucan.

## 2011-05-12 NOTE — Assessment & Plan Note (Signed)
A: herpes genitalis recurrent outbreak. P: treat with acyclovir. Provided patient with extra medication to treat recurrent outbreaks.  F?u with PCP.

## 2011-05-12 NOTE — Patient Instructions (Addendum)
Heidi [name],   Thank you for coming in to see me today. I will call you with the results of your wet prep and treat accordingly.   Please pick up and start taking the acyclovir right away. Make sure you always have acyclovir on hand to use as needed.  Dr. Armen Pickup

## 2011-05-12 NOTE — Progress Notes (Signed)
Subjective:     Patient ID: Heidi [name], female   DOB: September 28, 1980, 31 y.o.   MRN: [id number]  HPI History of recurrent bacterial vaginosis and genital herpes. For last 5 days noticed labial irritation and swelling. Noticed a little sore on labia 5 days ago. Thick white vaginal discharge x 5 days. Associated with some itching and mild odor. Sexually active with women. No exposure to semen. Does not share dildos.   Review of Systems Positive: mild  lower abdominal pain.  Negative: fever, chills, nausea or vomiting, rash.      Objective:   Physical Exam BP 113/79  Pulse 82  Temp(Src) 97.7 F (36.5 C) (Oral)  Ht 5\' 4"  (1.626 m)  Wt 182 lb (82.555 kg)  BMI 31.24 kg/m2  LMP 04/21/2011 General appearance: alert, cooperative and no distress Abdomen: soft, non-tender; bowel sounds normal; no masses,  no organomegaly Pelvic: cervix normal in appearance, no adnexal masses or tenderness, no cervical motion tenderness, positive findings: swollen R labia, lower, no fluctuance or induration. Two shallow ulcers R lower labia.  or vaginal discharge:  white, thick, mucoid and malodorous and uterus normal size, shape, and consistency Skin: Skin color, texture, turgor normal. No rashes or lesions  Wet prep done.      Assessment:         Plan:

## 2011-05-13 ENCOUNTER — Telehealth: Payer: Self-pay | Admitting: Family Medicine

## 2011-05-13 MED ORDER — FLUCONAZOLE 150 MG PO TABS: 150.0000 mg | ORAL_TABLET | Freq: Once | ORAL | Status: AC

## 2011-05-13 NOTE — Telephone Encounter (Signed)
The pharmacy has not received the Rx for the Diflucan that Dr. Armen Pickup sent earlier today.

## 2011-05-13 NOTE — Telephone Encounter (Signed)
Rx was not received electronically.  Verbally gave rx for diflucan to pharmacist. Patient notified.

## 2011-05-16 ENCOUNTER — Emergency Department (HOSPITAL_COMMUNITY)
Admission: EM | Admit: 2011-05-16 | Discharge: 2011-05-16 | Disposition: A | Discharge status: Home / Self Care | Payer: Medicaid Other | Attending: Emergency Medicine | Admitting: Emergency Medicine

## 2011-05-16 ENCOUNTER — Encounter (HOSPITAL_COMMUNITY): Payer: Self-pay | Admitting: *Deleted

## 2011-05-16 DIAGNOSIS — F3289 Other specified depressive episodes: Secondary | ICD-10-CM | POA: Insufficient documentation

## 2011-05-16 DIAGNOSIS — F329 Major depressive disorder, single episode, unspecified: Secondary | ICD-10-CM | POA: Insufficient documentation

## 2011-05-16 DIAGNOSIS — F411 Generalized anxiety disorder: Secondary | ICD-10-CM | POA: Insufficient documentation

## 2011-05-16 DIAGNOSIS — R509 Fever, unspecified: Secondary | ICD-10-CM | POA: Insufficient documentation

## 2011-05-16 DIAGNOSIS — J020 Streptococcal pharyngitis: Secondary | ICD-10-CM

## 2011-05-16 MED ORDER — DEXAMETHASONE SODIUM PHOSPHATE 10 MG/ML IJ SOLN
10.0000 mg | Freq: Once | INTRAMUSCULAR | Status: AC
  Administered 2011-05-16: 10 mg via INTRAMUSCULAR
  Filled 2011-05-16: qty 1

## 2011-05-16 MED ORDER — AMOXICILLIN 500 MG PO CAPS: 500.0000 mg | ORAL_CAPSULE | Freq: Three times a day (TID) | ORAL | Status: AC

## 2011-05-16 MED ORDER — HYDROCODONE-ACETAMINOPHEN 7.5-500 MG/15ML PO SOLN: 15.0000 mL | Freq: Four times a day (QID) | ORAL | Status: AC | PRN

## 2011-05-16 MED ORDER — ACETAMINOPHEN 325 MG PO TABS
650.0000 mg | ORAL_TABLET | Freq: Once | ORAL | Status: AC
  Administered 2011-05-16: 650 mg via ORAL
  Filled 2011-05-16: qty 2

## 2011-05-16 NOTE — Discharge Instructions (Signed)
Strep Infections Streptococcal (strep) infections are caused by streptococcal germs (bacteria). Strep infections are very contagious. Strep infections can occur in:  Ears.   The nose.   The throat.   Sinuses.   Skin.   Blood.   Lungs.   Spinal fluid.   Urine.  Strep throat is the most common bacterial infection in children. The symptoms of a Strep infection usually get better in 2 to 3 days after starting medicine that kills germs (antibiotics). Strep is usually not contagious after 36 to 48 hours of antibiotic treatment. Strep infections that are not treated can cause serious complications. These include gland infections, throat abscess, rheumatic fever and kidney disease. DIAGNOSIS  The diagnosis of strep is made by:  A culture for the strep germ.  TREATMENT  These infections require oral antibiotics for a full 10 days, an antibiotic shot or antibiotics given into the vein (intravenous, IV). HOME CARE INSTRUCTIONS   Be sure to finish all antibiotics even if feeling better.   Only take over-the-counter medicines for pain, discomfort and or fever, as directed by your caregiver.   Close contacts that have a fever, sore throat or illness symptoms should see their caregiver right away.   You or your child may return to work, school or daycare if the fever and pain are better in 2 to 3 days after starting antibiotics.  SEEK MEDICAL CARE IF:   You or your child has an oral temperature above 102 F (38.9 C).   Your baby is older than 3 months with a rectal temperature of 100.5 F (38.1 C) or higher for more than 1 day.   You or your child is not better in 3 days.  SEEK IMMEDIATE MEDICAL CARE IF:   You or your child has an oral temperature above 102 F (38.9 C), not controlled by medicine.   Your baby is older than 3 months with a rectal temperature of 102 F (38.9 C) or higher.   Your baby is 3 months old or younger with a rectal temperature of 100.4 F (38 C) or  higher.   There is a spreading rash.   There is difficulty swallowing or breathing.   There is increased pain or swelling.  Document Released: 04/15/2004 Document Revised: 11/18/2010 Document Reviewed: 01/22/2009 [employer] Patient Information 2012 [employer]. 
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
## 2011-05-16 NOTE — ED Notes (Signed)
Pt in c/o sore throat, fever and body aches x3 days

## 2011-05-16 NOTE — ED Provider Notes (Signed)
Medical screening examination/treatment/procedure(s) were performed by non-physician practitioner and as supervising physician I was immediately available for consultation/collaboration.   Xoe Hoe M Iliza Blankenbeckler, MD 05/16/11 2357 

## 2011-05-16 NOTE — ED Notes (Signed)
Pt reports she is a "carrier of strep" pt reports one day of body aches, fever and sore throat. Pt reports pain as severe in throat.

## 2011-05-16 NOTE — ED Notes (Signed)
Tonsils not enlarged. Redness noted. No exudate

## 2011-05-31 ENCOUNTER — Ambulatory Visit (INDEPENDENT_AMBULATORY_CARE_PROVIDER_SITE_OTHER): Payer: Medicaid Other | Admitting: Family Medicine

## 2011-05-31 ENCOUNTER — Encounter: Payer: Self-pay | Admitting: Family Medicine

## 2011-05-31 VITALS — BP 106/73 | HR 71 | Temp 97.8°F | Ht 64.0 in | Wt 179.7 lb

## 2011-05-31 DIAGNOSIS — R51 Headache: Secondary | ICD-10-CM

## 2011-05-31 LAB — CBC WITH DIFFERENTIAL/PLATELET
Basophils Absolute: 0 10*3/uL (ref 0.0–0.1)
Eosinophils Absolute: 0 10*3/uL (ref 0.0–0.7)
Eosinophils Relative: 0 % (ref 0–5)
HCT: 38.3 % (ref 36.0–46.0)
Lymphocytes Relative: 53 % — ABNORMAL HIGH (ref 12–46)
Lymphs Abs: 3.3 10*3/uL (ref 0.7–4.0)
MCH: 20.7 pg — ABNORMAL LOW (ref 26.0–34.0)
MCV: 65.6 fL — ABNORMAL LOW (ref 78.0–100.0)
Monocytes Absolute: 0.4 10*3/uL (ref 0.1–1.0)
Platelets: 263 10*3/uL (ref 150–400)
RDW: 16 % — ABNORMAL HIGH (ref 11.5–15.5)
WBC: 6.1 10*3/uL (ref 4.0–10.5)

## 2011-05-31 MED ORDER — CETIRIZINE HCL 10 MG PO TABS: 10.0000 mg | ORAL_TABLET | Freq: Every day | ORAL | Status: DC

## 2011-05-31 MED ORDER — FLUTICASONE PROPIONATE 50 MCG/ACT NA SUSP
NASAL | Status: DC
Start: 2011-05-31 — End: 2011-06-01

## 2011-05-31 NOTE — Patient Instructions (Addendum)
It was good to see today You likely a combination of sinusitis and a tension headache I do want to get some blood work to rule out other causes I'm prescribing the Zyrtec and Flonase for your sinuses Try to avoid using the Excedrin Migraine. He continues Tylenol instead. Come back to see me next week to followup on your mood Call if any questions,  God Bless, Doree Albee MD

## 2011-06-01 ENCOUNTER — Other Ambulatory Visit: Payer: Self-pay | Admitting: Family Medicine

## 2011-06-01 ENCOUNTER — Encounter (HOSPITAL_COMMUNITY): Payer: Self-pay | Admitting: *Deleted

## 2011-06-01 ENCOUNTER — Telehealth: Payer: Self-pay | Admitting: Family Medicine

## 2011-06-01 ENCOUNTER — Emergency Department (HOSPITAL_COMMUNITY)
Admission: EM | Admit: 2011-06-01 | Discharge: 2011-06-01 | Disposition: A | Discharge status: Home Health | Payer: Medicaid Other | Attending: Emergency Medicine | Admitting: Emergency Medicine

## 2011-06-01 ENCOUNTER — Emergency Department (HOSPITAL_COMMUNITY): Payer: Medicaid Other

## 2011-06-01 DIAGNOSIS — F3289 Other specified depressive episodes: Secondary | ICD-10-CM | POA: Insufficient documentation

## 2011-06-01 DIAGNOSIS — F329 Major depressive disorder, single episode, unspecified: Secondary | ICD-10-CM | POA: Insufficient documentation

## 2011-06-01 DIAGNOSIS — R51 Headache: Secondary | ICD-10-CM | POA: Insufficient documentation

## 2011-06-01 DIAGNOSIS — F411 Generalized anxiety disorder: Secondary | ICD-10-CM | POA: Insufficient documentation

## 2011-06-01 MED ORDER — KETOROLAC TROMETHAMINE 30 MG/ML IJ SOLN
30.0000 mg | Freq: Once | INTRAMUSCULAR | Status: AC
Start: 2011-06-01 — End: 2011-06-01
  Administered 2011-06-01: 30 mg via INTRAMUSCULAR
  Filled 2011-06-01: qty 1

## 2011-06-01 MED ORDER — SUMATRIPTAN SUCCINATE 25 MG PO TABS: 25.0000 mg | ORAL_TABLET | ORAL | Status: DC | PRN

## 2011-06-01 NOTE — Telephone Encounter (Signed)
Pt is also asking for doctors note for her visit yesterday

## 2011-06-01 NOTE — Assessment & Plan Note (Signed)
Likely multifactorial with contributions of sinus and tension headache. Will Rx zyrtec and flonase for sinus contribution. Tylenol for headache as pt has been taking high doses of excedrin migraine with minimal improvement in sxs. Discussed neuro red flags. Will follow as needed.

## 2011-06-01 NOTE — Progress Notes (Signed)
  Subjective:    Patient ID: Heidi [name], female    DOB: [birth year], 31 y.o.   MRN: [id number]  HPI HEADACHE  Onset: 3-4 days   Location: frontal sinus/posterior head  Description: Pain associated with sinus pressure and post head/hneck pai n Modifying factors: hx/o allergic disease as well as temporal artery pain. No vision changes    Symptoms Nausea/vomiting: no Photophobia: no Phonophobia: no Tearing of eyes: no Sinus pain/pressure: yes Relation to menstrual cycle: no  Red Flags Fever: no Neck pain/stiffness: no Vision/speech difficulty: no Focal weakness or numbness: no Altered mental status: no Trauma: no Worse in am: no Anticoagulant use: no Immunocompromise: no  Family history of Migraine: no    Review of Systems See HPI, otherwise ROS negative     Objective:   Physical Exam Gen: up in chair, NAD HEENT: NCAT, EOMI, TMs clear bilaterally, +nasal erythema, rhinorrhea bilaterally, + post oropharyngeal erythema, + temporal TTP bilaterally (mild) CV: RRR, no murmurs auscultated PULM: CTAB, no wheezes, rales, rhoncii ABD: S/NT/+ bowel sounds  EXT: 2+ peripheral pulses Neuro: CN II-XII grossly intact, no focal neuro deficits.          Assessment & Plan:

## 2011-06-01 NOTE — Telephone Encounter (Signed)
Called pt. She is not feeling better. Request work note. Work note done and put up front for pt to pick up. Pt also wants Dr.Newton to call her with the lab results. Lorenda Hatchet, [name]

## 2011-06-01 NOTE — Telephone Encounter (Addendum)
Spoke with patient and she wants to know about labs that were drawn yesterday.  Continues having severe headache.

## 2011-06-01 NOTE — Telephone Encounter (Signed)
Called to discuss headaches symptoms with pt. Patient states she's still having persistent headache which is both posterior and unilateral. Patient been compliant with Zyrtec and Flonase. Discussed lab results with patient. Sedimentation rate within normal limits. Discussed with patient about abortive therapy with Imitrex and patient is to followup with me in the next week. Also discussed neurologic red flags. Patient agreeable plan.
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
## 2011-06-01 NOTE — ED Notes (Signed)
Patient states she woke with headache on the right posterior side of her head.  She has seen her MD with no new dx.  She has tried excedrin w/o relief.  Patient states she is missing work due to pain
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
## 2011-06-01 NOTE — Telephone Encounter (Signed)
Patient is calling because her headache is very severe.  She was in yesterday, but it hasn't let up at all.  She says she is now worried that there could be something seriously wrong.

## 2011-06-03 ENCOUNTER — Telehealth: Payer: Self-pay | Admitting: *Deleted

## 2011-06-03 MED ORDER — LEVONORGESTREL 0.75 MG PO TABS: 0.7500 mg | ORAL_TABLET | Freq: Two times a day (BID) | ORAL | Status: DC

## 2011-06-03 NOTE — Telephone Encounter (Signed)
Pt called back was given info below.

## 2011-06-03 NOTE — Telephone Encounter (Signed)
Pt walked into clinic. Request for Plan B to be send to her pharmacy. Pt had unprotected sex last night. I told the pt, that her PCP is on vacation, but I would ask another doctor. I also told the pt to find out, how much Plan B would cost at [facility]. (I think, it's OTC?) pt said, that she had it before and Medicaid does cover it. She will call me back this afternoon. Will fwd. To Dr.Corey for advise. Lorenda Hatchet, [name]

## 2011-06-03 NOTE — Telephone Encounter (Signed)
I agree that we can Rx plan B for medicaid to pay for it. She should follow up with Dr. Alvester Morin in 1-2 weeks or sooner if needed.  Arlyss Repress will call pt back.

## 2011-06-03 NOTE — Telephone Encounter (Signed)
Plan B sent to [facility] on [address] by Dr.Corey. Waiting for pt to call back. Lorenda Hatchet, [name]

## 2011-06-29 ENCOUNTER — Ambulatory Visit: Payer: Medicaid Other | Admitting: Family Medicine

## 2011-07-05 ENCOUNTER — Encounter: Payer: Self-pay | Admitting: Family Medicine

## 2011-07-05 ENCOUNTER — Ambulatory Visit (INDEPENDENT_AMBULATORY_CARE_PROVIDER_SITE_OTHER): Payer: Medicaid Other | Admitting: Family Medicine

## 2011-07-05 ENCOUNTER — Ambulatory Visit (HOSPITAL_COMMUNITY)
Admission: RE | Admit: 2011-07-05 | Discharge: 2011-07-05 | Disposition: A | Discharge status: Home / Self Care | Payer: Medicaid Other | Source: Ambulatory Visit | Attending: Family Medicine | Admitting: Family Medicine

## 2011-07-05 VITALS — BP 120/82 | Temp 98.5°F | Ht 64.0 in | Wt 183.0 lb

## 2011-07-05 DIAGNOSIS — F319 Bipolar disorder, unspecified: Secondary | ICD-10-CM

## 2011-07-05 DIAGNOSIS — F3130 Bipolar disorder, current episode depressed, mild or moderate severity, unspecified: Secondary | ICD-10-CM

## 2011-07-05 MED ORDER — FLUOXETINE HCL 20 MG PO TABS: 20.0000 mg | ORAL_TABLET | Freq: Every day | ORAL | Status: DC

## 2011-07-05 MED ORDER — CLONAZEPAM 0.5 MG PO TABS: 0.5000 mg | ORAL_TABLET | Freq: Two times a day (BID) | ORAL | Status: DC | PRN

## 2011-07-05 NOTE — Assessment & Plan Note (Signed)
Clinically deteriorated with nidus likely from social/personal stressors. PHQ-9 score of. MDQ score of 7 yes. Will formally refer to psychiatry. Will start pt on prozac as adjunctive therapy to help with depressive sxs. Psych red flags discussed. Will also formally refer case to social work to help with any personal resources.

## 2011-07-05 NOTE — Patient Instructions (Signed)
It was good to see you today  I am starting you on new medication for your mood I am referring you to a psychiatrist Come back to see me in 2 weeks  Call if any questions,  God Bless, Doree Albee MD

## 2011-07-05 NOTE — Progress Notes (Signed)
  Subjective:    Patient ID: Heidi [name], female    DOB: 02-15-1981, 31 y.o.   MRN: [id number]  HPI Pt presents today for general mood follow up.  Pt with a baseline hx/o bipolar disorder that is currently being treated with seroquel. Pt states that mood was overall stable previously with this medication. Pt states that she has had some significant social issues including losing her job, leaving her husband and now living with her girlfriend as well as potientially losing her two children.  Pt currenty denies any HI/SI. However, pt states that her mood has been significantly depressed since these social changes. Pt denies any emotional lability. Pt feels overall safe in her current residence, but does feel overwhelmed with current personal situations.    Review of Systems See HPI, otherwise ROS negative     Objective:   Physical Exam Gen: up in chair, NAD HEENT: NCAT, EOMI, TMs clear bilaterally CV: RRR, no murmurs auscultated PULM: CTAB, no wheezes, rales, rhoncii ABD: S/NT/+ bowel sounds  EXT: 2+ peripheral pulses    Assessment & Plan:

## 2011-07-06 ENCOUNTER — Telehealth: Payer: Self-pay | Admitting: *Deleted

## 2011-07-06 NOTE — Telephone Encounter (Signed)
Attempted to call patient, no answer or voicemail. Dr Alvester Morin put in a psychiatry referral. Patient needs to go to the [facility] center for this, we do not schedule appointments for psychiatry. The [facility] center is a walk in clinic on Monday through Friday 8 am to 3 pm and is located [address], [place]. If she wants to call the number is [phone].Kendric Sindelar, Rodena Medin

## 2011-07-07 ENCOUNTER — Telehealth: Payer: Self-pay | Admitting: Clinical

## 2011-07-07 NOTE — Telephone Encounter (Signed)
Message copied by Theresia Bough on Wed Jul 07, 2011 11:15 AM ------      Message from: Floydene Flock      Created: Mon Jul 05, 2011  8:55 PM       Please see note for full details. Pt has some significant social issues and erratic past behavior. Pt would benefit from formal eval. Thanks.

## 2011-07-07 NOTE — Telephone Encounter (Signed)
Clinical Child psychotherapist (CSW) received a referral to connect pt with community resources pertaining to current job loss, and family/social challenges. CSW was unable to reach or leave a vm at her home number listed. CSW contacted the work number however there was no vm set up. CSW will attempt to call again at a later time.  Theresia Bough, MSW, Theresia Majors [phone]

## 2011-07-08 NOTE — Telephone Encounter (Signed)
Called pt again. Unable to leave message. No voice mail. See previous message, in case pt calls back. Lorenda Hatchet, [name]

## 2011-07-23 ENCOUNTER — Ambulatory Visit: Payer: Medicaid Other | Admitting: Family Medicine

## 2011-07-28 ENCOUNTER — Telehealth: Payer: Self-pay | Admitting: Clinical

## 2011-07-28 NOTE — Telephone Encounter (Signed)
CSW received a referral to assist pt who has a hx of bipolar and significant social issues. CSW left a vm on pt home phone.  CSW has tried to reach pt 2x, CSW will sign off however is available to assist if pt returns call.  Theresia Bough, MSW, Theresia Majors [phone]

## 2011-07-28 NOTE — Telephone Encounter (Signed)
Message copied by Theresia Bough on Wed Jul 28, 2011 11:48 AM ------      Message from: Floydene Flock      Created: Mon Jul 05, 2011  8:55 PM       Please see note for full details. Pt has some significant social issues and erratic past behavior. Pt would benefit from formal eval. Thanks.

## 2011-08-02 ENCOUNTER — Ambulatory Visit (INDEPENDENT_AMBULATORY_CARE_PROVIDER_SITE_OTHER): Payer: Medicaid Other | Admitting: Family Medicine

## 2011-08-02 ENCOUNTER — Encounter: Payer: Self-pay | Admitting: Family Medicine

## 2011-08-02 VITALS — BP 121/78 | HR 77 | Temp 98.8°F | Ht 64.0 in | Wt 184.0 lb

## 2011-08-02 DIAGNOSIS — F3130 Bipolar disorder, current episode depressed, mild or moderate severity, unspecified: Secondary | ICD-10-CM

## 2011-08-02 DIAGNOSIS — F319 Bipolar disorder, unspecified: Secondary | ICD-10-CM

## 2011-08-02 MED ORDER — CLONAZEPAM 0.5 MG PO TABS: 0.5000 mg | ORAL_TABLET | Freq: Two times a day (BID) | ORAL | Status: DC | PRN

## 2011-08-02 MED ORDER — QUETIAPINE FUMARATE 300 MG PO TABS: 300.0000 mg | ORAL_TABLET | Freq: Every day | ORAL | Status: DC

## 2011-08-02 NOTE — Progress Notes (Signed)
  Subjective:    Patient ID: Heidi [name], female    DOB: 05/24/1980, 31 y.o.   MRN: [id number]  HPI Here for follow-up of mood.  Was seen by PCP on 4/15, discussed with patient diagnosis of bipolar disorder.  She was on Seroquel, Prozac was added.  Refilled on clonazepam.  Was referred to social work and psychiatry.  Patient never returned calls.  Patient states continued anxiety.  NO SI, HI.  Main symptoms are inability to sleep, panic attacks.     Review of Systems See HPI    Objective:   Physical Exam  Psychiatric: Her mood appears anxious. Her affect is labile. Her speech is rapid and/or pressured. She is agitated. She exhibits a depressed mood. She expresses no suicidal ideation. She expresses no suicidal plans.   GEN: Alert & Oriented, No acute distress Psych:         Assessment & Plan:

## 2011-08-02 NOTE — Patient Instructions (Signed)
You have to address the cause of your mood and take a prevention medicine- restart seroquel at night  You must call and make an appointment with a psychiatrist today.  Your prescription for clonazepam will need to last you until you see a psychiatrist.  The [facility] center is a walk in clinic on Monday through Friday 8 am to 3 pm and is located [address], [place].  [phone]

## 2011-08-02 NOTE — Assessment & Plan Note (Signed)
Discussed with patient importance of continuing seroquel as a "prevention" medicine- has only been using it and paroxetine on an as needed basis.  Refilled Seroquel and clonazepam today.  Advised she must make appointment with psychiatry for further refills.  Advised her to return calls to Child psychotherapist for additional assistance.  She is agreeable and will let us know if we can be of further assistance.

## 2011-09-10 ENCOUNTER — Encounter: Payer: Self-pay | Admitting: Family Medicine

## 2011-09-10 ENCOUNTER — Other Ambulatory Visit (HOSPITAL_COMMUNITY)
Admission: RE | Admit: 2011-09-10 | Discharge: 2011-09-10 | Disposition: A | Discharge status: Home / Self Care | Payer: Medicaid Other | Source: Ambulatory Visit | Attending: Family Medicine | Admitting: Family Medicine

## 2011-09-10 ENCOUNTER — Ambulatory Visit (INDEPENDENT_AMBULATORY_CARE_PROVIDER_SITE_OTHER): Payer: Medicaid Other | Admitting: Family Medicine

## 2011-09-10 VITALS — BP 122/73 | HR 88 | Temp 98.4°F | Ht 64.0 in | Wt 179.0 lb

## 2011-09-10 DIAGNOSIS — J309 Allergic rhinitis, unspecified: Secondary | ICD-10-CM

## 2011-09-10 DIAGNOSIS — Z113 Encounter for screening for infections with a predominantly sexual mode of transmission: Secondary | ICD-10-CM | POA: Insufficient documentation

## 2011-09-10 DIAGNOSIS — N760 Acute vaginitis: Secondary | ICD-10-CM

## 2011-09-10 DIAGNOSIS — A499 Bacterial infection, unspecified: Secondary | ICD-10-CM

## 2011-09-10 DIAGNOSIS — F411 Generalized anxiety disorder: Secondary | ICD-10-CM

## 2011-09-10 DIAGNOSIS — B9689 Other specified bacterial agents as the cause of diseases classified elsewhere: Secondary | ICD-10-CM

## 2011-09-10 LAB — POCT WET PREP (WET MOUNT)

## 2011-09-10 MED ORDER — METRONIDAZOLE 500 MG PO TABS: 500.0000 mg | ORAL_TABLET | Freq: Two times a day (BID) | ORAL | Status: AC

## 2011-09-10 MED ORDER — FLUCONAZOLE 150 MG PO TABS: 150.0000 mg | ORAL_TABLET | Freq: Once | ORAL | Status: AC

## 2011-09-10 MED ORDER — CLONAZEPAM 0.5 MG PO TABS
0.5000 mg | ORAL_TABLET | Freq: Two times a day (BID) | ORAL | Status: DC | PRN
Start: 2011-09-10 — End: 2011-11-10

## 2011-09-10 NOTE — Patient Instructions (Addendum)
Bacterial Vaginosis Bacterial vaginosis (BV) is a vaginal infection where the normal balance of bacteria in the vagina is disrupted. The normal balance is then replaced by an overgrowth of certain bacteria. There are several different kinds of bacteria that can cause BV. BV is the most common vaginal infection in women of childbearing age. CAUSES   The cause of BV is not fully understood. BV develops when there is an increase or imbalance of harmful bacteria.   Some activities or behaviors can upset the normal balance of bacteria in the vagina and put women at increased risk including:   Having a new sex partner or multiple sex partners.   Douching.   Using an intrauterine device (IUD) for contraception.   It is not clear what role sexual activity plays in the development of BV. However, women that have never had sexual intercourse are rarely infected with BV.  Women do not get BV from toilet seats, bedding, swimming pools or from touching objects around them.  SYMPTOMS   Grey vaginal discharge.   A fish-like odor with discharge, especially after sexual intercourse.   Itching or burning of the vagina and vulva.   Burning or pain with urination.   Some women have no signs or symptoms at all.  DIAGNOSIS  Your caregiver must examine the vagina for signs of BV. Your caregiver will perform lab tests and look at the sample of vaginal fluid through a microscope. They will look for bacteria and abnormal cells (clue cells), a pH test higher than 4.5, and a positive amine test all associated with BV.  RISKS AND COMPLICATIONS   Pelvic inflammatory disease (PID).   Infections following gynecology surgery.   Developing HIV.   Developing herpes virus.  TREATMENT  Sometimes BV will clear up without treatment. However, all women with symptoms of BV should be treated to avoid complications, especially if gynecology surgery is planned. Female partners generally do not need to be treated. However,  BV may spread between female sex partners so treatment is helpful in preventing a recurrence of BV.   BV may be treated with antibiotics. The antibiotics come in either pill or vaginal cream forms. Either can be used with nonpregnant or pregnant women, but the recommended dosages differ. These antibiotics are not harmful to the baby.   BV can recur after treatment. If this happens, a second round of antibiotics will often be prescribed.   Treatment is important for pregnant women. If not treated, BV can cause a premature delivery, especially for a pregnant woman who had a premature birth in the past. All pregnant women who have symptoms of BV should be checked and treated.   For chronic reoccurrence of BV, treatment with a type of prescribed gel vaginally twice a week is helpful.  HOME CARE INSTRUCTIONS   Finish all medication as directed by your caregiver.   Do not have sex until treatment is completed.   Tell your sexual partner that you have a vaginal infection. They should see their caregiver and be treated if they have problems, such as a mild rash or itching.   Practice safe sex. Use condoms. Only have 1 sex partner.  PREVENTION  Basic prevention steps can help reduce the risk of upsetting the natural balance of bacteria in the vagina and developing BV:  Do not have sexual intercourse (be abstinent).   Do not douche.   Use all of the medicine prescribed for treatment of BV, even if the signs and symptoms go away.     Tell your sex partner if you have BV. That way, they can be treated, if needed, to prevent reoccurrence.  SEEK MEDICAL CARE IF:   Your symptoms are not improving after 3 days of treatment.   You have increased discharge, pain, or fever.  MAKE SURE YOU:   Understand these instructions.   Will watch your condition.   Will get help right away if you are not doing well or get worse.  FOR MORE INFORMATION  Division of STD Prevention (DSTDP), Centers for Disease  Control and Prevention: www.cdc.gov/std American Social Health Association (ASHA): www.ashastd.org  Document Released: 03/08/2005 Document Revised: 02/25/2011 Document Reviewed: 08/29/2008 ExitCare Patient Information 2012 [employer]. 

## 2011-09-19 DIAGNOSIS — J309 Allergic rhinitis, unspecified: Secondary | ICD-10-CM | POA: Insufficient documentation

## 2011-09-19 DIAGNOSIS — B9689 Other specified bacterial agents as the cause of diseases classified elsewhere: Secondary | ICD-10-CM | POA: Insufficient documentation

## 2011-09-19 NOTE — Assessment & Plan Note (Signed)
Klonopin refilled.  Continue SSRI.

## 2011-09-19 NOTE — Progress Notes (Signed)
  Subjective:    Patient ID: Heidi [name], female    DOB: 1980-11-12, 31 y.o.   MRN: [id number]  HPI SINUSITIS Onset:  2-3 weeks  Location: sinus pressure,  Description:sinus pressure, nasal congestion, mild headache  Modifying factors: none  Symptoms Cough:  no Discharge:  mild Fever: no Sinus Pressure:  yes Ears Blocked:  no Teeth Ache:  no Frontal Headache:  yes Second Sickening:  no  Red Flags Change in mental state: no Change in vision: no   Pt also reports worsening in baseline anxiety. Has been under a lot of stress 2/2 work related issues as well as issues of custody with children. 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 Has had this on multiple occasions in the past. Mild vaginal discharge. No recent sexual activity per pt.  Review of Systems See HPI, otherwise ROS negative.    Objective:   Physical Exam Gen: up in chair, NAD HEENT: NCAT, EOMI, TMs clear bilaterally, +nasal erythema, rhinorrhea bilaterally, + post oropharyngeal erythema  CV: RRR, no murmurs auscultated PULM: CTAB, no wheezes, rales, rhoncii ABD: S/NT/+ bowel sounds  GU: normal external genitalia, mild vaginal discharge, no CMT EXT: 2+ peripheral pulses         Assessment & Plan:

## 2011-09-19 NOTE — Assessment & Plan Note (Signed)
Wet prep, GC, Chl Will treat with flagyl.

## 2011-09-24 ENCOUNTER — Telehealth: Payer: Self-pay | Admitting: Emergency Medicine

## 2011-09-24 NOTE — Telephone Encounter (Signed)
LMVM to call back. (which meds is pt talking about?) .Arlyss Repress

## 2011-09-24 NOTE — Telephone Encounter (Signed)
Pt called back. Reports 'I have yeast on my tongue. I took the metronidizole'. I advised the pt to brush her tongue, call us back, if not better. Pt took the Diflucan (I told the pt, that the Diflucan should take care of yeast infection). Pt agreed. She will make an appt with her new PCP next week. Lorenda Hatchet, [name]

## 2011-09-24 NOTE — Telephone Encounter (Signed)
Patient is calling because the medication she was prescribed is causing her to have yeast on her tongue that she can't get off with brushing or anything.  She wants to try a different medication.

## 2011-10-06 ENCOUNTER — Telehealth: Payer: Self-pay | Admitting: Emergency Medicine

## 2011-10-06 NOTE — Telephone Encounter (Signed)
Patient called emergency line for reassurance regarding neck position while sleeping.  Concerned that her neck was hyper-extended while sleeping.  No pain or trouble moving the neck.  Provided reassurance that sleeping like might cause some muscle aches, but will not cause fractures or nerve damage.  Expressed understanding.

## 2011-10-26 LAB — TYPE AND SCREEN
ABO/Rh: O POS
Antibody Screen: NEGATIVE

## 2011-10-26 LAB — METABOLIC PANEL, COMPREHENSIVE
A-G Ratio: 0.9 (ref 0.7–2.8)
ALT (SGPT): 23 U/L (ref 12–78)
AST (SGOT): 17 U/L (ref 15–37)
Albumin: 3.8 g/dL (ref 3.5–4.7)
Alk. phosphatase: 91 U/L (ref 50–136)
Anion gap: 13 mmol/L (ref 10–20)
BUN: 8 mg/dL (ref 7–18)
Bilirubin, total: 0.7 mg/dL (ref 0.2–1.0)
CO2: 26 mmol/L (ref 21–32)
Calcium: 8.7 mg/dL (ref 8.5–10.1)
Chloride: 102 mmol/L (ref 98–107)
Creatinine: 0.9 mg/dL (ref 0.6–1.3)
GFR est AA: >60 mL/min/{1.73_m2} (ref >60)
GFR est non-AA: >60 mL/min/{1.73_m2} (ref >60)
Globulin: 4 g/dL (ref 1.7–4.7)
Glucose: 85 mg/dL (ref 74–106)
Potassium: 3.9 mmol/L (ref 3.5–5.1)
Protein, total: 7.8 g/dL (ref 6.4–8.2)
Sodium: 137 mmol/L (ref 136–145)

## 2011-10-26 LAB — CBC WITH AUTOMATED DIFF
ABS. BASOPHILS: 0 10*3/uL (ref 0.0–0.1)
ABS. EOSINOPHILS: 0.2 10*3/uL (ref 0.0–0.7)
ABS. LYMPHOCYTES: 2.1 10*3/uL (ref 1.5–3.5)
ABS. MONOCYTES: 0.5 10*3/uL (ref 0.0–1.0)
ABS. NEUTROPHILS: 3.5 10*3/uL (ref 1.5–6.6)
BASOPHILS: 0 % (ref 0.0–3.0)
EOSINOPHILS: 2 % (ref 0.0–7.0)
HCT: 35.5 % — ABNORMAL LOW (ref 36.0–47.0)
HGB: 11.9 g/dL — ABNORMAL LOW (ref 12.0–16.0)
IMMATURE GRANULOCYTES: 0.3 % (ref 0.0–2.0)
LYMPHOCYTES: 33 % (ref 18.0–40.0)
MCH: 28.9 PG (ref 27.0–35.0)
MCHC: 33.5 g/dL (ref 30.7–37.3)
MCV: 86.2 FL (ref 81.0–94.0)
MONOCYTES: 7 % (ref 2.0–12.0)
MPV: 9.9 FL (ref 9.2–11.8)
NEUTROPHILS: 58 % (ref 48.0–72.0)
PLATELET: 393 10*3/uL (ref 130–400)
RBC: 4.12 M/uL — ABNORMAL LOW (ref 4.20–5.40)
RDW: 13.9 % (ref 11.5–14.0)
WBC: 6.2 10*3/uL (ref 4.8–10.6)

## 2011-10-26 LAB — PROTHROMBIN TIME + INR
INR: 1 (ref 0.8–1.2)
Prothrombin time: 10.1 s (ref 9.4–11.1)

## 2011-10-26 LAB — HCG QL SERUM: HCG, Ql.: NEGATIVE

## 2011-10-26 LAB — TYPE & SCREEN
ABO/Rh(D): O POS
Antibody screen: NEGATIVE

## 2011-10-26 LAB — PTT: aPTT: 22.4 s (ref 22.0–28.0)

## 2011-10-26 NOTE — ED Notes (Signed)
Discharged to home with verbal understanding of instructions

## 2011-10-26 NOTE — ED Provider Notes (Signed)
HPI Comments: Pt presented with chief complaint of headache, nausea, vomiting, photophobia x 24hours. Pt traveling from [place], here visiting friends. Initially, pt denies having similar episodes in the past. 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  Pt states that she has been unable to tolerate PO intake x 24 hours. Headache 10/10. Denies visual changes, denies recent falls/injuries. States that she is dizzy and must ambulate with assistance for past 24 hours.   Accompanied by friend at bedside. Pt denies possibility of pregnancy - states that she has IUD in place.       The history is provided by the patient.        No past medical history on file.     No past surgical history on file.      No family history on file.     History     Social History   ??? Marital Status: N/A     Spouse Name: N/A     Number of Children: N/A   ??? Years of Education: N/A     Occupational History   ??? Not on file.     Social History Main Topics   ??? Smoking status: Not on file   ??? Smokeless tobacco: Not on file   ??? Alcohol Use: Not on file   ??? Drug Use: Not on file   ??? Sexually Active: Not on file     Other Topics Concern   ??? Not on file     Social History Narrative   ??? No narrative on file                  ALLERGIES: Review of patient's allergies indicates not on file.      Review of Systems   Constitutional: Positive for activity change and fatigue.   HENT: Positive for neck pain and neck stiffness. Negative for ear pain, rhinorrhea, trouble swallowing, sinus pressure, tinnitus and ear discharge.         Severe neck stiffness and pain, limited ROM   Eyes: Positive for photophobia.   Respiratory: Negative.    Cardiovascular: Negative.    Gastrointestinal: Positive for nausea and vomiting.        States that nausea and vomiting began at the same time as headache. Unable to tolerate any PO for past 24 hours   Genitourinary: Negative.    Skin: Negative.    Neurological:  Positive for dizziness, weakness, numbness and headaches. Negative for seizures, syncope and facial asymmetry.        Pt reports severe dizziness, unable to walk without assistance. Severe headache reported, states that is different from her usual migraines. Headache unchanged after taking advil, aleve, excedrin yesterday.        There were no vitals filed for this visit.         Physical Exam   Constitutional: She is oriented to person, place, and time. She appears well-developed and well-nourished.  Non-toxic appearance.   HENT:   Head: Normocephalic and atraumatic.   Nose: Nose normal. No epistaxis. Right sinus exhibits no maxillary sinus tenderness and no frontal sinus tenderness. Left sinus exhibits no maxillary sinus tenderness and no frontal sinus tenderness.   Eyes: Conjunctivae and EOM are normal. Pupils are equal, round, and reactive to light.   Neck: Normal range of motion and full passive range of motion without pain. Neck supple. No muscular tenderness present. No Brudzinski's sign and  no Kernig's sign noted.        Limited ROM of patient, however full PROM by examiner   Cardiovascular: Normal rate, regular rhythm, normal heart sounds and normal pulses.         EKG showed NSR, no abnormalities, as read by Dr. Hanley Seamen   Pulmonary/Chest: Effort normal and breath sounds normal.   Abdominal: Soft. Normal appearance and bowel sounds are normal. There is no tenderness.   Musculoskeletal: Normal range of motion.        Cervical back: Normal.   Neurological: She is alert and oriented to person, place, and time. She has normal strength. No cranial nerve deficit or sensory deficit. GCS eye subscore is 4. GCS verbal subscore is 5. GCS motor subscore is 6.   Skin: Skin is warm and dry.        MDM     Amount and/or Complexity of Data Reviewed:   Clinical lab tests:  Ordered and reviewed  Tests in the radiology section of CPT??:  Ordered and reviewed  Tests in the medicine section of the CPT??:  Ordered and  reviewed   Obtain history from someone other than the patient:  Yes   Discuss the patient with another provider:  Yes      Procedures      Labs:  Recent Results (from the past 12 hour(s))   CBC WITH AUTOMATED DIFF    Collection Time    10/26/11 10:29 AM       Component Value Range    WBC 6.2  4.8 - 10.6 K/uL    RBC 4.12 (*) 4.20 - 5.40 M/uL    HGB 11.9 (*) 12.0 - 16.0 g/dL    HCT 16.1 (*) 09.6 - 47.0 %    MCV 86.2  81.0 - 94.0 FL    MCH 28.9  27.0 - 35.0 PG    MCHC 33.5  30.7 - 37.3 g/dL    RDW 04.5  40.9 - 81.1 %    PLATELET 393  130 - 400 K/uL    MPV 9.9  9.2 - 11.8 FL    NEUTROPHILS 58  48.0 - 72.0 %    LYMPHOCYTES 33  18.0 - 40.0 %    MONOCYTES 7  2.0 - 12.0 %    EOSINOPHILS 2  0.0 - 7.0 %    BASOPHILS 0  0.0 - 3.0 %    ABS. NEUTROPHILS 3.5  1.5 - 6.6 K/UL    ABS. LYMPHOCYTES 2.1  1.5 - 3.5 K/UL    ABS. MONOCYTES 0.5  0.0 - 1.0 K/UL    ABS. EOSINOPHILS 0.2  0.0 - 0.7 K/UL    ABS. BASOPHILS 0.0  0.0 - 0.1 K/UL    DF AUTOMATED      IMMATURE GRANULOCYTES 0.3  0.0 - 2.0 %   METABOLIC PANEL, COMPREHENSIVE    Collection Time    10/26/11 10:29 AM       Component Value Range    Sodium 137  136 - 145 mmol/L    Potassium 3.9  3.5 - 5.1 mmol/L    Chloride 102  98 - 107 mmol/L    CO2 26  21 - 32 mmol/L    Anion gap 13  10 - 20 mmol/L    Glucose 85  74 - 106 mg/dL    BUN 8  7 - 18 mg/dL    Creatinine 0.9  0.6 - 1.3 mg/dL    GFR est-AA >91  >47 ml/min/1.33m2  GFR est non-AA >60  >60 ml/min/1.90m2    Calcium 8.7  8.5 - 10.1 mg/dL    Bilirubin, total 0.7  0.2 - 1.0 mg/dL    ALT 23  12 - 78 U/L    AST 17  15 - 37 U/L    Alk. phosphatase 91  50 - 136 U/L    Protein, total 7.8  6.4 - 8.2 g/dL    Albumin 3.8  3.5 - 4.7 g/dL    Globulin 4.0  1.7 - 4.7 g/dL    A-G Ratio 0.9  0.7 - 2.8     TYPE & SCREEN    Collection Time    10/26/11 10:29 AM       Component Value Range    Crossmatch Expiration 10/29/2011      ABO/Rh(D) O POS      Antibody screen NEG     PTT    Collection Time    10/26/11 10:29 AM       Component Value Range    aPTT 22.4   22.0 - 28.0 SEC   PROTHROMBIN TIME    Collection Time    10/26/11 10:29 AM       Component Value Range    Prothrombin time 10.1  9.4 - 11.1 sec    INR 1.0  0.8 - 1.2     HCG QL SERUM    Collection Time    10/26/11 10:29 AM       Component Value Range    HCG, Ql. NEGATIVE   NEGATIVE       Radiology: CXR shows no acute pulmonary process  CT head (-): Unremarkable     Re-evaluations: 1330: Pt returned from CT scan, continues to c/o pain. States that nausea is resolved. Toradol ordered. CT, CXR results reviewed with patient.  1400: RN reported that PIV no longer patent, was removed. Discussed options with pt - RN to attempt another PIV placement for remainder of initial NS bolus. Pt agrees with plan.  1600: Pt denies c/o nausea, tolerating PO fluids. Appears more comfortable, able to tolerate upright position without worsening of HA. Ambulating with steady gait. Stable for D/C home with PO pain management      <EMERGENCY DEPARTMENT CASE SUMMARY>    Impression/Differential Diagnosis: R/o CVA, migraine    Plan: EKG, CXR, CT head, pain management, labs, lyme titers, IV fluids, anti-emetics    ED Course: EKG, CXR, CT head all normal. Pain management provided - pt appearing more comfortable. Stable for D/C home. With PO pain management.   Discussed plan of care with pt, who agrees with D/C plan.     Final Impression/Diagnosis:   1. Migraine, unspecified, with intractable migraine, so stated, without mention of status migrainosus        Patient condition at time of disposition: Stable      I have reviewed the following home medications:    Prior to Admission medications    Medication Sig Start Date End Date Taking? Authorizing Provider   DULoxetine (CYMBALTA) 20 mg capsule Take 20 mg by mouth daily.   Yes Phys Other, MD   fluticasone-salmeterol (ADVAIR DISKUS) 100-50 mcg/dose diskus inhaler Take 1 Puff by inhalation every twelve (12) hours.   Yes Phys Other, MD       Marena Chancy, NP

## 2011-10-26 NOTE — ED Notes (Signed)
Pt evaluated by NP [place], labs drawn and sent, Cxray done, pt signed a pregnancy waiver and awaiting for CT of head, NS infusing without any s/s of inflammation or infiltration, pt given pain medication, pt in NAD, awaiting for further evaluation and disposition.

## 2011-10-26 NOTE — Procedures (Unsigned)
[facility]  EKG                                            Name: Danielle Mcgrath, Danielle Mcgrath                                            MR#: [id number]                                            Account #: [id number]                                            Date of Adm: 10/26/2011        Facility [facility]  DocId [id number]  [id number].      ORDER NUMBER: [id number]  ITEM: [id number] EKG  CONFIRMING PHYSICIAN: Jake Church, MD  REPORT: FINAL  OBSERVATION DATE/TIME: 10/26/2011 10:02    QRS Interval: 92  QT Interval: 356  QTC Interval: 419  P Axis: 26  QRS Axis: 46  T Wave Axis: 12  P-R Interval: 172  Heart Rate: 83  AGE IS NOT ENTERED, ASSUMED TO BE32 YEARS OLD FOR PURPOSE OF ECG  INTERPRETATION  SINUS RHYTHM  PROBABLE ANTEROSEPTAL INFARCT, AGE INDETERM                ekg    CS Doc#:  [id number]                                    Page 1 of 1

## 2011-10-26 NOTE — ED Notes (Signed)
Pt c/o headache, neck pain, nausea and vomiting.

## 2011-10-26 NOTE — ED Notes (Signed)
Ns 500 cc infused.  Patient to be discharged.

## 2011-10-26 NOTE — ED Notes (Signed)
Pt went to CT, in NAD.

## 2011-10-27 LAB — LYME IGG,IGM W RFLX CONFIRM: LYME, TOTAL IGG/IGM: NEGATIVE

## 2011-10-27 NOTE — ED Provider Notes (Addendum)
HPI Comments: Arrives via EMS complains of severe headache with fever, photophobia, and vomiting.Seen in ED yesterday for the same. Had no fever at that time. Patient states she has a history of viral meningitis. Feels symptoms may be related.     Patient is a 31 y.o. female presenting with migraines and vomiting. The history is provided by the patient.   Migraine   This is a recurrent problem. The current episode started more than 2 days ago. The problem occurs constantly. The problem has been gradually worsening. The headache is aggravated by vomiting, fever and photophobia. The pain is located in the right unilateral region. The quality of the pain is described as dull and throbbing. The pain is at a severity of 9/10. The pain is severe. Associated symptoms include a fever, nausea and vomiting. Pertinent negatives include no weakness and no dizziness. She has tried darkened room and rest for the symptoms. The treatment provided no relief.   Vomiting   Associated symptoms include a fever, headaches and headaches.        Past Medical History   Diagnosis Date   ??? Asthma    ??? Viral meningitis         Past Surgical History   Procedure Date   ??? Hx cholecystectomy    ??? Hx gyn      c-section   ??? Hx appendectomy          History reviewed. No pertinent family history.     History     Social History   ??? Marital Status: SINGLE     Spouse Name: N/A     Number of Children: N/A   ??? Years of Education: N/A     Occupational History   ??? Not on file.     Social History Main Topics   ??? Smoking status: Never Smoker    ??? Smokeless tobacco: Not on file   ??? Alcohol Use: No   ??? Drug Use: No   ??? Sexually Active:      Other Topics Concern   ??? Not on file     Social History Narrative   ??? No narrative on file                  ALLERGIES: Penicillins      Review of Systems   Constitutional: Positive for fever.   Eyes: Positive for photophobia. Negative for pain and visual disturbance.   Cardiovascular: Negative.    Gastrointestinal: Positive  for nausea and vomiting.   Musculoskeletal:        Neck pain   Skin: Negative.    Neurological: Positive for headaches. Negative for dizziness, tremors, seizures, syncope, facial asymmetry, speech difficulty, weakness, light-headedness and numbness.   Psychiatric/Behavioral: Negative.        Filed Vitals:    10/27/11 1937 10/27/11 2026   BP: 143/69    Pulse: 69    Temp: 100.3 ??F (37.9 ??C)    Resp: 20    Height:  5\' 11"  (1.803 m)   Weight:  154.223 kg (340 lb)   SpO2: 100%             Physical Exam   Constitutional: She is oriented to person, place, and time. She appears well-developed and well-nourished. She appears distressed.   HENT:   Head: Normocephalic and atraumatic.   Right Ear: External ear normal.   Left Ear: External ear normal.   Mouth/Throat: Oropharynx is clear and moist.  No petechiae noted in oral cavity   Eyes: Conjunctivae and EOM are normal. Pupils are equal, round, and reactive to light. Right eye exhibits no discharge. Left eye exhibits no discharge.        + photophobia. No papilledema noted    Neck: Neck supple. Brudzinski's sign and Kernig's sign noted.        + brudzinski sign    Cardiovascular: Normal rate, regular rhythm, normal heart sounds and intact distal pulses.    Pulmonary/Chest: Effort normal and breath sounds normal.   Abdominal: Soft. Bowel sounds are normal.   Lymphadenopathy:     She has no cervical adenopathy.   Neurological: She is alert and oriented to person, place, and time. She has normal strength and normal reflexes. No cranial nerve deficit or sensory deficit. GCS eye subscore is 4. GCS verbal subscore is 5. GCS motor subscore is 6.   Skin: Skin is warm and dry. No petechiae, no purpura and no rash noted.   Psychiatric: She has a normal mood and affect. Her behavior is normal. Judgment and thought content normal.      Recent Results (from the past 12 hour(s))   CBC WITH AUTOMATED DIFF    Collection Time    10/27/11  9:10 PM       Component Value Range    WBC 7.5  4.8  - 10.6 K/uL    RBC 4.23  4.20 - 5.40 M/uL    HGB 12.0  12.0 - 16.0 g/dL    HCT 16.1  09.6 - 04.5 %    MCV 87.0  81.0 - 94.0 FL    MCH 28.4  27.0 - 35.0 PG    MCHC 32.6  30.7 - 37.3 g/dL    RDW 40.9  81.1 - 91.4 %    PLATELET 356  130 - 400 K/uL    MPV 10.0  9.2 - 11.8 FL    NEUTROPHILS 52  48.0 - 72.0 %    LYMPHOCYTES 36  18.0 - 40.0 %    MONOCYTES 6  2.0 - 12.0 %    EOSINOPHILS 6  0.0 - 7.0 %    BASOPHILS 0  0.0 - 3.0 %    ABS. NEUTROPHILS 3.9  1.5 - 6.6 K/UL    ABS. LYMPHOCYTES 2.7  1.5 - 3.5 K/UL    ABS. MONOCYTES 0.5  0.0 - 1.0 K/UL    ABS. EOSINOPHILS 0.4  0.0 - 0.7 K/UL    ABS. BASOPHILS 0.0  0.0 - 0.1 K/UL    DF AUTOMATED      IMMATURE GRANULOCYTES 0.0  0.0 - 2.0 %   METABOLIC PANEL, BASIC    Collection Time    10/27/11  9:10 PM       Component Value Range    Sodium 139  136 - 145 mmol/L    Potassium 3.6  3.5 - 5.1 mmol/L    Chloride 104  98 - 107 mmol/L    CO2 25  21 - 32 mmol/L    Anion gap 13  10 - 20 mmol/L    Glucose 108 (*) 74 - 106 mg/dL    BUN 10  7 - 18 mg/dL    Creatinine 1.0  0.6 - 1.3 mg/dL    GFR est-AA >78  >29 ml/min/1.20m2    GFR est non-AA >60  >60 ml/min/1.76m2    Calcium 8.7  8.5 - 10.1 mg/dL   PROTHROMBIN TIME    Collection Time    10/27/11  9:10 PM       Component Value Range    Prothrombin time 9.8  9.4 - 11.1 sec    INR 1.0  0.8 - 1.2     PTT    Collection Time    10/27/11  9:10 PM       Component Value Range    aPTT 24.7  22.0 - 28.0 SEC   LACTIC ACID, PLASMA    Collection Time    10/27/11  9:10 PM       Component Value Range    Lactic acid 1.4  0.4 - 2.0 MMOL/L   HCG QL SERUM    Collection Time    10/27/11  9:10 PM       Component Value Range    HCG, Ql. NEGATIVE   NEGATIVE       <EMERGENCY DEPARTMENT CASE SUMMARY>    Impression/Differential Diagnosis: migraine headache vs meningitis vs intracranial bleed    Plan: history, physical, labs, repeat CT head, pain relief     ED Course: labs, repeat CT of head, pain medication with + relief, case presented to Dr. Baxter Flattery    Final Impression/Diagnosis:  rule out meningitis    Patient condition at time of disposition: stable      I have reviewed the following home medications:    Prior to Admission medications    Medication Sig Start Date End Date Taking? Authorizing Provider   DULoxetine (CYMBALTA) 20 mg capsule Take 20 mg by mouth daily.    Phys Other, MD   fluticasone-salmeterol (ADVAIR DISKUS) 100-50 mcg/dose diskus inhaler Take 1 Puff by inhalation every twelve (12) hours.    Phys Other, MD     Case reported off to Dr. Vivi Barrack, NP      MDM     Amount and/or Complexity of Data Reviewed:   Clinical lab tests:  Ordered and reviewed  Tests in the radiology section of CPT??:  Ordered and reviewed   Decide to obtain previous medical records or to obtain history from someone other than the patient:  Yes   Review and summarize past medical records:  Yes   Independant visualization of image, tracing, or specimen:  Yes      Procedures    I was personally available for consultation in the emergency department. I have reviewed the chart and agree with the documentation recorded by the midlevel provider, including the assessment, treatment plan, and disposition.    Malachi Carl, MD

## 2011-10-27 NOTE — ED Notes (Signed)
ua c&s labelled and sent. Pt medicated for pain. Head ct completed.

## 2011-10-27 NOTE — ED Notes (Signed)
C/o headache for 3 days taking excedrin, seen in ER yesterday  Still has headache and nausea

## 2011-10-27 NOTE — Undefined (Cosign Needed)
Case presented to Dr. Lissa Hoard. Will cover patient with antibiotic coverage for continued headache, fever, and neck pain. Patient to have lumbar puncture. Dr. Lissa Hoard will admit based on results of lumbar puncture. Patient consents to lumbar puncture and agrees to be admitted to the hospital for further treatment.

## 2011-10-27 NOTE — ED Notes (Signed)
Report given to Kim D RN

## 2011-10-27 NOTE — ED Notes (Signed)
Pt resting at this time. Awaiting ct scan.

## 2011-10-28 ENCOUNTER — Inpatient Hospital Stay
Admit: 2011-10-28 | Discharge: 2011-11-05 | Discharge status: Against Medical Advice | Source: Home / Self Care | Attending: Family Medicine | Admitting: Family Medicine

## 2011-10-28 DIAGNOSIS — G039 Meningitis, unspecified: Secondary | ICD-10-CM

## 2011-10-28 LAB — METABOLIC PANEL, COMPREHENSIVE
A-G Ratio: 0.8 (ref 0.7–2.8)
ALT (SGPT): 27 U/L (ref 12–78)
AST (SGOT): 27 U/L (ref 15–37)
Albumin: 3.4 g/dL — ABNORMAL LOW (ref 3.5–4.7)
Alk. phosphatase: 86 U/L (ref 50–136)
Anion gap: 15 mmol/L (ref 10–20)
BUN: 10 mg/dL (ref 7–18)
Bilirubin, total: 0.6 mg/dL (ref 0.2–1.0)
CO2: 22 mmol/L (ref 21–32)
Calcium: 8.5 mg/dL (ref 8.5–10.1)
Chloride: 105 mmol/L (ref 98–107)
Creatinine: 0.9 mg/dL (ref 0.6–1.3)
GFR est AA: >60 mL/min/{1.73_m2} (ref >60)
GFR est non-AA: >60 mL/min/{1.73_m2} (ref >60)
Globulin: 4.2 g/dL (ref 1.7–4.7)
Glucose: 70 mg/dL — ABNORMAL LOW (ref 74–106)
Potassium: 4 mmol/L (ref 3.5–5.1)
Protein, total: 7.6 g/dL (ref 6.4–8.2)
Sodium: 138 mmol/L (ref 136–145)

## 2011-10-28 LAB — CBC WITH AUTOMATED DIFF
ABS. BASOPHILS: 0 10*3/uL (ref 0.0–0.1)
ABS. BASOPHILS: 0 10*3/uL (ref 0.0–0.1)
ABS. EOSINOPHILS: 0.4 10*3/uL (ref 0.0–0.7)
ABS. EOSINOPHILS: 0.4 10*3/uL (ref 0.0–0.7)
ABS. LYMPHOCYTES: 2.7 10*3/uL (ref 1.5–3.5)
ABS. LYMPHOCYTES: 2.7 10*3/uL (ref 1.5–3.5)
ABS. MONOCYTES: 0.5 10*3/uL (ref 0.0–1.0)
ABS. MONOCYTES: 0.6 10*3/uL (ref 0.0–1.0)
ABS. NEUTROPHILS: 3.9 10*3/uL (ref 1.5–6.6)
ABS. NEUTROPHILS: 2.5 10*3/uL (ref 1.5–6.6)
BASOPHILS: 0 % (ref 0.0–3.0)
BASOPHILS: 0 % (ref 0.0–3.0)
EOSINOPHILS: 6 % (ref 0.0–7.0)
EOSINOPHILS: 6 % (ref 0.0–7.0)
HCT: 36.8 % (ref 36.0–47.0)
HCT: 37.1 % (ref 36.0–47.0)
HGB: 12 g/dL (ref 12.0–16.0)
HGB: 12.2 g/dL (ref 12.0–16.0)
IMMATURE GRANULOCYTES: 0 % (ref 0.0–2.0)
IMMATURE GRANULOCYTES: 0.2 % (ref 0.0–2.0)
LYMPHOCYTES: 36 % (ref 18.0–40.0)
LYMPHOCYTES: 43 % — ABNORMAL HIGH (ref 18.0–40.0)
MCH: 28.4 PG (ref 27.0–35.0)
MCH: 28.9 PG (ref 27.0–35.0)
MCHC: 32.6 g/dL (ref 30.7–37.3)
MCHC: 32.9 g/dL (ref 30.7–37.3)
MCV: 87 FL (ref 81.0–94.0)
MCV: 87.9 FL (ref 81.0–94.0)
MONOCYTES: 6 % (ref 2.0–12.0)
MONOCYTES: 10 % (ref 2.0–12.0)
MPV: 10 FL (ref 9.2–11.8)
MPV: 9.6 FL (ref 9.2–11.8)
NEUTROPHILS: 52 % (ref 48.0–72.0)
NEUTROPHILS: 41 % — ABNORMAL LOW (ref 48.0–72.0)
PLATELET: 356 10*3/uL (ref 130–400)
PLATELET: 355 10*3/uL (ref 130–400)
RBC: 4.23 M/uL (ref 4.20–5.40)
RBC: 4.22 M/uL (ref 4.20–5.40)
RDW: 13.6 % (ref 11.5–14.0)
RDW: 13.7 % (ref 11.5–14.0)
WBC: 7.5 10*3/uL (ref 4.8–10.6)
WBC: 6.3 10*3/uL (ref 4.8–10.6)

## 2011-10-28 LAB — URINALYSIS W/ RFLX MICROSCOPIC
Bilirubin: NEGATIVE
Blood: NEGATIVE
Glucose: NEGATIVE MG/DL
Ketone: NEGATIVE MG/DL
Leukocyte Esterase: NEGATIVE
Nitrites: NEGATIVE
Protein: NEGATIVE MG/DL
Specific gravity: 1.01 (ref 1.003–1.030)
Urobilinogen: 0.2 EU/DL (ref 0.2–1.0)
pH (UA): 7.5 (ref 4.6–8.0)

## 2011-10-28 LAB — METABOLIC PANEL, BASIC
Anion gap: 13 mmol/L (ref 10–20)
BUN: 10 mg/dL (ref 7–18)
CO2: 25 mmol/L (ref 21–32)
Calcium: 8.7 mg/dL (ref 8.5–10.1)
Chloride: 104 mmol/L (ref 98–107)
Creatinine: 1 mg/dL (ref 0.6–1.3)
GFR est AA: >60 mL/min/{1.73_m2} (ref >60)
GFR est non-AA: >60 mL/min/{1.73_m2} (ref >60)
Glucose: 108 mg/dL — ABNORMAL HIGH (ref 74–106)
Potassium: 3.6 mmol/L (ref 3.5–5.1)
Sodium: 139 mmol/L (ref 136–145)

## 2011-10-28 LAB — PROTHROMBIN TIME + INR
INR: 1 (ref 0.8–1.2)
Prothrombin time: 9.8 s (ref 9.4–11.1)

## 2011-10-28 LAB — PTT: aPTT: 24.7 s (ref 22.0–28.0)

## 2011-10-28 LAB — LACTIC ACID
Lactic acid: 1.4 MMOL/L (ref 0.4–2.0)
Lactic acid: 1.1 MMOL/L (ref 0.4–2.0)

## 2011-10-28 LAB — HCG QL SERUM: HCG, Ql.: NEGATIVE

## 2011-10-28 NOTE — ED Notes (Signed)
Blood cultures x2 drawn and labelled.  Iv established.  IV Levaquin started as ordered.

## 2011-10-28 NOTE — ED Notes (Signed)
12:27 AM: Pt reports she has had multiple LP's in the past that have required interventional radiology due to the technical difficulty.  dw c pt plan to hold lp at this time and will elect to give abx. Pt understands and agrees with plan. All questions answered.

## 2011-10-28 NOTE — ED Notes (Signed)
Call to [facility],     TRANSFER - OUT REPORT:    Verbal report given to Erin, RN on Danielle Mcgrath  being transferred to [facility] for routine progression of care       Report consisted of patient???s Situation, Background, Assessment and   Recommendations(SBAR).     Information from the following report(s) SBAR was reviewed with the receiving nurse.    Opportunity for questions and clarification was provided.

## 2011-10-28 NOTE — ED Notes (Signed)
Report faxed to [facility].  Pt sleeping at this time. Iv antibiotics as ordered.

## 2011-10-28 NOTE — H&P (Unsigned)
[facility]  History and Physical                                            Name: Danielle Mcgrath, Danielle Mcgrath                                            MR#: [id number]                                            Account #: [id number]                                            Date of Adm: 10/28/2011        Facility [facility]  DocId [id number]  ChartScript-[id number]    CHIEF COMPLAINT: Intractable headache and also fever.    HISTORY OF PRESENT ILLNESS: This is a 31 year old female  with past medical history of asthma, viral meningitis, and migraine  who basically presented to the emergency, repeat ER visit, because  of intractable headache and also neck pain and low back pain.  Patient states that pain started in the middle head, radiating to the  lower back, feels weak, associated with some photophobia and also  nausea and vomiting. Patient states also feels blindness, not able to  see. Patient did not have fever yesterday, but now developed fever  and described the pain as dull and throbbing. Severity of  9/10. Pertinents include no weakness, no dizziness. She has not  been having any relief, so that is why she decided to come to  emergency room for evaluation. In the emergency room, patient  was found to have elevated temperature, but no abnormalities were  noted in her CBC and also comprehensive panel. CT scan also  done prior was also negative. So the decision was to do a lumbar  puncture, but because of the patient's weight and size, procedure  was aborted. Also, patient has had previous lumbar punctures in  the back. So decision was made to admit the patient for further  stabilization and treatment.    PAST MEDICAL HISTORY: As per HPI.    PAST SURGICAL HISTORY: Includes appendectomy,  cholecystectomy, and also cesarean section.    ALLERGIES: PATIENT HAS KNOWN ALLERGIES TO  PENICILLIN.    SOCIAL HISTORY: Patient is married. No smoking or drug  abuse.    PHYSICAL EXAMINATION  GENERAL:  Patient was found lying in bed, mild to moderate  distress secondary to neck pain and headache. Alert, oriented x3.  VITAL SIGNS: Blood pressure 143/69, pulse of 69, temperature  100.3, respirations 20.  HEAD AND NECK: PERRLA. EOMI. Anicteric sclerae.  Normocephalic, atraumatic. Oropharynx is clear and mildly dry.  No petechiae noted in oral cavity. ________ exhibits no discharge.  Positive photophobia. No papilledema. Neck was supple.  HEART: S1, S2, regular rhythm and rate.  CHEST: Clear.  ABDOMEN: Soft, nontender.  EXTREMITIES: Intact. No calf tenderness.  NEUROLOGIC: Patient is able to communicate. Normal  movement, muscle strength ________ upper and lower extremities.  Sensory function is intact.  SKIN: Normal. No mottling. No rash. No ecchymosis noted.    ANCILLARY TESTING: CBC: WBC 7.5, hemoglobin of 12,  hematocrit of 36.8, platelets of 356. Basic metabolic profile with a  sodium 139, potassium 3.6, chloride of 104, bicarb of 25, glucose  108, BUN of 10, creatinine 1. PT and INR 9.8 and 1, PTT of 24.4.  Lactic acid 1.4, and hCG is negative. CT scan of the head is  negative for bleed or mass, and UA is negative.    ASSESSMENT AND PLAN: This is a 31 year old female with  intractable headache, possibly rule out intractable migraine and  also fever not otherwise specified, possibly rule out viral  meningitis. Patient will be admitted to medical-surgical floor.  Condition is guarded. NPO. IV fluid. Protonix 40 mg IV daily.  Zofran 4 mg IV q.6 h. Vancomycin 1 g IV q.12 h. Levaquin 500  mg IV daily. Neurology consult and MRI of brain ________.  Tylenol 650 p.o. q.4 h. p.r.n. Dilaudid 2 mg IV q.6 h. p.r.n. pain.  Advair 250/50 one puff b.i.d. CBC, Chem-20 in the morning.  DuoNeb q.4 h. p.r.n. Benadryl 25 mg IV q.6 h. p.r.n. for itching.  Further recommendations as per patient's progress and also  consultants.        __________________________________  Hinton Dyer MD    CS / [place]  D:  10/28/2011   01:39  T:   10/28/2011   08:40  Job #:  [id number]             wm    CS Doc#:  [id number]                                    Page 1 of 3

## 2011-10-29 LAB — PT/PTT PANEL
INR: 0.9 (ref 0.8–1.2)
Prothrombin time: 9.6 s (ref 9.4–11.1)
aPTT: 25.2 s (ref 22.0–28.0)

## 2011-10-29 LAB — LYME IGG,IGM W RFLX CONFIRM: LYME, TOTAL IGG/IGM: NEGATIVE

## 2011-10-29 NOTE — Consults (Unsigned)
[facility]  CONSULTATION                                            Name:  Danielle Mcgrath, Danielle Mcgrath                                            [facility]:  [id number]                                            Account #:  [id number]                                            Date of Admission:  10/28/2011      Facility [facility]  DocId [id number]  ChartScript-[id number]    INFECTIOUS DISEASE CONSULTATION    DATE OF CONSULTATION:    REASON FOR CONSULTATION: Called to evaluate this 31-  year-old female with a past medical history of viral meningitis and  asthma for headache.    REFERRING PHYSICIAN: The patient was kindly referred by the  neurologist, Dr. Ladona Ridgel.    HISTORY OF PRESENT ILLNESS: The patient is a 31 year old  female with a past medical history as stated above who woke on  Tuesday having a what she described pounding headache. The  patient had gone to the emergency room here at [facility]  where she was given Percocet. The patient states that on  Wednesday, the headache still persisted, now accompanied with  fevers.  Headaches worsened as well as the fever. The patient  states that she began having neck stiffness as well as neck pain  which radiated down her back. At present, the patient is on  vancomycin, Levaquin, and acyclovir.    PAST MEDICAL HISTORY: As stated above in the HPI.    ALLERGIES: PENICILLIN - HAS HIVES.    MEDICATIONS  1.  Vancomycin.  2.  Levaquin.  3.  Acyclovir.    SOCIAL HISTORY: No smoking, no alcohol, no drug use.    PHYSICAL EXAMINATION  VITAL SIGNS: BP was 124/64 with a pulse of 77, respirations 20,  temperature 98.6.  HEENT: Normocephalic, atraumatic.  NECK:  Neck is stiff with tenderness to palpation.  CHEST AND LUNGS: Fair airway entry. Clear to auscultation  bilaterally.  CARDIOVASCULAR: Normal S1, S2.  ABDOMEN: Positive bowel sounds, soft, nontender,  nondistended, no guarding.  EXTREMITIES: No cyanosis, clubbing, or edema.  SKIN: No rashes noted.   BACK: Reveals point tenderness along the spine in the cervical,  thoracic and lumbar regions.    LABORATORY DATA:  Laboratory examination reveals sodium  138, potassium is 4, chloride 105, CO2 is 22, BUN is 10,  creatinine 0.9, glucose is 70. WBC is 6.3, hemoglobin 12.2,  hematocrit 37.1, platelets 355. Lyme antibodies were negative.    IMPRESSION:  A 31 year old female with a past medical history  of viral meningitis and asthma, now with a 3 day history of  headache/fever, neck pain/stiffness, back pain and confusion.    PLAN  1.  Continue vancomycin and acyclovir for now.  2.  Discontinue Levaquin, start meropenem.  3.  LP - follow up the results.  4.  Blood cultures x2 sets.  5.  Neuro followup.    Thank you for allowing me to participate in the care of  your patient.        __________________________________  Kela Millin MD    GA / MB  D:  10/29/2011   16:31  T:  10/29/2011   17:00  Job #:  [id number]              wm    CS Doc#:  [id number]                                  Page 1 of 2

## 2011-10-31 LAB — CULTURE, BLOOD: Culture result:: NO GROWTH

## 2011-11-01 LAB — RPR
RPR: NONREACTIVE
RPR: NONREACTIVE

## 2011-11-01 LAB — METABOLIC PANEL, COMPREHENSIVE
A-G Ratio: 0.9 (ref 0.7–2.8)
ALT (SGPT): 40 U/L (ref 12–78)
AST (SGOT): 21 U/L (ref 15–37)
Albumin: 3.5 g/dL (ref 3.5–4.7)
Alk. phosphatase: 92 U/L (ref 50–136)
Anion gap: 13 mmol/L (ref 10–20)
BUN: 11 mg/dL (ref 7–18)
Bilirubin, total: 0.3 mg/dL (ref 0.2–1.0)
CO2: 18 mmol/L — ABNORMAL LOW (ref 21–32)
Calcium: 8.5 mg/dL (ref 8.5–10.1)
Chloride: 114 mmol/L — ABNORMAL HIGH (ref 98–107)
Creatinine: 1 mg/dL (ref 0.6–1.3)
GFR est AA: >60 mL/min/{1.73_m2} (ref >60)
GFR est non-AA: >60 mL/min/{1.73_m2} (ref >60)
Globulin: 4.1 g/dL (ref 1.7–4.7)
Glucose: 112 mg/dL — ABNORMAL HIGH (ref 74–106)
Potassium: 3.8 mmol/L (ref 3.5–5.1)
Protein, total: 7.6 g/dL (ref 6.4–8.2)
Sodium: 141 mmol/L (ref 136–145)

## 2011-11-01 LAB — CBC W/O DIFF
HCT: 35.2 % — ABNORMAL LOW (ref 36.0–47.0)
HGB: 11.4 g/dL — ABNORMAL LOW (ref 12.0–16.0)
MCH: 28.7 PG (ref 27.0–35.0)
MCHC: 32.4 g/dL (ref 30.7–37.3)
MCV: 88.7 FL (ref 81.0–94.0)
MPV: 10 FL (ref 9.2–11.8)
PLATELET: 432 10*3/uL — ABNORMAL HIGH (ref 130–400)
RBC: 3.97 M/uL — ABNORMAL LOW (ref 4.20–5.40)
RDW: 14.2 % — ABNORMAL HIGH (ref 11.5–14.0)
WBC: 18 10*3/uL — ABNORMAL HIGH (ref 4.8–10.6)

## 2011-11-01 LAB — CULTURE, BLOOD: Culture result:: NO GROWTH

## 2011-11-02 ENCOUNTER — Telehealth: Payer: Self-pay | Admitting: Emergency Medicine

## 2011-11-02 LAB — PT/PTT PANEL
INR: 1 (ref 0.8–1.2)
Prothrombin time: 10 s (ref 9.4–11.1)
aPTT: 22 s (ref 22.0–28.0)

## 2011-11-02 LAB — CBC WITH AUTOMATED DIFF
HCT: 33.6 % — ABNORMAL LOW (ref 36.0–47.0)
HGB: 10.9 g/dL — ABNORMAL LOW (ref 12.0–16.0)
LYMPHOCYTES: 11 % — ABNORMAL LOW (ref 18–40)
MCH: 28.6 PG (ref 27.0–35.0)
MCHC: 32.4 g/dL (ref 30.7–37.3)
MCV: 88.2 FL (ref 81.0–94.0)
METAMYELOCYTES: 4 % — ABNORMAL HIGH
MONOCYTES: 6 % (ref 2–12)
MPV: 10 FL (ref 9.2–11.8)
NEUTROPHILS: 79 % — ABNORMAL HIGH (ref 48–72)
PLATELET ESTIMATE: ADEQUATE
PLATELET: 391 10*3/uL (ref 130–400)
RBC: 3.81 M/uL — ABNORMAL LOW (ref 4.20–5.40)
RDW: 14.5 % — ABNORMAL HIGH (ref 11.5–14.0)
WBC: 13.6 10*3/uL — ABNORMAL HIGH (ref 4.8–10.6)

## 2011-11-02 LAB — ANAPLASMA PHAGOCYTOPHILUM ABS
HGE IgG: NEGATIVE
HGE IgM: NEGATIVE

## 2011-11-02 LAB — METABOLIC PANEL, BASIC
Anion gap: 14 mmol/L (ref 10–20)
BUN: 13 mg/dL (ref 7–18)
CO2: 18 mmol/L — ABNORMAL LOW (ref 21–32)
Calcium: 8.2 mg/dL — ABNORMAL LOW (ref 8.5–10.1)
Chloride: 112 mmol/L — ABNORMAL HIGH (ref 98–107)
Creatinine: 0.9 mg/dL (ref 0.6–1.3)
GFR est AA: >60 mL/min/{1.73_m2} (ref >60)
GFR est non-AA: >60 mL/min/{1.73_m2} (ref >60)
Glucose: 112 mg/dL — ABNORMAL HIGH (ref 74–106)
Potassium: 3.6 mmol/L (ref 3.5–5.1)
Sodium: 140 mmol/L (ref 136–145)

## 2011-11-02 LAB — HSV 1/2 AB, IGG: HSV I/II Ab, IgG: 36.8 index — ABNORMAL HIGH (ref 0.0–0.8)

## 2011-11-02 NOTE — Telephone Encounter (Signed)
Patient reports vaginal odor for 2-3 days,  some discharge . States she has had these same symptoms before . Has changed soaps . Advised will need appointment for medication. Appointment scheduled tomorrow.

## 2011-11-02 NOTE — Telephone Encounter (Signed)
Has BV and has no transportation.  Wants to know if something can be called in.

## 2011-11-03 ENCOUNTER — Ambulatory Visit (INDEPENDENT_AMBULATORY_CARE_PROVIDER_SITE_OTHER): Payer: Medicaid Other | Admitting: Family Medicine

## 2011-11-03 VITALS — BP 114/77 | HR 69 | Temp 98.2°F | Ht 64.0 in | Wt 177.0 lb

## 2011-11-03 DIAGNOSIS — N760 Acute vaginitis: Secondary | ICD-10-CM

## 2011-11-03 LAB — CBC WITH AUTOMATED DIFF
ABS. BASOPHILS: 0 10*3/uL (ref 0.0–0.1)
ABS. EOSINOPHILS: 0 10*3/uL (ref 0.0–0.7)
ABS. LYMPHOCYTES: 1.8 10*3/uL (ref 1.5–3.5)
ABS. MONOCYTES: 1.3 10*3/uL — ABNORMAL HIGH (ref 0.0–1.0)
ABS. NEUTROPHILS: 11.4 10*3/uL — ABNORMAL HIGH (ref 1.5–6.6)
BASOPHILS: 0 % (ref 0.0–3.0)
EOSINOPHILS: 0 % (ref 0.0–7.0)
HCT: 33.7 % — ABNORMAL LOW (ref 36.0–47.0)
HGB: 11.1 g/dL — ABNORMAL LOW (ref 12.0–16.0)
IMMATURE GRANULOCYTES: 1.6 % (ref 0.0–2.0)
LYMPHOCYTES: 12 % — ABNORMAL LOW (ref 18.0–40.0)
MCH: 29.1 PG (ref 27.0–35.0)
MCHC: 32.9 g/dL (ref 30.7–37.3)
MCV: 88.2 FL (ref 81.0–94.0)
MONOCYTES: 9 % (ref 2.0–12.0)
MPV: 9.7 FL (ref 9.2–11.8)
NEUTROPHILS: 79 % — ABNORMAL HIGH (ref 48.0–72.0)
PLATELET: 391 10*3/uL (ref 130–400)
RBC: 3.82 M/uL — ABNORMAL LOW (ref 4.20–5.40)
RDW: 14.6 % — ABNORMAL HIGH (ref 11.5–14.0)
WBC: 14.8 10*3/uL — ABNORMAL HIGH (ref 4.8–10.6)

## 2011-11-03 LAB — CULTURE, BLOOD
Culture result:: NO GROWTH
Culture result:: NO GROWTH

## 2011-11-03 LAB — METABOLIC PANEL, BASIC
Anion gap: 12 mmol/L (ref 10–20)
BUN: 16 mg/dL (ref 7–18)
CO2: 19 mmol/L — ABNORMAL LOW (ref 21–32)
Calcium: 7.3 mg/dL — ABNORMAL LOW (ref 8.5–10.1)
Chloride: 113 mmol/L — ABNORMAL HIGH (ref 98–107)
Creatinine: 0.9 mg/dL (ref 0.6–1.3)
GFR est AA: >60 mL/min/{1.73_m2} (ref >60)
GFR est non-AA: >60 mL/min/{1.73_m2} (ref >60)
Glucose: 108 mg/dL — ABNORMAL HIGH (ref 74–106)
Potassium: 3.4 mmol/L — ABNORMAL LOW (ref 3.5–5.1)
Sodium: 140 mmol/L (ref 136–145)

## 2011-11-03 LAB — BABESIA MICROTI AB, IGG/IGM
Babesia microti IgG: <1:10 {titer}
Babesia microti IgM: <1:10 {titer}

## 2011-11-03 LAB — HSV 1/2 AB, IGM: HSV I/II Ab, IgM: <0.91 Ratio (ref 0.00–0.90)

## 2011-11-03 LAB — VZV AB, IGM: V. zoster Ab, IgM: <0.91 index (ref 0.00–0.90)

## 2011-11-03 LAB — POCT WET PREP (WET MOUNT): Clue Cells Wet Prep Whiff POC: NEGATIVE

## 2011-11-03 MED ORDER — FLUCONAZOLE 150 MG PO TABS: 150.0000 mg | ORAL_TABLET | Freq: Once | ORAL | Status: AC

## 2011-11-03 MED ORDER — METRONIDAZOLE 500 MG PO TABS: 500.0000 mg | ORAL_TABLET | Freq: Two times a day (BID) | ORAL | Status: AC

## 2011-11-03 NOTE — Progress Notes (Signed)
  Subjective:    Patient ID: Heidi [name], female    DOB: Sep 15, 1980, 31 y.o.   MRN: [id number]  HPI  Heidi [name] comes in for vaginal discharge, odor, and itching for 3 days.  She gets frequent BV.  She says she switched soaps and thinks that is what caused it.  She denies new sexual partners and does not have concerns about STD's.    Review of Systems See HPI    Objective:   Physical Exam BP 114/77  Pulse 69  Temp 98.2 F (36.8 C) (Oral)  Ht 5\' 4"  (1.626 m)  Wt 177 lb (80.287 kg)  BMI 30.38 kg/m2 General appearance: alert, cooperative and no distress Pelvic: cervix normal in appearance, external genitalia normal, no adnexal masses or tenderness, no cervical motion tenderness, rectovaginal septum normal, uterus normal size, shape, and consistency and vagina normal but thin grey discharge.        Assessment & Plan:

## 2011-11-03 NOTE — Patient Instructions (Signed)
It was nice to see you.  I will send you a letter with the results of your lab tests.  Please let us know if you are not feeling better by the time you finish the antibiotics.

## 2011-11-03 NOTE — Assessment & Plan Note (Signed)
Wet prep done, will treat for BV, and per patient request Rx for diflucan empirically as she often gets yeast infections after antibiotics.  Follow up if not improving.

## 2011-11-04 LAB — CBC WITH AUTOMATED DIFF
HCT: 33.7 % — ABNORMAL LOW (ref 36.0–47.0)
HGB: 11.1 g/dL — ABNORMAL LOW (ref 12.0–16.0)
LYMPHOCYTES: 20 % (ref 18–40)
MCH: 28.5 PG (ref 27.0–35.0)
MCHC: 32.9 g/dL (ref 30.7–37.3)
MCV: 86.4 FL (ref 81.0–94.0)
METAMYELOCYTES: 1 % — ABNORMAL HIGH
MONOCYTES: 5 % (ref 2–12)
MPV: 9.8 FL (ref 9.2–11.8)
MYELOCYTES: 2 % — ABNORMAL HIGH
NEUTROPHILS: 72 % (ref 48–72)
PLATELET ESTIMATE: ADEQUATE
PLATELET: 392 10*3/uL (ref 130–400)
RBC: 3.9 M/uL — ABNORMAL LOW (ref 4.20–5.40)
RDW: 14.6 % — ABNORMAL HIGH (ref 11.5–14.0)
WBC: 20.9 10*3/uL — ABNORMAL HIGH (ref 4.8–10.6)

## 2011-11-04 LAB — METABOLIC PANEL, BASIC
Anion gap: 16 mmol/L (ref 10–20)
BUN: 16 mg/dL (ref 7–18)
CO2: 17 mmol/L — ABNORMAL LOW (ref 21–32)
Calcium: 7.9 mg/dL — ABNORMAL LOW (ref 8.5–10.1)
Chloride: 111 mmol/L — ABNORMAL HIGH (ref 98–107)
Creatinine: 0.8 mg/dL (ref 0.6–1.3)
GFR est AA: >60 mL/min/{1.73_m2} (ref >60)
GFR est non-AA: >60 mL/min/{1.73_m2} (ref >60)
Glucose: 93 mg/dL (ref 74–106)
Potassium: 3.4 mmol/L — ABNORMAL LOW (ref 3.5–5.1)
Sodium: 140 mmol/L (ref 136–145)

## 2011-11-05 LAB — METABOLIC PANEL, BASIC
Anion gap: 13 mmol/L (ref 10–20)
BUN: 15 mg/dL (ref 7–18)
CO2: 20 mmol/L — ABNORMAL LOW (ref 21–32)
Calcium: 8.3 mg/dL — ABNORMAL LOW (ref 8.5–10.1)
Chloride: 109 mmol/L — ABNORMAL HIGH (ref 98–107)
Creatinine: 0.8 mg/dL (ref 0.6–1.3)
GFR est AA: >60 mL/min/{1.73_m2} (ref >60)
GFR est non-AA: >60 mL/min/{1.73_m2} (ref >60)
Glucose: 84 mg/dL (ref 74–106)
Potassium: 3.7 mmol/L (ref 3.5–5.1)
Sodium: 138 mmol/L (ref 136–145)

## 2011-11-05 LAB — CBC WITH AUTOMATED DIFF
ABS. BASOPHILS: 0.1 10*3/uL (ref 0.0–0.1)
ABS. EOSINOPHILS: 0 10*3/uL (ref 0.0–0.7)
ABS. LYMPHOCYTES: 3.6 10*3/uL — ABNORMAL HIGH (ref 1.5–3.5)
ABS. MONOCYTES: 2.3 10*3/uL — ABNORMAL HIGH (ref 0.0–1.0)
ABS. NEUTROPHILS: 16.7 10*3/uL — ABNORMAL HIGH (ref 1.5–6.6)
BASOPHILS: 0 % (ref 0.0–3.0)
EOSINOPHILS: 0 % (ref 0.0–7.0)
HCT: 36.7 % (ref 36.0–47.0)
HGB: 12.2 g/dL (ref 12.0–16.0)
IMMATURE GRANULOCYTES: 2.9 % — ABNORMAL HIGH (ref 0.0–2.0)
LYMPHOCYTES: 16 % — ABNORMAL LOW (ref 18.0–40.0)
MCH: 28.8 PG (ref 27.0–35.0)
MCHC: 33.2 g/dL (ref 30.7–37.3)
MCV: 86.8 FL (ref 81.0–94.0)
MONOCYTES: 10 % (ref 2.0–12.0)
MPV: 10.2 FL (ref 9.2–11.8)
NEUTROPHILS: 74 % — ABNORMAL HIGH (ref 48.0–72.0)
PLATELET: 417 10*3/uL — ABNORMAL HIGH (ref 130–400)
RBC: 4.23 M/uL (ref 4.20–5.40)
RDW: 14.8 % — ABNORMAL HIGH (ref 11.5–14.0)
WBC: 23.4 10*3/uL — ABNORMAL HIGH (ref 4.8–10.6)

## 2011-11-05 LAB — C. DIFFICILE AG + TOXIN A/B
C. difficile Ag: POSITIVE — AB
Toxin A/B: NEGATIVE

## 2011-11-05 NOTE — Discharge Summary (Signed)
[facility]  DISCHARGE SUMMARY                                            Name: Danielle Mcgrath, Danielle Mcgrath                                            MR#: [id number]                                            Account #: [id number]                                            Date of Adm: 10/28/2011      Facility [facility]  DocId [id number]  ChartScript-[id number]    DATE OF ADMISSION: 10/28/2011    DATE OF DISCHARGE:    HOSPITAL COURSE: This is a 31 year old female who was  admitted on 10/28/2011 with questionable meningitis. Admission  complaint was a headache. The patient started empirically on  antibacterial and antiviral agent, and attempted LP multiple times,  the patient refused, unable to tolerate the LP, and the decision was  made to empirically treat for 21 days.    The patient clinically stable. Offered to transfer to rehab for  continued antibiotics for 21 days. The patient originally from  [place], the patient wanted to go back to [place], and the patient  signed AMA and wanted to leave the hospital.    The patient was on Decadron as well as p.o. vancomycin, which  need to be continued. I gave a prescription for vancomycin 250 mg  p.o. q.6h. for the next 10 days for C difficile colitis, and prednisone  taper off dose prescription was given. The patient was also on  meropenem as well as acyclovir, which need to be continued;  however, the patient is signing against medical advice. The patient  alert, awake, oriented x3 and able to make the decision. Psych  consult was requested for competency, which will be obtained, and  the patient was leaving the hospital. Risks and benefits were  discussed with the patient. The patient understood the risks, but the  patient insisted that she wanted to go back to [place] and she will  be obtaining the rest of the treatment in [place]. Left a message  for the primary care physician in [place], have not gotten a call  back yet.         __________________________________  Zenaida Niece MD    JO / PR  D:  11/05/2011   12:20  T:  11/05/2011   21:48  Job #:  [id number]                wm    CS Doc#:  [id number]                                    Page 1 of 2

## 2011-11-09 ENCOUNTER — Ambulatory Visit: Payer: Medicaid Other | Admitting: Family Medicine

## 2011-11-09 ENCOUNTER — Ambulatory Visit: Payer: Medicaid Other | Admitting: Emergency Medicine

## 2011-11-10 ENCOUNTER — Ambulatory Visit (INDEPENDENT_AMBULATORY_CARE_PROVIDER_SITE_OTHER): Payer: Medicaid Other | Admitting: Family Medicine

## 2011-11-10 VITALS — BP 114/80 | HR 82 | Ht 64.0 in | Wt 179.0 lb

## 2011-11-10 DIAGNOSIS — R251 Tremor, unspecified: Secondary | ICD-10-CM

## 2011-11-10 DIAGNOSIS — F411 Generalized anxiety disorder: Secondary | ICD-10-CM

## 2011-11-10 DIAGNOSIS — R450 Nervousness: Secondary | ICD-10-CM

## 2011-11-10 DIAGNOSIS — R259 Unspecified abnormal involuntary movements: Secondary | ICD-10-CM

## 2011-11-10 LAB — TSH: TSH: 0.662 u[IU]/mL (ref 0.350–4.500)

## 2011-11-10 LAB — GLUCOSE, CAPILLARY: Glucose-Capillary: 84 mg/dL (ref 70–99)

## 2011-11-10 MED ORDER — CLONAZEPAM 0.5 MG PO TABS: 0.5000 mg | ORAL_TABLET | Freq: Two times a day (BID) | ORAL | Status: DC | PRN

## 2011-11-10 NOTE — Patient Instructions (Addendum)
Thank you for coming in today, it was good to see you Your sugar looks ok today. I have refilled your klonopin for two weeks, you should be seen by Dr. Elwyn Reach for further refills.

## 2011-11-11 ENCOUNTER — Telehealth: Payer: Self-pay | Admitting: Emergency Medicine

## 2011-11-11 NOTE — Telephone Encounter (Signed)
Called patient and informed of normal TSH result.[name], Rodena Medin

## 2011-11-11 NOTE — Telephone Encounter (Signed)
Pt is asking for results of labs yesterday

## 2011-11-14 NOTE — Assessment & Plan Note (Signed)
Refilled klonopin x2 weeks. Advised to f/u with new PCP for further refills.

## 2011-11-14 NOTE — Assessment & Plan Note (Signed)
Symptoms relatively vague, blood glucose within normal range today.  Given feeling of palpitations will also check TSH.  This could all however be related to her anxiety.

## 2011-11-14 NOTE — Progress Notes (Signed)
  Subjective:    Patient ID: Heidi [name], female    DOB: March 07, 1981, 31 y.o.   MRN: [id number]  HPI  1. Low blood glucose?:  Reports feelings of "shakiness" for the past couple of months.  Thinks this may be low blood sugar. This seems to occur when she has not eaten or goes a long period of time without a meal.  She stays she feels that she has to eat all the time to keep from getting this feeling.  She has purposely not eaten this morning and is starting to have this feeling today.  She does has associated palpitations and mild sob when this occurs as well.  She denies nausea or vomiting, headache, chest pain. .  2.  Anxiety:  History of bipolar d/o and anxiety.  Has been using clonazepam for anxiety symptoms, however has been out of this for the past couple of days.  Clonazepam typically controls her symptoms of anxiety.  She does not think her symptoms of above are related to her anxiety.    Review of Systems Per HPI    Objective:   Physical Exam  Constitutional: No distress.  Neck: Neck supple. No thyromegaly present.  Cardiovascular: Normal rate and regular rhythm.   Pulmonary/Chest: Effort normal and breath sounds normal.  Musculoskeletal: She exhibits no edema.  Neurological: She is alert.          Assessment & Plan:

## 2011-11-15 ENCOUNTER — Ambulatory Visit: Payer: Medicaid Other | Admitting: Emergency Medicine

## 2011-11-23 NOTE — ED Provider Notes (Signed)
I was personally available for consultation in the emergency department.  I have reviewed the chart and agree with the documentation recorded by the MLP, including the assessment, treatment plan, and disposition.  Skylarr Liz A Lemoyne Scarpati, MD

## 2011-11-30 ENCOUNTER — Ambulatory Visit: Payer: Medicaid Other | Admitting: Emergency Medicine

## 2011-12-06 ENCOUNTER — Encounter: Payer: Self-pay | Admitting: Emergency Medicine

## 2011-12-06 ENCOUNTER — Ambulatory Visit (INDEPENDENT_AMBULATORY_CARE_PROVIDER_SITE_OTHER): Payer: Medicaid Other | Admitting: Emergency Medicine

## 2011-12-06 VITALS — BP 115/80 | HR 78 | Ht 64.0 in | Wt 180.0 lb

## 2011-12-06 DIAGNOSIS — N760 Acute vaginitis: Secondary | ICD-10-CM

## 2011-12-06 DIAGNOSIS — R251 Tremor, unspecified: Secondary | ICD-10-CM

## 2011-12-06 DIAGNOSIS — R450 Nervousness: Secondary | ICD-10-CM

## 2011-12-06 DIAGNOSIS — A499 Bacterial infection, unspecified: Secondary | ICD-10-CM

## 2011-12-06 DIAGNOSIS — R259 Unspecified abnormal involuntary movements: Secondary | ICD-10-CM

## 2011-12-06 DIAGNOSIS — N898 Other specified noninflammatory disorders of vagina: Secondary | ICD-10-CM

## 2011-12-06 DIAGNOSIS — F411 Generalized anxiety disorder: Secondary | ICD-10-CM

## 2011-12-06 LAB — POCT WET PREP (WET MOUNT)

## 2011-12-06 MED ORDER — NYSTATIN 100000 UNIT/ML MT SUSP: 500000.0000 [IU] | Freq: Two times a day (BID) | OROMUCOSAL | Status: DC

## 2011-12-06 MED ORDER — FLUCONAZOLE 150 MG PO TABS: 150.0000 mg | ORAL_TABLET | Freq: Once | ORAL | Status: DC

## 2011-12-06 MED ORDER — CLONAZEPAM 0.5 MG PO TABS: 0.5000 mg | ORAL_TABLET | Freq: Two times a day (BID) | ORAL | Status: DC | PRN

## 2011-12-06 MED ORDER — METRONIDAZOLE 500 MG PO TABS: 500.0000 mg | ORAL_TABLET | Freq: Two times a day (BID) | ORAL | Status: DC

## 2011-12-06 NOTE — Progress Notes (Signed)
  Subjective:    Patient ID: Heidi [name], female    DOB: 10/18/1980, 31 y.o.   MRN: [id number]  HPI Heidi [name] is here for med refill and vaginal discharge.  1. Med refill: Needs refill of her klonopin.  Takes this for anxiety and bipolar as well as seroquel.  Takes 1/2 to 1 tablet once or twice a day.  Also only takes seroquel on a prn basis.  Has been on benzos for about 1 year now.  Lots of current stressors at home and work.  2. Vaginal discharge: Reports being treated for BV about 10 days ago.  Now with thick white discharge.  + vaginal itching.  Reports that she gets BV frequently and oftens gets a yeast infection after treatment.  Also reports getting yeast of her tongue as she lets the flagyl dissolve on her tongue.  Uses dove soap, no douching.  Concerned she may be allergic to latex.  I have reviewed and updated the following as appropriate: allergies and current medications SHx: former smoker  Review of Systems See HPI    Objective:   Physical Exam BP 115/80  Pulse 78  Ht 5\' 4"  (1.626 m)  Wt 180 lb (81.647 kg)  BMI 30.90 kg/m2  LMP 11/22/2011 Gen: alert, cooperative, NAD, intermittently tearful when discussing stressors HEENT: At/[place], sclera white, MMM CV: RRR, no murmurs Pulm: CTAB Ext: no edema Pelvic: external genitalia normal, vaginal normal, thick white discharge present, cervix normal     Assessment & Plan:

## 2011-12-06 NOTE — Assessment & Plan Note (Signed)
Wet prep showed BV.  Will treat with flagyl 500mg  BID x7 days.  Will also provide diflucan 150mg  x1 and nystatin solution to prevent yeast infection. Discussed with patient using warm water only for washing and using cotton underwear as these are the least occlusive.  Called patient with results of wet prep.

## 2011-12-06 NOTE — Patient Instructions (Signed)
It was nice to meet you!  It sounds like you have a lot going on in your life right now.  I have provided a prescription for your Klonopin.  I will give you a call this afternoon with the results of your wet prep.  I would like to see you once a month for your anxiety, or more often as needed.

## 2011-12-06 NOTE — Assessment & Plan Note (Signed)
Multiple current stressors.  Will refill klonopin 0.5mg  #45.  Discussed follow up with me at least every 1-2 months.

## 2011-12-17 ENCOUNTER — Encounter (HOSPITAL_BASED_OUTPATIENT_CLINIC_OR_DEPARTMENT_OTHER): Payer: Self-pay | Admitting: *Deleted

## 2011-12-17 ENCOUNTER — Emergency Department (HOSPITAL_BASED_OUTPATIENT_CLINIC_OR_DEPARTMENT_OTHER)
Admission: EM | Admit: 2011-12-17 | Discharge: 2011-12-17 | Disposition: A | Discharge status: Home / Self Care | Payer: Medicaid Other | Attending: Emergency Medicine | Admitting: Emergency Medicine

## 2011-12-17 DIAGNOSIS — F411 Generalized anxiety disorder: Secondary | ICD-10-CM | POA: Insufficient documentation

## 2011-12-17 DIAGNOSIS — F3289 Other specified depressive episodes: Secondary | ICD-10-CM | POA: Insufficient documentation

## 2011-12-17 DIAGNOSIS — R070 Pain in throat: Secondary | ICD-10-CM | POA: Insufficient documentation

## 2011-12-17 DIAGNOSIS — Z87891 Personal history of nicotine dependence: Secondary | ICD-10-CM | POA: Insufficient documentation

## 2011-12-17 DIAGNOSIS — N390 Urinary tract infection, site not specified: Secondary | ICD-10-CM

## 2011-12-17 DIAGNOSIS — R300 Dysuria: Secondary | ICD-10-CM | POA: Insufficient documentation

## 2011-12-17 DIAGNOSIS — F329 Major depressive disorder, single episode, unspecified: Secondary | ICD-10-CM | POA: Insufficient documentation

## 2011-12-17 LAB — URINALYSIS, ROUTINE W REFLEX MICROSCOPIC
Glucose, UA: NEGATIVE mg/dL
Ketones, ur: NEGATIVE mg/dL
Nitrite: NEGATIVE
Protein, ur: NEGATIVE mg/dL

## 2011-12-17 LAB — URINE MICROSCOPIC-ADD ON

## 2011-12-17 MED ORDER — FLUCONAZOLE 150 MG PO TABS: 150.0000 mg | ORAL_TABLET | Freq: Once | ORAL | Status: DC

## 2011-12-17 MED ORDER — CIPROFLOXACIN HCL 500 MG PO TABS: 500.0000 mg | ORAL_TABLET | Freq: Two times a day (BID) | ORAL | Status: DC
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
## 2011-12-17 NOTE — ED Notes (Signed)
States she is having dysuria. Hx of UTI's and this feels the same. 2nd complaint pain in her left forearm from a bruise and knot after a fall 2 weeks ago.

## 2011-12-29 ENCOUNTER — Ambulatory Visit (INDEPENDENT_AMBULATORY_CARE_PROVIDER_SITE_OTHER): Payer: Medicaid Other | Admitting: Family Medicine

## 2011-12-29 ENCOUNTER — Encounter: Payer: Self-pay | Admitting: Family Medicine

## 2011-12-29 VITALS — BP 124/78 | HR 72 | Temp 98.2°F | Ht 64.0 in | Wt 183.0 lb

## 2011-12-29 DIAGNOSIS — B9789 Other viral agents as the cause of diseases classified elsewhere: Secondary | ICD-10-CM | POA: Insufficient documentation

## 2011-12-29 DIAGNOSIS — J028 Acute pharyngitis due to other specified organisms: Secondary | ICD-10-CM

## 2011-12-29 DIAGNOSIS — J029 Acute pharyngitis, unspecified: Secondary | ICD-10-CM

## 2011-12-29 LAB — POCT RAPID STREP A (OFFICE): Rapid Strep A Screen: NEGATIVE

## 2011-12-29 NOTE — Patient Instructions (Addendum)
Use the Afrin twice daily for the next 3 days and then STOP.  Use the Flonase twice daily after you use the Afrin to make things better. Keep using the Flonase even after you start getting better.

## 2011-12-29 NOTE — Assessment & Plan Note (Addendum)
Likely secondary to post-nasal drip. Allergic versus viral.  I told her we could send the swab for culture -- however after talking with lab we canNOT do this.  Patient left before I spoke with Kendal Hymen in lab, therefore we did not culture her throat. Afrin for relief of nasal congestion. Flonase for improvement as well.   FU next week if no improvement.

## 2011-12-31 ENCOUNTER — Ambulatory Visit: Payer: Medicaid Other | Admitting: Emergency Medicine

## 2011-12-31 NOTE — Progress Notes (Signed)
  Subjective:    Patient ID: Heidi [name], female    DOB: 1980-05-13, 31 y.o.   MRN: [id number]  HPI  Sore throat:  Present x 1 day.  Has had increasing allergic rhinitis symptoms for past 1-2 weeks.  No fevers or chills.  No cough.  Wants to ensure that she does not have strep or "something bad" with her throat.    Review of Systems See HPI above for review of systems.       Objective:   Physical Exam Gen:  Alert, cooperative patient who appears stated age in no acute distress.  Vital signs reviewed. Head:  [place]/AT Eyes:  EOMI, PERRL.  Sclera and conjunctiva non-erythematous and non-icteric. Nose:  Nares patent, grossly edematous and boggy nasal turbinates BL Ears:  Canals clear BL.  TM's pearly gray BL without effusion or retraction Mouth:  MMM.  Tonsils +2 BL without erythema or edema noted Neck:  No lymphadenopathy noted Cardiac:  Regular rate and rhythm without murmur auscultated.  Good S1/S2. Pulm:  Clear to auscultation bilaterally with good air movement.  No wheezes or rales noted.           Assessment & Plan:

## 2012-01-04 ENCOUNTER — Other Ambulatory Visit: Payer: Self-pay | Admitting: Family Medicine

## 2012-01-04 NOTE — Telephone Encounter (Signed)
Patient is completely out of her Protonix and would appreciate the refill happening today.  She would also like a call when this is done.

## 2012-01-14 ENCOUNTER — Telehealth: Payer: Self-pay | Admitting: *Deleted

## 2012-01-14 ENCOUNTER — Telehealth: Payer: Self-pay | Admitting: Emergency Medicine

## 2012-01-14 ENCOUNTER — Ambulatory Visit (INDEPENDENT_AMBULATORY_CARE_PROVIDER_SITE_OTHER): Payer: Medicaid Other | Admitting: *Deleted

## 2012-01-14 DIAGNOSIS — B9689 Other specified bacterial agents as the cause of diseases classified elsewhere: Secondary | ICD-10-CM

## 2012-01-14 DIAGNOSIS — Z23 Encounter for immunization: Secondary | ICD-10-CM

## 2012-01-14 DIAGNOSIS — N760 Acute vaginitis: Secondary | ICD-10-CM

## 2012-01-14 MED ORDER — FLUCONAZOLE 150 MG PO TABS: 150.0000 mg | ORAL_TABLET | Freq: Once | ORAL | Status: DC

## 2012-01-14 NOTE — Telephone Encounter (Signed)
Will fwd. To PCP for note. Lorenda Hatchet, [name]

## 2012-01-14 NOTE — Telephone Encounter (Signed)
Patient in office today fro flu vaccine and asks for message to be sent to Dr. Elwyn Reach that she  Needs another dose of Diflucan. Has recently had a course of metronidazole. Took one dose of Diflucan but states Dr. Elwyn Reach had told her if that didn't take care of the vaginal itching to let her know.  Will forward to MD.    [facility] , [address]

## 2012-01-14 NOTE — Telephone Encounter (Signed)
Repeat dose of Diflucan sent to pharmacy.  If continued symptoms after this dose, will need an appt.

## 2012-01-14 NOTE — Telephone Encounter (Signed)
Patient needs note stating that she is hypoglycemic and that she needs to be able to have food and/or drink at her desk at work.

## 2012-01-14 NOTE — Telephone Encounter (Signed)
Patient notified

## 2012-01-18 NOTE — Telephone Encounter (Signed)
Called pt and informed. .Heidi [name]  

## 2012-01-18 NOTE — Telephone Encounter (Signed)
Letter printed and placed up front for patient pick up.

## 2012-01-19 ENCOUNTER — Emergency Department (HOSPITAL_COMMUNITY)
Admission: EM | Admit: 2012-01-19 | Discharge: 2012-01-19 | Disposition: A | Discharge status: Home / Self Care | Payer: No Typology Code available for payment source | Attending: Emergency Medicine | Admitting: Emergency Medicine

## 2012-01-19 ENCOUNTER — Encounter (HOSPITAL_COMMUNITY): Payer: Self-pay | Admitting: Emergency Medicine

## 2012-01-19 DIAGNOSIS — S139XXA Sprain of joints and ligaments of unspecified parts of neck, initial encounter: Secondary | ICD-10-CM | POA: Insufficient documentation

## 2012-01-19 DIAGNOSIS — Z8742 Personal history of other diseases of the female genital tract: Secondary | ICD-10-CM | POA: Insufficient documentation

## 2012-01-19 DIAGNOSIS — M549 Dorsalgia, unspecified: Secondary | ICD-10-CM | POA: Insufficient documentation

## 2012-01-19 DIAGNOSIS — F411 Generalized anxiety disorder: Secondary | ICD-10-CM | POA: Insufficient documentation

## 2012-01-19 DIAGNOSIS — Y939 Activity, unspecified: Secondary | ICD-10-CM | POA: Insufficient documentation

## 2012-01-19 DIAGNOSIS — Z79899 Other long term (current) drug therapy: Secondary | ICD-10-CM | POA: Insufficient documentation

## 2012-01-19 DIAGNOSIS — S161XXA Strain of muscle, fascia and tendon at neck level, initial encounter: Secondary | ICD-10-CM

## 2012-01-19 DIAGNOSIS — Z87891 Personal history of nicotine dependence: Secondary | ICD-10-CM | POA: Insufficient documentation

## 2012-01-19 DIAGNOSIS — Y9289 Other specified places as the place of occurrence of the external cause: Secondary | ICD-10-CM | POA: Insufficient documentation

## 2012-01-19 DIAGNOSIS — F3289 Other specified depressive episodes: Secondary | ICD-10-CM | POA: Insufficient documentation

## 2012-01-19 DIAGNOSIS — IMO0002 Reserved for concepts with insufficient information to code with codable children: Secondary | ICD-10-CM | POA: Insufficient documentation

## 2012-01-19 DIAGNOSIS — S335XXA Sprain of ligaments of lumbar spine, initial encounter: Secondary | ICD-10-CM | POA: Insufficient documentation

## 2012-01-19 DIAGNOSIS — F329 Major depressive disorder, single episode, unspecified: Secondary | ICD-10-CM | POA: Insufficient documentation

## 2012-01-19 MED ORDER — CYCLOBENZAPRINE HCL 10 MG PO TABS: 10.0000 mg | ORAL_TABLET | Freq: Two times a day (BID) | ORAL | Status: DC | PRN

## 2012-01-19 MED ORDER — IBUPROFEN 800 MG PO TABS: 800.0000 mg | ORAL_TABLET | Freq: Three times a day (TID) | ORAL | Status: DC

## 2012-01-19 MED ORDER — OXYCODONE-ACETAMINOPHEN 5-325 MG PO TABS: 1.0000 | ORAL_TABLET | Freq: Four times a day (QID) | ORAL | Status: DC | PRN

## 2012-01-19 MED ORDER — IBUPROFEN 400 MG PO TABS
800.0000 mg | ORAL_TABLET | Freq: Once | ORAL | Status: AC
  Administered 2012-01-19: 800 mg via ORAL
  Filled 2012-01-19: qty 2

## 2012-01-19 MED ORDER — OXYCODONE-ACETAMINOPHEN 5-325 MG PO TABS
1.0000 | ORAL_TABLET | Freq: Once | ORAL | Status: AC
  Administered 2012-01-19: 1 via ORAL
  Filled 2012-01-19: qty 1
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
## 2012-01-19 NOTE — ED Notes (Signed)
Med given 

## 2012-01-20 ENCOUNTER — Ambulatory Visit (INDEPENDENT_AMBULATORY_CARE_PROVIDER_SITE_OTHER): Payer: Medicaid Other | Admitting: Sports Medicine

## 2012-01-20 ENCOUNTER — Encounter: Payer: Self-pay | Admitting: Sports Medicine

## 2012-01-20 VITALS — BP 122/84 | HR 81 | Temp 99.0°F | Wt 179.3 lb

## 2012-01-20 DIAGNOSIS — J309 Allergic rhinitis, unspecified: Secondary | ICD-10-CM

## 2012-01-20 DIAGNOSIS — J029 Acute pharyngitis, unspecified: Secondary | ICD-10-CM

## 2012-01-20 DIAGNOSIS — B9789 Other viral agents as the cause of diseases classified elsewhere: Secondary | ICD-10-CM

## 2012-01-20 LAB — POCT RAPID STREP A (OFFICE): Rapid Strep A Screen: NEGATIVE

## 2012-01-20 NOTE — Assessment & Plan Note (Signed)
Continues to use Zyrtec and flonase.  No change

## 2012-01-20 NOTE — Progress Notes (Signed)
  [facility]  Patient name: Heidi [name] MRN [id number]  Date of birth: [birth year]  CC & HPI:  Heidi [name] is a 31 y.o. female presenting today for evaluation of sore throat:  Onset  1 day ago  Character  cough, congestion, sore throat  Severity  8/10, can't eat   Temporal  no changes  Alleviating  trying tylenol, Excedrin, mucinex  Aggrivating  eating    ROS:  + subjective fevers, + chills.  No nausea, vomiting.  Lots of mucous in your throat.   Pertinent History Reviewed:  Medical & Surgical Hx:  Reviewed: Significant for hx of strep Medications: Reviewed & Updated - see associated section Social History: Reviewed -  reports that she quit smoking about 2 years ago. She has never used smokeless tobacco.  Objective Findings:  Vitals:  Filed Vitals:   01/20/12 1525  BP: 122/84  Pulse: 81  Temp: 99 F (37.2 C)    PE: GENERAL:  Adult AA female. In no discomfort; no respiratory distress. EENT:  Posterior pharynx is erythematous with streaking.  No exudate, Tonsils 1+/4 without exudate.  MMM H&N:  Trachea midline, anterior tender cervical lymphadenopathy Lungs: CTA B, no wheezes, no crackles Heart: RRR, S1/S2 heard, no murmur     Assessment & Plan:

## 2012-01-20 NOTE — Addendum Note (Signed)
Addended by: Gaspar Bidding D on: 01/20/2012 04:10 PM   Modules accepted: Orders

## 2012-01-20 NOTE — Assessment & Plan Note (Signed)
Suspect same as earlier in month with new inoculation from daughter.. Will collect culture. Treat symptomatically per AVS

## 2012-01-20 NOTE — Patient Instructions (Addendum)
You have a cold.  You can use tylenol, Afrin, and Excedrin as needed.  Watch for too much tylenol.  You can try Sudafed,   If you have any side effects stop it.  We will send for a culture.  We will call if it is positive.  Follow up with Dr. Elwyn Reach as previously requested  Antibiotic Nonuse  Your caregiver felt that the infection or problem was not one that would be helped with an antibiotic. Infections may be caused by viruses or bacteria. Only a caregiver can tell which one of these is the likely cause of an illness. A cold is the most common cause of infection in both adults and children. A cold is a virus. Antibiotic treatment will have no effect on a viral infection. Viruses can lead to many lost days of work caring for sick children and many missed days of school. Children may catch as many as 10 "colds" or "flus" per year during which they can be tearful, cranky, and uncomfortable. The goal of treating a virus is aimed at keeping the ill person comfortable. Antibiotics are medications used to help the body fight bacterial infections. There are relatively few types of bacteria that cause infections but there are hundreds of viruses. While both viruses and bacteria cause infection they are very different types of germs. A viral infection will typically go away by itself within 7 to 10 days. Bacterial infections may spread or get worse without antibiotic treatment. Examples of bacterial infections are:  Sore throats (like strep throat or tonsillitis).  Infection in the lung (pneumonia).  Ear and skin infections. Examples of viral infections are:  Colds or flus.  Most coughs and bronchitis.  Sore throats not caused by Strep.  Runny noses. It is often best not to take an antibiotic when a viral infection is the cause of the problem. Antibiotics can kill off the helpful bacteria that we have inside our body and allow harmful bacteria to start growing. Antibiotics can cause side effects  such as allergies, nausea, and diarrhea without helping to improve the symptoms of the viral infection. Additionally, repeated uses of antibiotics can cause bacteria inside of our body to become resistant. That resistance can be passed onto harmful bacterial. The next time you have an infection it may be harder to treat if antibiotics are used when they are not needed. Not treating with antibiotics allows our own immune system to develop and take care of infections more efficiently. Also, antibiotics will work better for [place] when they are prescribed for bacterial infections. Treatments for a child that is ill may include:  Give extra fluids throughout the day to stay hydrated.  Get plenty of rest.  Only give your child over-the-counter or prescription medicines for pain, discomfort, or fever as directed by your caregiver.  The use of a cool mist humidifier may help stuffy noses.  Cold medications if suggested by your caregiver. Your caregiver may decide to start you on an antibiotic if:  The problem you were seen for today continues for a longer length of time than expected.  You develop a secondary bacterial infection. SEEK MEDICAL CARE IF:  Fever lasts longer than 5 days.  Symptoms continue to get worse after 5 to 7 days or become severe.  Difficulty in breathing develops.  Signs of dehydration develop (poor drinking, rare urinating, dark colored urine).  Changes in behavior or worsening tiredness (listlessness or lethargy). Document Released: 05/17/2001 Document Revised: 05/31/2011 Document Reviewed: 11/13/2008 ExitCare Patient  Information 2013 [place], [place].

## 2012-01-21 ENCOUNTER — Telehealth: Payer: Self-pay | Admitting: Emergency Medicine

## 2012-01-21 NOTE — Telephone Encounter (Signed)
Spoke with Dr. Berline Chough and he advises throat culture was negative and patient does not require antibiotic. Advised continued symptomatic treatment. Patient notified.

## 2012-01-21 NOTE — Telephone Encounter (Signed)
Spoke with patient and she has laryngitis today and throat soreness is worse, hurts to talk. No fever.  Will send message to Dr. Berline Chough.   Text paged Dr. Berline Chough.

## 2012-01-21 NOTE — Telephone Encounter (Signed)
Patient was in yesterday for a sore throat, she is feeling much worse and is very upset.  She would really like to speak to the nurse about getting an abx or something.

## 2012-01-25 ENCOUNTER — Telehealth: Payer: Self-pay | Admitting: Family Medicine

## 2012-01-25 NOTE — Telephone Encounter (Signed)
Patient called emergency after hours line.  She is getting over a viral illness (? Flu).  She says she has been coughing a lot and having body aches.  However, she says she is having severe pain behind/in her right eye.  She denies any swelling around the eye, any vision changes or other vision changes, and denies any pain with eye movements.    I advised this may be a headache related to her viral illness, reviewed red flags for infection (swelling, vision change, pain with EOM).  Advised to call office at 8:30 for same-day appointment tomorrow morning.  Advised to alternate tylenol and ibuprofen, and try cold compresses on the eye.  Patient voices understanding.

## 2012-01-26 ENCOUNTER — Encounter: Payer: Self-pay | Admitting: Family Medicine

## 2012-01-26 ENCOUNTER — Ambulatory Visit (INDEPENDENT_AMBULATORY_CARE_PROVIDER_SITE_OTHER): Payer: Medicaid Other | Admitting: Family Medicine

## 2012-01-26 VITALS — BP 123/82 | HR 84 | Temp 98.3°F | Ht 64.0 in | Wt 178.8 lb

## 2012-01-26 DIAGNOSIS — R058 Other specified cough: Secondary | ICD-10-CM

## 2012-01-26 DIAGNOSIS — K219 Gastro-esophageal reflux disease without esophagitis: Secondary | ICD-10-CM

## 2012-01-26 DIAGNOSIS — R05 Cough: Secondary | ICD-10-CM

## 2012-01-26 MED ORDER — GUAIFENESIN-CODEINE 100-10 MG/5ML PO SYRP: 5.0000 mL | ORAL_SOLUTION | Freq: Three times a day (TID) | ORAL | Status: DC | PRN

## 2012-01-26 MED ORDER — OMEPRAZOLE 40 MG PO CPDR: 40.0000 mg | DELAYED_RELEASE_CAPSULE | Freq: Every day | ORAL | Status: DC

## 2012-01-26 NOTE — Patient Instructions (Addendum)
I agree that it is a virus. Your ears look good, no infection Your lungs sound good, no pneumonia.  The dry cough may last for weeks.  Not much you can do about it.

## 2012-01-31 DIAGNOSIS — R058 Other specified cough: Secondary | ICD-10-CM | POA: Insufficient documentation

## 2012-01-31 DIAGNOSIS — R05 Cough: Secondary | ICD-10-CM | POA: Insufficient documentation

## 2012-01-31 NOTE — Assessment & Plan Note (Signed)
Symptomatic treatment.  Test further only if fails to resolve.

## 2012-01-31 NOTE — Progress Notes (Signed)
  Subjective:    Patient ID: Heidi [name], female    DOB: [birth year], 31 y.o.   MRN: [id number]  HPI Cough x 6 days.  Denies SOB.  Started as a head cold.  All symptoms have resolved except for the cough.    Review of Systems     Objective:   Physical Exam Lungs clear. Neck no adenopathy HEENT normal        Assessment & Plan:

## 2012-02-07 ENCOUNTER — Other Ambulatory Visit: Payer: Self-pay | Admitting: *Deleted

## 2012-02-08 ENCOUNTER — Telehealth: Payer: Self-pay | Admitting: Family Medicine

## 2012-02-08 ENCOUNTER — Other Ambulatory Visit: Payer: Self-pay | Admitting: Family Medicine

## 2012-02-08 MED ORDER — CLONAZEPAM 0.5 MG PO TABS: 0.5000 mg | ORAL_TABLET | Freq: Two times a day (BID) | ORAL | Status: DC | PRN

## 2012-02-08 NOTE — Telephone Encounter (Signed)
Patient's medication refill was denied because she did not schedule a follow up. She ran out. However now she has a follow up appointment for Monday.  I will refill enough to get her to [facility] appointment.  Nursing will call the patient and let her know about the medication refill, and to follow up on Monday.

## 2012-02-08 NOTE — Telephone Encounter (Signed)
Spoke with Dr. Denyse Amass and he refilled to last until appointment. Rx called to pharmacy . Patient notified.

## 2012-02-08 NOTE — Telephone Encounter (Signed)
Received refill request for Clonazepam. Per appointment over 2 months ago PCP Dr. Elwyn Reach stated follow up in 1-2 months. Left message for patient to make appointment with Dr. Elwyn Reach for refill of medication (did not specify medication in voicemail)

## 2012-02-08 NOTE — Telephone Encounter (Signed)
Will forward message to Dr. Jennette Kettle, preceptor,  to ask if OK to give enough to last until  appointment 11/25.

## 2012-02-08 NOTE — Telephone Encounter (Signed)
Pt made appt to [facility] on Mon 11/25 - needs enough Clonazepam to last until then [facility] [address]

## 2012-02-08 NOTE — Telephone Encounter (Signed)
Refused as patient needs appointment ?

## 2012-02-14 ENCOUNTER — Ambulatory Visit (INDEPENDENT_AMBULATORY_CARE_PROVIDER_SITE_OTHER): Payer: Medicaid Other | Admitting: Emergency Medicine

## 2012-02-14 ENCOUNTER — Encounter: Payer: Self-pay | Admitting: Emergency Medicine

## 2012-02-14 VITALS — BP 129/80 | HR 69 | Ht 64.0 in | Wt 178.0 lb

## 2012-02-14 DIAGNOSIS — F411 Generalized anxiety disorder: Secondary | ICD-10-CM

## 2012-02-14 MED ORDER — BUSPIRONE HCL 10 MG PO TABS: 10.0000 mg | ORAL_TABLET | Freq: Two times a day (BID) | ORAL | Status: DC

## 2012-02-14 MED ORDER — CLONAZEPAM 1 MG PO TABS: 1.0000 mg | ORAL_TABLET | Freq: Two times a day (BID) | ORAL | Status: DC | PRN

## 2012-02-14 NOTE — Patient Instructions (Addendum)
It was good to see you; I'm sorry you're having such a tough time.  I increased your klonopin to 1mg  twice a day as needed. We also are going to start Buspar.  Take 1 tablet (10mg ) twice a day.  I am going to have our Social Working, Nelva Bush, give you a call in the next week or so. Some resources for counselors are... 1. [facility] [place] [phone] 2. [facility] [phone] 3. [email or web address].[facility].com  Follow up in 2-4 weeks.

## 2012-02-14 NOTE — Assessment & Plan Note (Addendum)
Multiple life stressors over the last 1-2 months.  Amendable to social work and counseling.  Will message Nelva Bush to give her a call.  Also provided information on several counselors in the area.  Will start Buspar 10mg  BID.  Also increased klonopin to 1mg  BID prn.  Hopefully, will be able to decrease this as the buspar takes effect and she starts counseling.  Follow up in 2-4 weeks.

## 2012-02-14 NOTE — Progress Notes (Signed)
  Subjective:    Patient ID: Heidi [name], female    DOB: 1980/10/13, 31 y.o.   MRN: [id number]  HPI NALEYAH OHLINGER is here for med refill.  She needs a refill of her klonopin which she takes for anxiety.  She reports that her anxiety is worse currently as things have not gone well since she was last here.  She recently lost her job after a prolonged viral illness.  She is also in the middle of a mediation for joint custody of one of her children and is worried how this will look.  Also having some issues with her current partner.  Currently taking klonopin 0.5mg  BID which minimal improvement in anxiety.  Denies depression symptoms and SI/HI.   SHx: former smoker  Review of Systems See HPI    Objective:   Physical Exam BP 129/80  Pulse 69  Ht 5\' 4"  (1.626 m)  Wt 178 lb (80.74 kg)  BMI 30.55 kg/m2  LMP 01/25/2012 Gen: alert, cooperative, tearful HEENT: AT/[place], sclera white, MMM CV: RRR, no murmurs Pulm: CTAB, no wheezes or rales Ext: no edema     Assessment & Plan:

## 2012-02-19 ENCOUNTER — Encounter (HOSPITAL_COMMUNITY): Payer: Self-pay | Admitting: Emergency Medicine

## 2012-02-19 ENCOUNTER — Emergency Department (HOSPITAL_COMMUNITY)
Admission: EM | Admit: 2012-02-19 | Discharge: 2012-02-19 | Disposition: A | Discharge status: Home / Self Care | Payer: Medicaid Other | Source: Home / Self Care | Attending: Emergency Medicine | Admitting: Emergency Medicine

## 2012-02-19 ENCOUNTER — Other Ambulatory Visit (HOSPITAL_COMMUNITY)
Admission: RE | Admit: 2012-02-19 | Discharge: 2012-02-19 | Disposition: A | Discharge status: Home / Self Care | Payer: Medicaid Other | Source: Ambulatory Visit | Attending: Emergency Medicine | Admitting: Emergency Medicine

## 2012-02-19 DIAGNOSIS — B9689 Other specified bacterial agents as the cause of diseases classified elsewhere: Secondary | ICD-10-CM

## 2012-02-19 DIAGNOSIS — A499 Bacterial infection, unspecified: Secondary | ICD-10-CM

## 2012-02-19 DIAGNOSIS — N760 Acute vaginitis: Secondary | ICD-10-CM

## 2012-02-19 DIAGNOSIS — N898 Other specified noninflammatory disorders of vagina: Secondary | ICD-10-CM

## 2012-02-19 DIAGNOSIS — Z113 Encounter for screening for infections with a predominantly sexual mode of transmission: Secondary | ICD-10-CM | POA: Insufficient documentation

## 2012-02-19 LAB — POCT URINALYSIS DIP (DEVICE)
Bilirubin Urine: NEGATIVE
Nitrite: NEGATIVE
Protein, ur: NEGATIVE mg/dL
pH: 5 (ref 5.0–8.0)

## 2012-02-19 MED ORDER — FLUCONAZOLE 150 MG PO TABS: 150.0000 mg | ORAL_TABLET | Freq: Once | ORAL | Status: DC

## 2012-02-19 MED ORDER — METRONIDAZOLE 500 MG PO TABS: 500.0000 mg | ORAL_TABLET | Freq: Two times a day (BID) | ORAL | Status: DC
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
## 2012-02-19 NOTE — ED Notes (Signed)
Pt c/o poss BV x2 days... Hx of BV... Sx today include: white discharge, foul odor... Denies: urinary prob, fevers, vomiting, nauseas, diarrhea, abd/back pain... Pt is alert w/no signs of distress.

## 2012-02-19 NOTE — ED Provider Notes (Signed)
Medical screening examination/treatment/procedure(s) were performed by non-physician practitioner and as supervising physician I was immediately available for consultation/collaboration.  Leslee Home, M.D.   Reuben Likes, MD 02/19/12 [phone]

## 2012-02-24 ENCOUNTER — Telehealth: Payer: Self-pay | Admitting: Clinical

## 2012-02-24 NOTE — Telephone Encounter (Signed)
Clinical Child psychotherapist (CSW) received referral for resources. CSW left a message for pt.  Theresia Bough, MSW, Theresia Majors [phone]

## 2012-03-13 ENCOUNTER — Ambulatory Visit (INDEPENDENT_AMBULATORY_CARE_PROVIDER_SITE_OTHER): Payer: Medicaid Other | Admitting: Emergency Medicine

## 2012-03-13 ENCOUNTER — Encounter: Payer: Self-pay | Admitting: Emergency Medicine

## 2012-03-13 VITALS — BP 124/78 | HR 88 | Temp 98.6°F | Wt 178.0 lb

## 2012-03-13 DIAGNOSIS — F411 Generalized anxiety disorder: Secondary | ICD-10-CM

## 2012-03-13 DIAGNOSIS — F419 Anxiety disorder, unspecified: Secondary | ICD-10-CM

## 2012-03-13 MED ORDER — CLONAZEPAM 1 MG PO TABS: 1.0000 mg | ORAL_TABLET | Freq: Two times a day (BID) | ORAL | Status: DC | PRN

## 2012-03-13 NOTE — Patient Instructions (Addendum)
It was nice to see you! I'm glad the medications are helping. It sounds like there are a few positives today - keep focused on those. Follow up in 1 month.

## 2012-03-13 NOTE — Progress Notes (Signed)
  Subjective:    Patient ID: Louis Matte, female    DOB: Sep 06, 1980, 31 y.o.   MRN: [id number]  HPI KORRIE HOFBAUER is here for f/u of anxiety.  She reports that this are still tough right now.  She and her partner have broken up, but are still living together currently which she finds very difficult.  She does state that she completed her parenting courses and court mediation for custody of her daughter is scheduled for next month.  She reports that the increased dose of klonopin and buspar is helping a lot.  She is working on getting counseling at [employer] [facility], but it has been difficult to schedule anything.   I have reviewed and updated the following as appropriate: allergies and current medications SHx: former smoker; denies marijuana and alcohol  Review of Systems see HPI   Objective:   Physical Exam BP 124/78  Pulse 88  Temp 98.6 F (37 C) (Oral)  Wt 178 lb (80.74 kg) Gen: alert, cooperative, NAD HEENT: AT/[place], sclera white, MMM Neck: supple, no LAD CV: RRR, no murmurs Pulm: CTAB, no wheezes or rales Psych: appears mildly stressed, mood described as anxious, intermittently tearful     Assessment & Plan:

## 2012-03-13 NOTE — Assessment & Plan Note (Addendum)
Doing a little better.  Is able to identify some positive things in her life currently.  Will refill Klonopin 1mg  #60.  Discussed with her tapering the klonopin as things settle down and keeping the Buspar on indefinitely.  Reviewed that klonopin and alcohol do not mix well - okay to have a glass of champagne on NYE.  Encouraged her to continue trying to see [facility] for counseling.  Denies any depressive symptoms, SI/HI.  Follow up in 1 month.

## 2012-04-07 ENCOUNTER — Telehealth: Payer: Self-pay | Admitting: Family Medicine

## 2012-04-07 NOTE — Telephone Encounter (Signed)
Patient calling to let me know that she is having anxiety about constipation. She started taking one-a-day vitamins recently and since that time she has not had a normal bowel movement, but rather small pellets. She also notes bloating. I encouraged her to stop taking the vitamins right now and take a stool softener. She agrees to take dulcolax. She will call back with any other concerns.   Si Raider Clinton Sawyer, MD, MBA 04/07/2012, 7:26 PM Family Medicine Resident, PGY-2 [phone] pager

## 2012-04-13 ENCOUNTER — Encounter: Payer: Self-pay | Admitting: Family Medicine

## 2012-04-13 ENCOUNTER — Ambulatory Visit (INDEPENDENT_AMBULATORY_CARE_PROVIDER_SITE_OTHER): Payer: Medicaid Other | Admitting: Family Medicine

## 2012-04-13 ENCOUNTER — Ambulatory Visit: Payer: Medicaid Other | Admitting: Family Medicine

## 2012-04-13 VITALS — BP 134/82 | HR 77 | Ht 63.0 in | Wt 175.0 lb

## 2012-04-13 DIAGNOSIS — N898 Other specified noninflammatory disorders of vagina: Secondary | ICD-10-CM

## 2012-04-13 DIAGNOSIS — N760 Acute vaginitis: Secondary | ICD-10-CM

## 2012-04-13 LAB — POCT WET PREP (WET MOUNT)

## 2012-04-13 MED ORDER — FLUCONAZOLE 150 MG PO TABS: 150.0000 mg | ORAL_TABLET | Freq: Once | ORAL | Status: DC

## 2012-04-13 NOTE — Assessment & Plan Note (Addendum)
Pelvic exam did reveal discharge, but no skin erythema/lesions.  Normal external rectum. - Will check wet prep and notify of results and treat as necessary - If positive for BV, patient also requesting Diflucan for thrush - Handout given and red flags reviewed - Follow up PRN

## 2012-04-13 NOTE — Progress Notes (Signed)
  Subjective:    Patient ID: Heidi [name], female    DOB: 1980-11-22, 32 y.o.   MRN: [id number]  HPI  Patient presents to same day clinic for vaginal discharge.  Describes it as yellow, thin.  She has had several episodes of BV in the past.  She recently had sexual intercourse with partner (new), but does not want to be checked for STD at this time.  Denies any associated fever, chills.  Denies any pelvic pain, abdominal pain.  Denies any vaginal bleeding, dysuria.  She also complains of "funny feeling" in rectum and would like me to look at it.  She tried shaving back there and thinks it could be due hairs growing back.   Review of Systems  Per HPI    Objective:   Physical Exam  Constitutional: No distress.  Abdominal: Soft. She exhibits no distension. There is no tenderness. There is no rebound and no guarding.  Genitourinary: Rectum normal and uterus normal. There is no rash, tenderness, lesion or injury on the right labia. There is no rash, tenderness, lesion or injury on the left labia. Cervix exhibits discharge. Cervix exhibits no motion tenderness and no friability. Vaginal discharge found.      Assessment & Plan:

## 2012-04-13 NOTE — Patient Instructions (Addendum)
It was nice to meet you today, Heidi [name]. Please refer to handout below regarding common causes of vaginitis. It typically takes about 1-2 days for results to return. If today's lab results are ABNORMAL, we will call you and send medication to your pharmacy.   If you develop worsening symptoms, fever (temperature > 101.5 degrees), nausea/vomiting, or pelvic pain, please return to clinic.  What is vaginitis? Vaginitis is an inflammation of the vagina. Vulvovaginitis refers to inflammation of both the vagina and vulva (the external female genitals).   What are the most common types of vaginitis? The six most common types of vaginitis are:  Candida or "yeast" vaginitis  Bacterial vaginosis  Trichomoniasis vaginitis (a sexually transmitted infection)   What are candida or "yeast" infections? Yeast infections of the vagina are what most women think of when they hear the term "vaginitis." Yeast infections are caused by one of the many species of fungus called candida. Candida normally live in small numbers in the vagina, as well as in the mouth and digestive tract of both men and women.  Yeast infections produce a thick, white vaginal discharge with the consistency of cottage cheese. Although the discharge can be somewhat watery, it is odorless. Yeast infections usually cause the vagina and the vulva to be very itchy and red, even before the onset of discharge.  If yeast is normal in a woman's vagina, what makes it cause an infection? Usually, infection occurs when a change in the delicate balance in a woman's system takes place.   What is bacterial vaginosis? Although "yeast" is the name most women know, bacterial vaginosis (BV) actually is the most common vaginal infection in women of reproductive age. Bacterial vaginosis often will cause a vaginal discharge. The discharge usually is thin and milky, and is described as having a "fishy" odor. This odor may become more noticeable after intercourse.    Redness or itching of the vagina are not common symptoms of bacterial vaginosis. Some women with BV have no symptoms at all, and the vaginitis is only discovered during a routine gynecologic exam. Bacterial vaginosis is caused by a combination of several bacteria. These bacteria seem to overgrow in much the same way as do candida when the vaginal pH balance is upset.  Because bacterial vaginosis is caused by bacteria and not by yeast, medicine that is appropriate for yeast is not effective against the bacteria that cause bacterial vaginosis.  BV is treated in asymptomatic females of child bearing age to decrease their risk of preterm delivery.  Risk factors for BV include:  New or multiple sexual partners  Douching  Cigarette smoking  What is trichomoniasis? Trichomoniasis - Trichomoniasis is caused by a tiny single-celled organism known as a "protozoa." When this organism infects the vagina, it can cause a frothy, greenish-yellow discharge. Often this discharge will have a foul smell. Women with trichomonal vaginitis may complain of itching and soreness of the vagina and vulva, as well as burning during urination. In addition, there can be discomfort in the lower abdomen and vaginal pain with intercourse. These symptoms may be worse after the menstrual period. Many women, however, do not develop any symptoms. It is important to understand that this type of vaginitis can be transmitted through sexual intercourse. For treatment to be effective, the sexual partner must be treated at the same time as the patient.

## 2012-04-14 ENCOUNTER — Telehealth: Payer: Self-pay | Admitting: Family Medicine

## 2012-04-14 MED ORDER — METRONIDAZOLE 500 MG PO TABS: 500.0000 mg | ORAL_TABLET | Freq: Two times a day (BID) | ORAL | Status: DC

## 2012-04-14 NOTE — Telephone Encounter (Signed)
Please call patient and inform her that she has BV and to pick up Flagyl at pharmacy.  Please remind her to swallow the whole pill with apple sauce - do not chew it.  Thanks.

## 2012-04-14 NOTE — Telephone Encounter (Signed)
Spoke with patient and informed her of below 

## 2012-04-14 NOTE — Addendum Note (Signed)
Addended by: Tye Savoy, IVY on: 04/14/2012 12:30 PM   Modules accepted: Orders

## 2012-04-21 ENCOUNTER — Ambulatory Visit (INDEPENDENT_AMBULATORY_CARE_PROVIDER_SITE_OTHER): Payer: Medicaid Other | Admitting: Emergency Medicine

## 2012-04-21 ENCOUNTER — Encounter: Payer: Self-pay | Admitting: Emergency Medicine

## 2012-04-21 VITALS — BP 115/75 | HR 81 | Ht 63.0 in | Wt 177.0 lb

## 2012-04-21 DIAGNOSIS — N760 Acute vaginitis: Secondary | ICD-10-CM

## 2012-04-21 DIAGNOSIS — B9689 Other specified bacterial agents as the cause of diseases classified elsewhere: Secondary | ICD-10-CM

## 2012-04-21 DIAGNOSIS — A499 Bacterial infection, unspecified: Secondary | ICD-10-CM

## 2012-04-21 DIAGNOSIS — F419 Anxiety disorder, unspecified: Secondary | ICD-10-CM

## 2012-04-21 DIAGNOSIS — F411 Generalized anxiety disorder: Secondary | ICD-10-CM

## 2012-04-21 MED ORDER — CLONAZEPAM 1 MG PO TABS: 1.0000 mg | ORAL_TABLET | Freq: Two times a day (BID) | ORAL | Status: DC | PRN

## 2012-04-21 MED ORDER — BUSPIRONE HCL 15 MG PO TABS: 15.0000 mg | ORAL_TABLET | Freq: Two times a day (BID) | ORAL | Status: DC

## 2012-04-21 MED ORDER — METRONIDAZOLE 500 MG PO TABS: 500.0000 mg | ORAL_TABLET | Freq: Two times a day (BID) | ORAL | Status: DC

## 2012-04-21 NOTE — Patient Instructions (Addendum)
It was nice to see you! I'm sorry things aren't going well. I want you to focus on your daughter and the progress you have made there. I am increasing you BuSpar to 15mg  twice a day. We may need to start an SSRI next time, if things aren't getting better. Follow up in 1 month or sooner if needed.
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
## 2012-04-21 NOTE — Assessment & Plan Note (Addendum)
Stable from last visit.  Continue klonopin 1mg  BID prn.  Will increase BuSpar to 15mg  BID.  Although she denies feeling of depression, I suspect that this is likely playing a role as well.  Discussed with her that if she is not doing better and feeling better about herself at f/u in 1 month, we will need to seriously consider starting as SSRI as well.  Will need to make sure she is taking Seroquel as well.

## 2012-04-29 ENCOUNTER — Telehealth: Payer: Self-pay | Admitting: Family Medicine
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
## 2012-05-07 ENCOUNTER — Telehealth: Payer: Self-pay | Admitting: Sports Medicine

## 2012-05-07 MED ORDER — FLUCONAZOLE 150 MG PO TABS: 150.0000 mg | ORAL_TABLET | Freq: Once | ORAL | Status: DC

## 2012-05-07 NOTE — Telephone Encounter (Signed)
24 Hour Emergency Call Line (Weekend):  Pt called to report that she has been having symptoms of oral thrush since taking course of Flagyl for BV.  Rx for Diflucan was supposed to be sent in 3 weeks ago but was not received at pharmacy.  Symptomatic and would like refill.  Has been unable to be seen due to weather.   Rx for Diflucan sent in but explained normally don't do this but due to weather and inability to be seen will give 1 time refill.  To follow up with PCP >Consider HIV test given recurrence of oral thrush.  Andrena Mews, DO [facility] Resident - PGY-2 05/07/2012 12:39 PM

## 2012-05-18 ENCOUNTER — Ambulatory Visit (INDEPENDENT_AMBULATORY_CARE_PROVIDER_SITE_OTHER): Payer: Medicaid Other | Admitting: Emergency Medicine

## 2012-05-18 ENCOUNTER — Encounter: Payer: Self-pay | Admitting: Emergency Medicine

## 2012-05-18 VITALS — BP 117/80 | HR 69 | Ht 63.0 in | Wt 176.5 lb

## 2012-05-18 DIAGNOSIS — F411 Generalized anxiety disorder: Secondary | ICD-10-CM

## 2012-05-18 DIAGNOSIS — F419 Anxiety disorder, unspecified: Secondary | ICD-10-CM

## 2012-05-18 DIAGNOSIS — E162 Hypoglycemia, unspecified: Secondary | ICD-10-CM

## 2012-05-18 LAB — GLUCOSE, CAPILLARY: Glucose-Capillary: 108 mg/dL — ABNORMAL HIGH (ref 70–99)

## 2012-05-18 MED ORDER — CLONAZEPAM 1 MG PO TABS: 1.0000 mg | ORAL_TABLET | Freq: Two times a day (BID) | ORAL | Status: DC | PRN

## 2012-05-18 NOTE — Progress Notes (Signed)
  Subjective:    Patient ID: Heidi [name], female    DOB: 1981/02/19, 32 y.o.   MRN: [id number]  HPI DARE SPILLMAN is here for anxiety f/u and med refill.  She reports her anxiety is doing okay.  Does still require klonopin BID most every day.  Grandmother is currently sick in the hospital and not doing well and there is a conflict with GOC with her mother.  She does get to see her daughter which is a bright spot for her.  She is also no longer living with her ex-girlfriend.  Reports improvement in the "down on myself" feelings.  Takes seroquel intermittently if she does not have to do anything the next morning as it makes her feel very sedated.  Also has some concerns about blood sugars going up and down.  Reports feeling really hungry, but then having hypoglycemic episodes with jitteriness, sweating, and shaking.  Reports eating a high starch diet.  I have reviewed and updated the following as appropriate: allergies, current medications, past family history, past medical history, past social history and past surgical history SHx: former smoker   Review of Systems See HPI    Objective:   Physical Exam BP 117/80  Pulse 69  Ht 5\' 3"  (1.6 m)  Wt 176 lb 8 oz (80.06 kg)  BMI 31.27 kg/m2  LMP 04/17/2012 Gen: alert, cooperative, NAD, does become tearful when discussing grandmother HEENT: AT/, sclera white, MMM Neck: supple CV: RRR, no murmurs Pulm: CTAB, no wheezes or rales      Assessment & Plan:

## 2012-05-18 NOTE — Patient Instructions (Addendum)
It was nice to see you! I'm sorry to hear about your Grandma.  But I'm very happy to hear about your daughter! Remember to take care of yourself. Follow up in 1 month.

## 2012-05-18 NOTE — Assessment & Plan Note (Signed)
Will check CBG today.  Discussed low carb diet to help regular sugar fluctuations.  Atkins diet handout provided.

## 2012-05-18 NOTE — Assessment & Plan Note (Signed)
Stable to mildly improved.  Will continue buspar 15mg  BID and clonopin 1mg  BID prn.  Depending on how she is next month, may need to discuss starting seroquel daily but at a lower dose.  Less concern for concurrent depression today, will hold off on starting SSRI.

## 2012-05-19 ENCOUNTER — Telehealth: Payer: Self-pay | Admitting: Emergency Medicine

## 2012-05-19 DIAGNOSIS — N760 Acute vaginitis: Secondary | ICD-10-CM

## 2012-05-19 MED ORDER — FLUCONAZOLE 150 MG PO TABS: 150.0000 mg | ORAL_TABLET | Freq: Once | ORAL | Status: DC

## 2012-05-19 MED ORDER — METRONIDAZOLE 500 MG PO TABS: 500.0000 mg | ORAL_TABLET | Freq: Two times a day (BID) | ORAL | Status: DC

## 2012-05-19 NOTE — Telephone Encounter (Signed)
Called and spoke with patient to clarify.  She was seen 4 weeks ago and diagnosed with BV.  She states that she did not get that prescription.  Also often has oral and/or vaginal candidiasis after antibiotics.  Will send in flagyl and diflucan.  She has f/u in 1 month.

## 2012-05-19 NOTE — Telephone Encounter (Signed)
Patient is calling because she thought an Rx for Flagyl was going to be sent to [facility] on [address], but they don't have it.  She would like for it to be sent today please.

## 2012-05-19 NOTE — Telephone Encounter (Signed)
Will fwd. To Dr.Booth. Do not see note re: Flagyl. Lorenda Hatchet, [name]

## 2012-05-29 ENCOUNTER — Telehealth: Payer: Self-pay | Admitting: Emergency Medicine

## 2012-05-29 DIAGNOSIS — B9689 Other specified bacterial agents as the cause of diseases classified elsewhere: Secondary | ICD-10-CM

## 2012-05-29 MED ORDER — METRONIDAZOLE 500 MG PO TABS: 500.0000 mg | ORAL_TABLET | Freq: Two times a day (BID) | ORAL | Status: DC

## 2012-05-29 NOTE — Telephone Encounter (Signed)
Pharmacy never got the Rx from 2/28 on Flagyl and Dr. Elwyn Reach was going to send another, but that didn't happen.

## 2012-06-01 ENCOUNTER — Telehealth: Payer: Self-pay | Admitting: Emergency Medicine

## 2012-06-01 NOTE — Telephone Encounter (Signed)
Returned call to patient and left message to call our office back.  Richardson, Jeannette Ann, RN  

## 2012-06-01 NOTE — Telephone Encounter (Signed)
Patient called our office back.  Had increased heart rate & "fluttering."  Took her anxiety med (klonopin) and symptoms improved.  Requesting appt for eval or will go to urgent care when she gets off work tomorrow afternoon.  Appt scheduled for tomorrow at 3:00 pm with Dr. Casper Harrison.  Patient will go to urgent care or ED before tomorrow's appt if needed for her anxiety.  Gaylene Brooks, RN

## 2012-06-01 NOTE — Telephone Encounter (Signed)
Pt is asking to speak with nurse about heart fluttering's/palpitations/anxiety - not sure what to do.

## 2012-06-02 ENCOUNTER — Ambulatory Visit: Payer: Medicaid Other | Admitting: Family Medicine

## 2012-06-03 ENCOUNTER — Telehealth: Payer: Self-pay | Admitting: Family Medicine

## 2012-06-03 NOTE — Telephone Encounter (Signed)
Patient calling to let me know that she experience heart flutter and wants to know if it can be caused by anxiety or caffeine. I told her that both can be causes of flutter. She denies pain or SOB. She has not history of dysrhythmia. I counseled her to decrease caffeine intake to see if it improves, and she was agreeable. She will follow up if she develops pain, SOB, or syncope.

## 2012-06-07 ENCOUNTER — Ambulatory Visit (INDEPENDENT_AMBULATORY_CARE_PROVIDER_SITE_OTHER): Payer: Medicaid Other | Admitting: Family Medicine

## 2012-06-07 ENCOUNTER — Emergency Department (HOSPITAL_COMMUNITY): Admission: EM | Admit: 2012-06-07 | Discharge: 2012-06-07 | Payer: Medicaid Other | Source: Home / Self Care

## 2012-06-07 ENCOUNTER — Encounter: Payer: Self-pay | Admitting: Family Medicine

## 2012-06-07 VITALS — BP 120/68 | HR 71 | Temp 98.3°F | Ht 63.0 in | Wt 179.0 lb

## 2012-06-07 DIAGNOSIS — R300 Dysuria: Secondary | ICD-10-CM

## 2012-06-07 LAB — POCT URINALYSIS DIPSTICK
Glucose, UA: NEGATIVE
Nitrite, UA: NEGATIVE
Protein, UA: NEGATIVE
Urobilinogen, UA: 1
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
## 2012-06-26 ENCOUNTER — Ambulatory Visit: Payer: Medicaid Other | Admitting: Emergency Medicine

## 2012-06-26 ENCOUNTER — Telehealth: Payer: Self-pay | Admitting: Emergency Medicine

## 2012-06-26 DIAGNOSIS — F419 Anxiety disorder, unspecified: Secondary | ICD-10-CM

## 2012-06-26 MED ORDER — CLONAZEPAM 1 MG PO TABS: 1.0000 mg | ORAL_TABLET | Freq: Two times a day (BID) | ORAL | Status: DC | PRN

## 2012-06-26 NOTE — Telephone Encounter (Signed)
Will call in Klonopin 1mg  #40 tablets which should get her to her appt on 4/25.

## 2012-06-26 NOTE — Telephone Encounter (Signed)
Called pt. Left message. Lorenda Hatchet, [name]

## 2012-06-26 NOTE — Telephone Encounter (Signed)
Will fwd. To PCP and call her after review. Lorenda Hatchet, [name]

## 2012-06-26 NOTE — Telephone Encounter (Signed)
Pt cannot come to appt today because of having to go into work - she is going thru a lot right now and has had to move out and is staying with a friend.  Needs to know if she can get her anxiety meds until her next appt on 4/25 pls advise

## 2012-06-27 NOTE — Telephone Encounter (Signed)
Pt never called back. .Gwyndolyn Guilford  

## 2012-06-29 ENCOUNTER — Encounter (HOSPITAL_BASED_OUTPATIENT_CLINIC_OR_DEPARTMENT_OTHER): Payer: Self-pay | Admitting: *Deleted

## 2012-06-29 ENCOUNTER — Emergency Department (HOSPITAL_BASED_OUTPATIENT_CLINIC_OR_DEPARTMENT_OTHER)
Admission: EM | Admit: 2012-06-29 | Discharge: 2012-06-29 | Disposition: A | Discharge status: Home / Self Care | Payer: Medicaid Other | Attending: Emergency Medicine | Admitting: Emergency Medicine

## 2012-06-29 DIAGNOSIS — Z8742 Personal history of other diseases of the female genital tract: Secondary | ICD-10-CM | POA: Insufficient documentation

## 2012-06-29 DIAGNOSIS — Z79899 Other long term (current) drug therapy: Secondary | ICD-10-CM | POA: Insufficient documentation

## 2012-06-29 DIAGNOSIS — L299 Pruritus, unspecified: Secondary | ICD-10-CM | POA: Insufficient documentation

## 2012-06-29 DIAGNOSIS — L738 Other specified follicular disorders: Secondary | ICD-10-CM | POA: Insufficient documentation

## 2012-06-29 DIAGNOSIS — IMO0002 Reserved for concepts with insufficient information to code with codable children: Secondary | ICD-10-CM | POA: Insufficient documentation

## 2012-06-29 DIAGNOSIS — L739 Follicular disorder, unspecified: Secondary | ICD-10-CM

## 2012-06-29 DIAGNOSIS — Z87891 Personal history of nicotine dependence: Secondary | ICD-10-CM | POA: Insufficient documentation

## 2012-06-29 DIAGNOSIS — F411 Generalized anxiety disorder: Secondary | ICD-10-CM | POA: Insufficient documentation

## 2012-06-29 DIAGNOSIS — F3289 Other specified depressive episodes: Secondary | ICD-10-CM | POA: Insufficient documentation

## 2012-06-29 DIAGNOSIS — F329 Major depressive disorder, single episode, unspecified: Secondary | ICD-10-CM | POA: Insufficient documentation

## 2012-06-29 NOTE — ED Notes (Signed)
Rash on her neck for a week. Now it has spread to her face.

## 2012-06-29 NOTE — ED Notes (Signed)
Pt. Reports itching with no edema noted.
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
## 2012-06-30 NOTE — ED Provider Notes (Signed)
Medical screening examination/treatment/procedure(s) were conducted as a shared visit with non-physician practitioner(s) and myself.  I personally evaluated the patient during the encounter  Pt with healing folliculitis from plucking hairs on chin. No abscess or cellulitis  Bradlee Heitman B. Bernette Mayers, MD 06/30/12 1102

## 2012-07-14 ENCOUNTER — Ambulatory Visit (INDEPENDENT_AMBULATORY_CARE_PROVIDER_SITE_OTHER): Payer: Medicaid Other | Admitting: Emergency Medicine

## 2012-07-14 ENCOUNTER — Encounter: Payer: Self-pay | Admitting: Emergency Medicine

## 2012-07-14 VITALS — BP 117/76 | HR 67 | Ht 63.0 in | Wt 175.6 lb

## 2012-07-14 DIAGNOSIS — F411 Generalized anxiety disorder: Secondary | ICD-10-CM

## 2012-07-14 DIAGNOSIS — F419 Anxiety disorder, unspecified: Secondary | ICD-10-CM

## 2012-07-14 DIAGNOSIS — R51 Headache: Secondary | ICD-10-CM

## 2012-07-14 DIAGNOSIS — J309 Allergic rhinitis, unspecified: Secondary | ICD-10-CM

## 2012-07-14 MED ORDER — CLONAZEPAM 1 MG PO TABS: 1.0000 mg | ORAL_TABLET | Freq: Two times a day (BID) | ORAL | Status: DC | PRN

## 2012-07-14 MED ORDER — CETIRIZINE HCL 10 MG PO TABS: 10.0000 mg | ORAL_TABLET | Freq: Every day | ORAL | Status: DC

## 2012-07-14 NOTE — Patient Instructions (Addendum)
It was nice to see you! You look happier! Follow up in 1 month.
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
## 2012-07-14 NOTE — Assessment & Plan Note (Signed)
Refilled zyrtec today.

## 2012-07-14 NOTE — Assessment & Plan Note (Signed)
She appears much happier today, no tears.  Still with lots going on. Will continue clonopin 1mg  BID prn this month.  Continue buspar.  Will d/c seroquel as she has not been taking it and monitor.  Follow up in 1 month.

## 2012-07-29 ENCOUNTER — Telehealth: Payer: Self-pay | Admitting: Family Medicine

## 2012-07-29 NOTE — Telephone Encounter (Signed)
EMERGENCY LINE CALL:  Pt reports she got bit on her arm, and saw a really small white/clear spider.  It happened about 15 minutes ago and nothing has happened.  She reports she has really bad anxiety and just wants to make sure that nothing bad is going to happen from this clear/white spider.  She feels fine now- no SOB, CP, sweating, dizziness, N/V; no redness or swelling in her arm.  Is pretty sure she will be fine but gets really anxious and just wants someone to confirm that she will be ok.  Discussed with pt that she will likely be fine, discussed that she may have a local reaction if any reaction. Told her she could put some ice on the area if she was worried.  Discussed red flags for anaphylactic reaction and infection as reasons to go to ER or UC today.

## 2012-08-21 ENCOUNTER — Ambulatory Visit: Payer: Medicaid Other | Admitting: Family Medicine

## 2012-08-25 ENCOUNTER — Ambulatory Visit (INDEPENDENT_AMBULATORY_CARE_PROVIDER_SITE_OTHER): Payer: Medicaid Other | Admitting: Family Medicine

## 2012-08-25 VITALS — BP 122/84 | Ht 64.0 in | Wt 170.0 lb

## 2012-08-25 DIAGNOSIS — L84 Corns and callosities: Secondary | ICD-10-CM

## 2012-08-25 DIAGNOSIS — M25579 Pain in unspecified ankle and joints of unspecified foot: Secondary | ICD-10-CM

## 2012-08-25 NOTE — Progress Notes (Signed)
  Subjective:    Patient ID: Heidi [name], female    DOB: 03-07-1981, 32 y.o.   MRN: [id number]  HPI  Bilateral foot pain worsening over the last 2-3 months. She is working a job where she is on her feet 8 hours a day, standing on concrete. We had previously made her some orthotics but she lost those when she moved recently. Since then her they have been bothering her more.  Review of Systems No numbness or tingling in her feet. She's not noticed any rash, erythema or other lesions except for a small wart on her left little toe which is new.    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 Tender to palpation at the origin of the plantar fascia bilaterally. SKIN: Small wart on the left little toe, does not appear to be infected or inflamed. NEURO: Sensation at feet soft touch intact bilaterally. Dorsalis pedis pulses are 2+ bilaterally equal       Assessment & Plan:  Foot pain #2. Corn on left little toe PLAN: I put her in some scaphoid pads on green insoles. Start her back on her foot exercises and gave her extract. She get over-the-counter corn pad. I'll see her back in 4-6 weeks to see how she's doing.

## 2012-08-25 NOTE — Patient Instructions (Addendum)
Start the exercises DAILY 1. Pick up sponge pieces--20 X 2 each side 2. Tippy toes exercise on single foot---50 a day each side 3. At night, when your feet hurt a lot, sit down and roll them over a frozen Coke pottle for 5 minutes each foot--this stretches everything and ices it at the same time

## 2012-08-28 ENCOUNTER — Telehealth: Payer: Self-pay | Admitting: Emergency Medicine

## 2012-08-28 NOTE — Telephone Encounter (Signed)
Patient is calling for a refill on the medicine for Vaginitis.  Also, if Dr. Elwyn Reach will leave a couple extra refills on it as well for when this instances occur.  Please call when this has been done.

## 2012-08-29 MED ORDER — FLUCONAZOLE 150 MG PO TABS: 150.0000 mg | ORAL_TABLET | Freq: Once | ORAL | Status: DC

## 2012-08-29 MED ORDER — METRONIDAZOLE 500 MG PO TABS: 500.0000 mg | ORAL_TABLET | Freq: Two times a day (BID) | ORAL | Status: DC

## 2012-08-29 NOTE — Telephone Encounter (Signed)
I have sent in prescriptions for both flagyl and diflucan (to be taken after completing course of flagyl).  If she has recurrent discharge, I will need to see her in the office.

## 2012-08-31 ENCOUNTER — Telehealth: Payer: Self-pay | Admitting: Emergency Medicine

## 2012-08-31 NOTE — Telephone Encounter (Signed)
Pt reports that her heart is fluttering for the past four days " I don't know if it is from my anxiety and taking excedrin with caffeine and drinking lots of soda"  Recommended stopping excedrin and drinking lots of water, reducing caffeine intake. Pt denies chest pain, shortness of breath or nausea. Informed pt if any of these signs occur to go directly to the ED - if fluttering continues after stopping caffeine and excedrin to call first thing Monday morning for appointment. Pt states " I don't think that I need to come in but I will if this doesn't stop"  Pt verbalized understanding of instructions.  Wyatt Haste, RN-BSN

## 2012-08-31 NOTE — Telephone Encounter (Signed)
Pt states heart has been fluttering for several days,. She wonders if this is due to taking excedrin every day for headaches.  Please advise

## 2012-09-02 ENCOUNTER — Telehealth: Payer: Self-pay | Admitting: Family Medicine

## 2012-09-02 NOTE — Telephone Encounter (Signed)
Patient called to see if rx for flagyl had been sent to pharmacy.  Informed it was sent to [facility] on [address].

## 2012-09-04 ENCOUNTER — Encounter: Payer: Medicaid Other | Admitting: Emergency Medicine

## 2012-09-07 ENCOUNTER — Telehealth: Payer: Self-pay | Admitting: Family Medicine

## 2012-09-07 NOTE — Telephone Encounter (Signed)
Patient called emergency line regarding some palpitations and feeling anxious. She is anxious regarding her GM going to hospice. She questions if anxiety can cause palpitations. We discussed is possible but I cannot rule out worrisome conditions over the phone. Advised to avoid use of caffeine, excedrin and seek emergency care for unresolving dyspnea, chest pain or worsening.

## 2012-09-12 ENCOUNTER — Telehealth: Payer: Self-pay | Admitting: Emergency Medicine
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
## 2012-09-12 NOTE — Telephone Encounter (Signed)
Okay to call in refill of Klonopin 1mg  BID prn #60, refills 0.  Will need appointment with me in the next few weeks.

## 2012-09-12 NOTE — Telephone Encounter (Signed)
Will fwd to Md.  Allin Frix L, CMA  

## 2012-09-12 NOTE — Telephone Encounter (Signed)
Called in Klonopin per MD.  Heidi [name] pt, she is in the hospital with her grandmother in hospice.  She is just now on morphine.  Pt is crying on the phone.  Instructed patient if she gets worse to please call our office, denies HI/SI, scheduled appt with Dr. Elwyn Reach for 09/20/2012.  Zyheir Daft, Darlyne Russian, CMA

## 2012-09-20 ENCOUNTER — Ambulatory Visit: Payer: Medicaid Other | Admitting: Emergency Medicine

## 2012-10-11 ENCOUNTER — Ambulatory Visit: Payer: Medicaid Other | Admitting: Emergency Medicine

## 2012-10-18 ENCOUNTER — Ambulatory Visit: Payer: Medicaid Other | Admitting: Emergency Medicine

## 2012-10-18 ENCOUNTER — Encounter: Payer: Self-pay | Admitting: Emergency Medicine

## 2012-10-18 ENCOUNTER — Ambulatory Visit (INDEPENDENT_AMBULATORY_CARE_PROVIDER_SITE_OTHER): Payer: Medicaid Other | Admitting: Emergency Medicine

## 2012-10-18 VITALS — BP 121/79 | HR 69 | Ht 64.0 in | Wt 170.0 lb

## 2012-10-18 DIAGNOSIS — F411 Generalized anxiety disorder: Secondary | ICD-10-CM

## 2012-10-18 DIAGNOSIS — J309 Allergic rhinitis, unspecified: Secondary | ICD-10-CM

## 2012-10-18 DIAGNOSIS — F419 Anxiety disorder, unspecified: Secondary | ICD-10-CM

## 2012-10-18 DIAGNOSIS — R51 Headache: Secondary | ICD-10-CM

## 2012-10-18 MED ORDER — ALBUTEROL SULFATE HFA 108 (90 BASE) MCG/ACT IN AERS: 2.0000 | INHALATION_SPRAY | Freq: Four times a day (QID) | RESPIRATORY_TRACT | Status: DC | PRN

## 2012-10-18 MED ORDER — CETIRIZINE HCL 10 MG PO TABS: 10.0000 mg | ORAL_TABLET | Freq: Every day | ORAL | Status: DC

## 2012-10-18 MED ORDER — CLONAZEPAM 0.5 MG PO TABS: 0.5000 mg | ORAL_TABLET | Freq: Two times a day (BID) | ORAL | Status: DC | PRN

## 2012-10-18 NOTE — Assessment & Plan Note (Signed)
Doing much better in her new relationship. Will stop Buspar as it is making her drowsy and only taking it on weekends. Decreased klonopin to 0.5mg  BID prn. Follow up in 1 month.

## 2012-10-18 NOTE — Assessment & Plan Note (Signed)
Refilled cetirizine today. Prescription for albuterol given to help with allergic cough.  Discussed that this may not help much. Follow up prn.

## 2012-10-18 NOTE — Patient Instructions (Addendum)
It was wonderful to see you! I'm glad you are doing better.  Follow up in 1 month.  We will do a pap smear at that appointment.

## 2012-10-18 NOTE — Progress Notes (Signed)
  Subjective:    Patient ID: Heidi [name], female    DOB: Sep 29, 1980, 32 y.o.   MRN: [id number]  HPI Heidi [name] is here for med refill.  Anxiety She reports her anxiety is much better.  Her grandmother passed away the end of last month and this was stressful for her, but she is dealing with the death well.  Work remains stressful.  She is in a new relationship, and is very happy in it.  Her partner is very loving and they do argue.  She is also spending more time with her daughter.  She would like to decrease the klonopin to 0.5mg .  Also reports that the Buspar is making her tired so she only takes in on weekends.  No palpitations or shortness of breath.  Allergies States she needs a refill of her cetirizine.  Also reports lots of coughing at work related to dust exposure.  No fevers, chills.  I have reviewed and updated the following as appropriate: allergies and current medications SHx: former smoker   Review of Systems See HPI    Objective:   Physical Exam BP 121/79  Pulse 69  Ht 5\' 4"  (1.626 m)  Wt 170 lb (77.111 kg)  BMI 29.17 kg/m2 Gen: alert, cooperative, NAD HEENT: AT/[place], sclera white, MMM Neck: supple CV: RRR, no murmurs Pulm: CTAB, no wheezes or rales Psych: she is much calmer and smiling more than prior visits; gets appropriate tearful when discussing her grandmother's death     Assessment & Plan:

## 2012-10-23 ENCOUNTER — Telehealth: Payer: Self-pay | Admitting: Emergency Medicine

## 2012-10-23 NOTE — Telephone Encounter (Signed)
Left message for pt to return call if further questions. Wyatt Haste, RN-BSN
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
## 2012-10-29 ENCOUNTER — Telehealth: Payer: Self-pay | Admitting: Family Medicine

## 2012-10-29 NOTE — Telephone Encounter (Signed)
[facility] After Hours Line.   States at visit 10/18/12 that Dr. Elwyn Reach had planned to refill flexeril for muscle spasms that she has at work when lifting repetitively. Do not see this discussed in the note. Told patient I would be happy to forward message to PCP who can fill that this week if she thinks it is appropriate.   Aldine Contes. Marti Sleigh, MD, PGY3 10/29/2012 2:26 PM

## 2012-10-30 ENCOUNTER — Telehealth: Payer: Self-pay | Admitting: Emergency Medicine

## 2012-10-30 MED ORDER — CYCLOBENZAPRINE HCL 10 MG PO TABS: 10.0000 mg | ORAL_TABLET | Freq: Two times a day (BID) | ORAL | Status: DC | PRN

## 2012-10-30 NOTE — Telephone Encounter (Signed)
Attempted to call patient, mailbox is full. Please let her know the flexeril was sent to her pharmacy.[name], Heidi [name]

## 2012-10-30 NOTE — Telephone Encounter (Signed)
Okay to fill flexeril.  Will send prescription to pharmacy.

## 2012-11-22 ENCOUNTER — Telehealth: Payer: Self-pay | Admitting: Emergency Medicine

## 2012-11-22 NOTE — Telephone Encounter (Signed)
Attempted to call pt - phone not accepting calls - pt will need appointment for this refill Wyatt Haste, RN-BSN

## 2012-11-22 NOTE — Telephone Encounter (Signed)
Pt is requesting refill of metronidazole be sent to her pharmacy on file. JW

## 2012-11-28 ENCOUNTER — Ambulatory Visit: Payer: Medicaid Other | Admitting: Family Medicine

## 2012-12-05 ENCOUNTER — Ambulatory Visit (INDEPENDENT_AMBULATORY_CARE_PROVIDER_SITE_OTHER): Payer: Medicaid Other | Admitting: Family Medicine

## 2012-12-05 ENCOUNTER — Encounter: Payer: Self-pay | Admitting: Family Medicine

## 2012-12-05 VITALS — BP 127/79 | HR 65 | Ht 64.0 in | Wt 167.0 lb

## 2012-12-05 DIAGNOSIS — N760 Acute vaginitis: Secondary | ICD-10-CM

## 2012-12-05 DIAGNOSIS — E162 Hypoglycemia, unspecified: Secondary | ICD-10-CM

## 2012-12-05 DIAGNOSIS — F419 Anxiety disorder, unspecified: Secondary | ICD-10-CM

## 2012-12-05 DIAGNOSIS — D649 Anemia, unspecified: Secondary | ICD-10-CM

## 2012-12-05 DIAGNOSIS — F411 Generalized anxiety disorder: Secondary | ICD-10-CM

## 2012-12-05 LAB — POCT WET PREP (WET MOUNT)

## 2012-12-05 LAB — CBC
MCH: 20.4 pg — ABNORMAL LOW (ref 26.0–34.0)
MCHC: 32.5 g/dL (ref 30.0–36.0)
Platelets: 243 10*3/uL (ref 150–400)
RBC: 5.74 MIL/uL — ABNORMAL HIGH (ref 3.87–5.11)
RDW: 16.9 % — ABNORMAL HIGH (ref 11.5–15.5)

## 2012-12-05 LAB — BASIC METABOLIC PANEL
BUN: 14 mg/dL (ref 6–23)
CO2: 25 mEq/L (ref 19–32)
Calcium: 8.9 mg/dL (ref 8.4–10.5)
Creat: 0.85 mg/dL (ref 0.50–1.10)
Glucose, Bld: 82 mg/dL (ref 70–99)

## 2012-12-05 MED ORDER — CLONAZEPAM 0.5 MG PO TABS: 0.5000 mg | ORAL_TABLET | Freq: Two times a day (BID) | ORAL | Status: DC | PRN

## 2012-12-05 MED ORDER — METRONIDAZOLE 500 MG PO TABS: 500.0000 mg | ORAL_TABLET | Freq: Two times a day (BID) | ORAL | Status: DC

## 2012-12-05 NOTE — Patient Instructions (Addendum)
It was a pleasure to see you today.  Your wet prep does confirm a diagnosis of bacterial vaginosis.  I sent in the prescription for metronidazole (Flagyl) 500mg , take 1 tablet twice daily for 7 days.   For the shakes/clammy sensation, I am checking labs that include your sugar, thyroid, and a blood count.  I ask that you follow up on this issue with your primary doctor.  I am refilling your Klonopin today as well.  If the labs are normal, then I think we must consider whether these episodes are related to your anxiety.   Follow up in 2 to 4 weeks with Dr Piedad Climes.

## 2012-12-05 NOTE — Assessment & Plan Note (Signed)
Describes episodes of what she says feels like "low blood sugar", does not check at home.  Feels clammy and palpitations; shaky.  Feels this way this morning, although she ate a bagel with cream cheese, sausage and eggs before coming to clinic this morning.  Not tachycardic on my exam.  Will check BMet (nonfasting glucose), CBC and TSH today. JB

## 2012-12-06 ENCOUNTER — Encounter: Payer: Self-pay | Admitting: Family Medicine
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
## 2012-12-06 NOTE — Assessment & Plan Note (Signed)
Checking CBC in light of her symptoms of clammy hands and shakes.

## 2012-12-06 NOTE — Assessment & Plan Note (Signed)
Clue cells on wet prep.  For treatment with flagyl orally today, for one week.

## 2012-12-06 NOTE — Assessment & Plan Note (Signed)
Refills of Klonopin according to her previous maintenance prescription.  I am suspicious that the episodes she describes as "low blood sugar" are actually panic attacks.  I have ordered glucose (metabolic panel), TSH today to check for organic causes, as she states she has this feeling this morning during our visit.  If not, I have told her that if labs are normal then I am inclined to think that her symptoms are related to her panic disorder.

## 2013-01-14 ENCOUNTER — Telehealth: Payer: Self-pay | Admitting: Family Medicine

## 2013-01-14 NOTE — Telephone Encounter (Signed)
[facility] Line  Patient calls emergency line with multiple worries. She is concerned about her blood sugar checked at 9/16 visit and wanted to get explanation of Dr. Marinell Blight letter to her after that visit. Pt came in at that time with symptoms of anxiety that she was worried were related to "hypoglyecemia" with anxiety and palpitations. Her labwork at that time was WNL (CBC mildly anemic but pt on period, BMET with random glucose WNL, TSH WNL) and she was told to follow up with her PCP to evaluate for possible panic attacks. She has not been back since then but reports similar symptoms now. She is always hungry and feels "worried" if she does not eat enough sugar. She is nervous, shaky and calms down if she eats sugar. She is also worrying to the point of having headaches for which she is taking daily excedrin. Denies syncope, fevers, chills, chest pain, dizziness, vision changes, nausea, vomiting, or diarrhea. Reports palpitations and worries about breast cysts coming back.  - Symptoms of anxiety and palpitations - Likely anxiety-related, as symptoms come on when she begins worrying about blood sugar or cysts - Headaches - possibly anxiety-related and also possibly analgesia or withdrawal-related with nearly daily excedrin use - Encouraged patient to follow up this week in clinic with her PCP if available for evaluation. Could repeat fasting or random glucose. Would also further evaluate any triggers for anxiety including if pt uses substances or caffeine. - Return precautions reviewed including reasons to come to ED (chest pain, shortness of breath, fainting, other concerning symptoms).  Leona Singleton, MD 01/14/2013 5:18 PM

## 2013-01-16 ENCOUNTER — Telehealth: Payer: Self-pay | Admitting: Emergency Medicine

## 2013-01-16 NOTE — Telephone Encounter (Signed)
Emergency Line Call She is calling about some mild abdominal pain.  She is taking pretty much daily Excedrin for mild headache.  Discussed that potentially the aspirin in the Excedrin could cause some upset stomach.  She will lay off the Excedrin - encouraged non-pharmacologic strategies for headache management.  She is planning on scheduling an appointment with me to follow up her breast cysts.

## 2013-02-01 ENCOUNTER — Encounter: Payer: Self-pay | Admitting: Emergency Medicine

## 2013-02-01 ENCOUNTER — Ambulatory Visit (INDEPENDENT_AMBULATORY_CARE_PROVIDER_SITE_OTHER): Payer: Medicaid Other | Admitting: Emergency Medicine

## 2013-02-01 VITALS — BP 109/75 | HR 65 | Temp 98.1°F | Ht 63.0 in | Wt 172.0 lb

## 2013-02-01 DIAGNOSIS — Z0000 Encounter for general adult medical examination without abnormal findings: Secondary | ICD-10-CM

## 2013-02-01 DIAGNOSIS — F411 Generalized anxiety disorder: Secondary | ICD-10-CM

## 2013-02-01 DIAGNOSIS — N6009 Solitary cyst of unspecified breast: Secondary | ICD-10-CM

## 2013-02-01 MED ORDER — FLUCONAZOLE 150 MG PO TABS: 150.0000 mg | ORAL_TABLET | Freq: Once | ORAL | Status: DC

## 2013-02-01 MED ORDER — CEPHALEXIN 500 MG PO CAPS: 500.0000 mg | ORAL_CAPSULE | Freq: Four times a day (QID) | ORAL | Status: DC

## 2013-02-01 MED ORDER — ALBUTEROL SULFATE HFA 108 (90 BASE) MCG/ACT IN AERS: 2.0000 | INHALATION_SPRAY | Freq: Four times a day (QID) | RESPIRATORY_TRACT | Status: DC | PRN

## 2013-02-01 MED ORDER — CYCLOBENZAPRINE HCL 10 MG PO TABS: 10.0000 mg | ORAL_TABLET | Freq: Two times a day (BID) | ORAL | Status: DC | PRN

## 2013-02-01 NOTE — Patient Instructions (Signed)
It was nice to see you! You are handling everything very well.  I sent in an antibiotic called Keflex to help with the cysts.  Take if 4 times a day for 1 week.  Follow up in 3 months or sooner if needed.

## 2013-02-01 NOTE — Assessment & Plan Note (Signed)
Several small superficial cysts present between the breasts. Reported drainage at home. Will treat with keflex x1 week.
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
## 2013-02-01 NOTE — Assessment & Plan Note (Signed)
Doing well from an anxiety standpoint. She will be due for refill of her klonopin in 1 month.

## 2013-04-02 ENCOUNTER — Telehealth: Payer: Self-pay | Admitting: Emergency Medicine

## 2013-04-02 DIAGNOSIS — F419 Anxiety disorder, unspecified: Secondary | ICD-10-CM

## 2013-04-02 MED ORDER — CLONAZEPAM 0.5 MG PO TABS: 0.5000 mg | ORAL_TABLET | Freq: Two times a day (BID) | ORAL | Status: DC | PRN

## 2013-04-02 NOTE — Telephone Encounter (Signed)
I will call in 1 month of clonazepam as we discussed this at her annual exam in November and she was due for refill after December.  She will need an OV before additional refills will be given.

## 2013-04-02 NOTE — Telephone Encounter (Signed)
Will fwd. To PCP for review. Last OV 02/01/13. Do not see upcoming OV. Lorenda Hatchet.Kiylee Thoreson, Renato Battleshekla

## 2013-04-02 NOTE — Telephone Encounter (Signed)
Refill request for clonazePAM. Patient to make an appt next week.

## 2013-04-02 NOTE — Telephone Encounter (Signed)
Called pt and informed. Pt reports that she does not have a job at this moment and she appreciates the Rx. Thank you. Lorenda Hatchet.Tylon Kemmerling, Renato Battleshekla

## 2013-04-06 ENCOUNTER — Encounter: Payer: Self-pay | Admitting: Family Medicine

## 2013-04-06 ENCOUNTER — Ambulatory Visit (INDEPENDENT_AMBULATORY_CARE_PROVIDER_SITE_OTHER): Payer: Medicaid Other | Admitting: Family Medicine

## 2013-04-06 VITALS — BP 125/86 | HR 85 | Temp 98.3°F | Ht 63.0 in | Wt 174.2 lb

## 2013-04-06 DIAGNOSIS — R51 Headache: Secondary | ICD-10-CM

## 2013-04-06 MED ORDER — NAPROXEN 500 MG PO TABS: 500.0000 mg | ORAL_TABLET | Freq: Two times a day (BID) | ORAL | Status: DC

## 2013-04-06 NOTE — Patient Instructions (Signed)
Dear Heidi [name], Thank you for coming in to clinic today. It was good to meet you!  Today we discussed your Headaches. 1. I believe that your headaches are most likely "Tension Headaches" and "Medication Overdose/Rebound Headaches", worsened by stress / anxiety, but ultimately I believe that they are medicine / musculoskeletal related or at least this is making them worse. I do not believe that you have Migraine headaches. 2. Goal is to get you completely off of the Excedrin-Migraine. I recommend a quick taper off, starting today, I would start with taking up to 4 today, then limit it to 2-3 tomorrow, and then 1-2 on Sunday. By Monday I would recommend being completely off of Excedrin-Migraine. (The caffeine is the main cause of rebound headaches, so avoid all caffeine for now). 3. Start Naproxen (Naprosyn) 500mg  take 2 every 12 hours (or twice daily) starting today, and continue this regularly for 2-4 weeks. You may discuss this with Dr. Piedad ClimesHonig when you see her. 4. Remember do all of the non-medical therapies as well! Cold rag, or try hot wash cloth, massage techniques demonstrated today will help your sinuses and your neck muscles. I do not believe your sinuses are infected today, but if you continue to worsen we may explore this option.  Some important numbers from today's visit: BP - 125/86  Please schedule a follow-up appointment with Dr. Piedad ClimesHonig or myself in 1-2 weeks for re-evaluation of your headaches. Please continue to follow-up with Dr. Piedad ClimesHonig as needed.  If you have any other questions or concerns, please feel free to call the clinic to contact me. You may also schedule an earlier appointment if necessary.  However, if your symptoms get significantly worse, please go to the Emergency Department to seek immediate medical attention.  Saralyn PilarAlexander Jamori Biggar, DO [facility]

## 2013-04-06 NOTE — Progress Notes (Signed)
Subjective:     Patient ID: Heidi [name], female   DOB: 03-30-1980, 33 y.o.   MRN: [id number]  HPI  HEADACHES: Reports chronic h/o headaches intermittently for >6350yr. Currently complains of worsening headaches daily for past 2 weeks, bilateral (R>L) starting in back of head and radiating to front and both eyes, with "temples pulsating", severe 8/10 at worst and lowest 5-6/10, worse at end of day or stress/anxiety, also reports h/o intermittent sharp pain anterior neck and some radiating pain down into Left arm (chronic x 1 year), but denies specific association to headaches. -Treatment with Excedrin-Migraine for past several months, recently increased to 4-6 tablets total daily with 1 tab q 4-6 hours. Note this gives relief, but she feels like her "body is getting used to it", and it is "less effective". - Denies any significant associated symptoms - Denies any caffeine use (no coffee, tea, sodas) - Reports h/o of sinus infection in Fall 2014 (treated with antibiotics) since resolved  Social Hx: Former smoker  Review of Systems  See above HPI, additionally:  Admits to post-nasal drip, some sinus congestion. Denies fever, chills, recent illnesses, vision changes, CP or tightness, dyspnea, cough, numbness, tingling, weakness, abd pain, nausea / vomiting, constipation / diarrhea.     Objective:   Physical Exam  BP 125/86  Pulse 85  Temp(Src) 98.3 F (36.8 C) (Oral)  Ht 5\' 3"  (1.6 m)  Wt 174 lb 3.2 oz (79.017 kg)  BMI 30.87 kg/m2  Gen - pleasant, well-appearing, NAD HEENT - NCAT, PERRL, EOMI, b/l frontal facial tenderness with minimal maxillary tenderness, nares with b/l edematous turbinates w/o congestion, oropharynx clear, MMM Neck - supple, non-tender, no LAD, FROM Heart - RRR, no murmurs heard Lungs - CTAB, no wheezing, crackles, or rhonchi. Normal work of breathing. Abd - soft, NTND, no masses, +active BS Ext - non-tender, no edema, peripheral pulses intact +2 b/l Skin -  warm, dry, no rashes Neuro - awake, alert, oriented, grossly non-focal, intact muscle strength 5/5 b/l, intact distal sensation to light touch, gait normal  OMT: Paracervical muscle spasms bilaterally at base of occiput (C1-C2)     Assessment:     See specific A&P problem list for details.      Plan:     See specific A&P problem list for details.

## 2013-04-06 NOTE — Assessment & Plan Note (Addendum)
Suspect acute on chronic headaches are multifactorial, primarily tension headaches worsened by medication overuse/rebound (excedrin-migraine with caffeine). History/clinically reassuring without findings of laterality, neurological changes, or any headache red flags. - Unlikely migraine headaches - Possible involvement of sinuses, given last sinus infection < 6 months ago. However, unlikely chronic sinusitis given normal Head CT in 2013.  Plan: 1. Performed OMT with significant relief (Sinus effleurage, Suboccipital Myofascial Release), discussed, advised of potential rebound pain, consented, tolerated techniques well with significant improvement. 2. Start Naproxen 500mg  take 1 tab BID with food, take for 2 to 4 weeks. 3. 3 day taper off of Excedrin-Migraine (4 x today, 2 x Saturday, 1 x Sunday, off by Monday). Cautioned that HA may acutely get worse. Avoid all caffeine products for at least 2 weeks 4. Take existing Flexeril 10mg  PRN, advised to take it regularly at bedtime for 3 nights to 1 week 5. Conservative measures - cold / hot compresses PRN, sinus massage techniques. 6. Decline use of Flonase 7. RTC 1-2 weeks if not improved. Consider sinusitis involvement, re-eval may benefit from course Augmentin

## 2013-04-18 ENCOUNTER — Encounter: Payer: Self-pay | Admitting: Emergency Medicine

## 2013-04-18 ENCOUNTER — Ambulatory Visit (INDEPENDENT_AMBULATORY_CARE_PROVIDER_SITE_OTHER): Payer: Medicaid Other | Admitting: Emergency Medicine

## 2013-04-18 ENCOUNTER — Other Ambulatory Visit: Payer: Self-pay | Admitting: Emergency Medicine

## 2013-04-18 VITALS — BP 121/81 | HR 66 | Temp 98.1°F | Ht 64.0 in | Wt 176.0 lb

## 2013-04-18 DIAGNOSIS — N644 Mastodynia: Secondary | ICD-10-CM

## 2013-04-18 DIAGNOSIS — N6315 Unspecified lump in the right breast, overlapping quadrants: Secondary | ICD-10-CM

## 2013-04-18 DIAGNOSIS — N6310 Unspecified lump in the right breast, unspecified quadrant: Secondary | ICD-10-CM

## 2013-04-18 NOTE — Assessment & Plan Note (Addendum)
I suspect fibrocystic changes. Given the degree of tenderness and lack of fluctuation with menstrual cycle, will check ultrasound of the right breast looking for fluid collection. I will call her with the results. Tylenol or motrin prn for pain.

## 2013-04-18 NOTE — Progress Notes (Signed)
   Subjective:    Patient ID: Heidi [name], female    DOB: 1980-04-03, 33 y.o.   MRN: [id number]  HPI Heidi [name] is here for a SDA for right breast pain.  She states the pain has been present for about 2-3 weeks.  She had a period during this time that did not affect the pain at all.  She denies any fevers or skin changes.  Reports some clear nipple discharge at times.  Does have night sweats.  No weight loss.  She has a history of recurrent breast cysts, but this is different.   Family history has multiple cancers on her dad sides, mostly after age 33.  Paternal aunt recently diagnosed with stage IV breast cancer in her 4650s.   Current Outpatient Prescriptions on File Prior to Visit  Medication Sig Dispense Refill  . albuterol (PROVENTIL HFA;VENTOLIN HFA) 108 (90 BASE) MCG/ACT inhaler Inhale 2 puffs into the lungs every 6 (six) hours as needed for wheezing (cough).  1 Inhaler  5  . aspirin-acetaminophen-caffeine (EXCEDRIN MIGRAINE) 250-250-65 MG per tablet Take 2 tablets by mouth every 3 (three) hours as needed. For headache      . cephALEXin (KEFLEX) 500 MG capsule Take 1 capsule (500 mg total) by mouth 4 (four) times daily.  28 capsule  0  . cetirizine (ZYRTEC) 10 MG tablet Take 1 tablet (10 mg total) by mouth daily.  30 tablet  11  . clonazePAM (KLONOPIN) 0.5 MG tablet Take 1 tablet (0.5 mg total) by mouth 2 (two) times daily as needed for anxiety. For anxiety  60 tablet  0  . cyclobenzaprine (FLEXERIL) 10 MG tablet Take 1 tablet (10 mg total) by mouth 2 (two) times daily as needed for muscle spasms.  30 tablet  3  . fluconazole (DIFLUCAN) 150 MG tablet Take 1 tablet (150 mg total) by mouth once. Take after completing the antibiotics  1 tablet  0  . fluticasone (FLONASE) 50 MCG/ACT nasal spray Place 2 sprays into the nose daily as needed. For dry nasal passages      . Multiple Vitamins-Minerals (MULTIVITAMINS THER. W/MINERALS) TABS Take 1 tablet by mouth daily.        . naproxen  (NAPROSYN) 500 MG tablet Take 1 tablet (500 mg total) by mouth 2 (two) times daily with a meal.  30 tablet  1   No current facility-administered medications on file prior to visit.    I have reviewed and updated the following as appropriate: allergies and current medications SHx: former smoker  Health Maintenance: pap due August 2015   Review of Systems See HPI    Objective:   Physical Exam BP 121/81  Pulse 66  Temp(Src) 98.1 F (36.7 C) (Oral)  Ht 5\' 4"  (1.626 m)  Wt 176 lb (79.833 kg)  BMI 30.20 kg/m2  LMP 03/31/2013 Gen: alert, cooperative, NAD Breast: breast are symmetric; no dimpling, erythema, or other skin changes noted; breast exam notable for tender, mobile fibrous mass in right breast at 9 o'clock; some fullness in right axilla but no focal axillary LAD     Assessment & Plan:

## 2013-04-18 NOTE — Patient Instructions (Signed)
It was nice to see you!  You do have a tender lump in your breast.  It doesn't feel like anything worrisome, but I want to get an ultrasound just in case.  I will call you with the results of the ultrasound.  You are due for a pap smear in August of this year.  Follow up as needed.

## 2013-04-23 ENCOUNTER — Other Ambulatory Visit: Payer: Medicaid Other

## 2013-04-26 ENCOUNTER — Ambulatory Visit
Admission: RE | Admit: 2013-04-26 | Discharge: 2013-04-26 | Disposition: A | Discharge status: Home / Self Care | Payer: Medicaid Other | Source: Ambulatory Visit | Attending: Family Medicine | Admitting: Family Medicine

## 2013-04-26 DIAGNOSIS — N6310 Unspecified lump in the right breast, unspecified quadrant: Secondary | ICD-10-CM

## 2013-04-26 DIAGNOSIS — N644 Mastodynia: Secondary | ICD-10-CM

## 2013-04-26 DIAGNOSIS — N6315 Unspecified lump in the right breast, overlapping quadrants: Secondary | ICD-10-CM

## 2013-05-26 ENCOUNTER — Telehealth: Payer: Self-pay | Admitting: Family Medicine

## 2013-05-26 NOTE — Telephone Encounter (Signed)
After hours line  Pt calling with a "little headache" that has lasted all day. She describes a R sided HA described as dull and aching. She is wondering what has caused it because its making her anxious. She denies any n/v, GI upset, and states that it is not even close to the worse HA of her life. Excedrin helped some.  I reassured her and encouraged her to continue taking excedrin per the package recommendations.   I encouraged her that if it is still persistent and really bothering her later today that [facility]   Murtis SinkSam Eliah Ozawa, MD [facility] Resident, PGY-2 05/26/2013, 3:05 AM

## 2013-06-12 ENCOUNTER — Telehealth: Payer: Self-pay | Admitting: Emergency Medicine

## 2013-06-12 NOTE — Telephone Encounter (Signed)
Will fwd to PCP for review. Thanks. .Sabryn Preslar  

## 2013-06-12 NOTE — Telephone Encounter (Signed)
Refill request for clonazePAM  

## 2013-06-14 NOTE — Telephone Encounter (Signed)
She will need an appointment since clonazepam is a controlled substance.

## 2013-06-14 NOTE — Telephone Encounter (Signed)
Called pt. LMVM to call back. Please tell pt, she needs OV for RF. See message below. Thanks. Lorenda Hatchet.Zylpha Poynor, Renato Battleshekla

## 2013-06-14 NOTE — Telephone Encounter (Signed)
Pt notified.  Aza Dantes L, CMA  

## 2013-06-15 ENCOUNTER — Encounter: Payer: Self-pay | Admitting: Family Medicine

## 2013-06-15 ENCOUNTER — Ambulatory Visit (INDEPENDENT_AMBULATORY_CARE_PROVIDER_SITE_OTHER): Payer: Medicaid Other | Admitting: Family Medicine

## 2013-06-15 VITALS — BP 114/76 | HR 65 | Ht 64.0 in | Wt 179.0 lb

## 2013-06-15 DIAGNOSIS — F419 Anxiety disorder, unspecified: Secondary | ICD-10-CM

## 2013-06-15 DIAGNOSIS — F411 Generalized anxiety disorder: Secondary | ICD-10-CM

## 2013-06-15 MED ORDER — CLONAZEPAM 0.5 MG PO TABS: 0.5000 mg | ORAL_TABLET | Freq: Two times a day (BID) | ORAL | Status: DC | PRN

## 2013-06-15 NOTE — Patient Instructions (Signed)
Ms. Marlyne BeardsJennings,   It was nice to see you. I hope that this helps with your anxiety. Please follow up with Dr. Piedad ClimesHonig within the next 2 weeks.   Sincerely,   Dr. Clinton SawyerWilliamson

## 2013-06-15 NOTE — Progress Notes (Signed)
   Subjective:    Patient ID: Heidi [name], female    DOB: January 21, 1981, 33 y.o.   MRN: [id number]  HPI  33 year old F with Bipolar disease, anxiety and depression. She presents today for follow up of anxiety. She notes that she feels like her anxiety has increased due to difficulty with poor credit and new employment. She notes that she is taking it twice a day for the past week. She is also going to a support group with [employer]. This helps with her stress.   Review of Systems C. atria    Objective:   Physical Exam BP 114/76  Pulse 65  Ht 5\' 4"  (1.626 m)  Wt 179 lb (81.194 kg)  BMI 30.71 kg/m2 Gen: Young female, nondistressed a well-appearing Psychiatric: normal affect, thought content and speech pattern       Assessment & Plan:

## 2013-06-17 NOTE — Assessment & Plan Note (Signed)
Stable, refilled benzos, will f/u with PCP

## 2013-06-19 ENCOUNTER — Emergency Department (HOSPITAL_COMMUNITY)
Admission: EM | Admit: 2013-06-19 | Discharge: 2013-06-19 | Disposition: A | Discharge status: Home / Self Care | Payer: Medicaid Other | Attending: Emergency Medicine | Admitting: Emergency Medicine

## 2013-06-19 ENCOUNTER — Encounter (HOSPITAL_COMMUNITY): Payer: Self-pay | Admitting: Emergency Medicine

## 2013-06-19 DIAGNOSIS — Z91040 Latex allergy status: Secondary | ICD-10-CM | POA: Insufficient documentation

## 2013-06-19 DIAGNOSIS — J029 Acute pharyngitis, unspecified: Secondary | ICD-10-CM

## 2013-06-19 DIAGNOSIS — F3289 Other specified depressive episodes: Secondary | ICD-10-CM | POA: Insufficient documentation

## 2013-06-19 DIAGNOSIS — Z8751 Personal history of pre-term labor: Secondary | ICD-10-CM | POA: Insufficient documentation

## 2013-06-19 DIAGNOSIS — Z791 Long term (current) use of non-steroidal anti-inflammatories (NSAID): Secondary | ICD-10-CM | POA: Insufficient documentation

## 2013-06-19 DIAGNOSIS — Z79899 Other long term (current) drug therapy: Secondary | ICD-10-CM | POA: Insufficient documentation

## 2013-06-19 DIAGNOSIS — IMO0002 Reserved for concepts with insufficient information to code with codable children: Secondary | ICD-10-CM | POA: Insufficient documentation

## 2013-06-19 DIAGNOSIS — F411 Generalized anxiety disorder: Secondary | ICD-10-CM | POA: Insufficient documentation

## 2013-06-19 DIAGNOSIS — Z87891 Personal history of nicotine dependence: Secondary | ICD-10-CM | POA: Insufficient documentation

## 2013-06-19 DIAGNOSIS — F329 Major depressive disorder, single episode, unspecified: Secondary | ICD-10-CM | POA: Insufficient documentation

## 2013-06-19 LAB — RAPID STREP SCREEN (MED CTR MEBANE ONLY): STREPTOCOCCUS, GROUP A SCREEN (DIRECT): NEGATIVE

## 2013-06-19 MED ORDER — NAPROXEN 500 MG PO TABS: 500.0000 mg | ORAL_TABLET | Freq: Two times a day (BID) | ORAL | Status: DC

## 2013-06-19 MED ORDER — PENICILLIN V POTASSIUM 500 MG PO TABS: 500.0000 mg | ORAL_TABLET | Freq: Four times a day (QID) | ORAL | Status: DC

## 2013-06-19 NOTE — Discharge Instructions (Signed)
Take penicillin as directed. As we discussed deficit strep related you will be better in a couple days. Also if you get better and then get worse again he need to be seen. It is possible this could be a viral pharyngitis. Also take the Naprosyn as directed. This will help with inflammation and the throat pain. Work note provided.
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
## 2013-06-19 NOTE — ED Notes (Signed)
Pt c/o sore throat x 2 days; runny nose; history of strep

## 2013-06-21 ENCOUNTER — Telehealth: Payer: Self-pay | Admitting: Emergency Medicine

## 2013-06-21 LAB — CULTURE, GROUP A STREP

## 2013-06-21 NOTE — Telephone Encounter (Signed)
Pt called because [facility]. She still has a headache and was not sure if this is a side effect from having a head cold. She is taking Tylenol, but it is not helping. She would like the nurse to call her and advise on what she can do. JW

## 2013-06-26 ENCOUNTER — Encounter: Payer: Self-pay | Admitting: Emergency Medicine

## 2013-06-26 ENCOUNTER — Ambulatory Visit (INDEPENDENT_AMBULATORY_CARE_PROVIDER_SITE_OTHER): Payer: Medicaid Other | Admitting: Emergency Medicine

## 2013-06-26 VITALS — BP 117/69 | HR 73 | Temp 98.1°F | Ht 63.0 in | Wt 179.0 lb

## 2013-06-26 DIAGNOSIS — F411 Generalized anxiety disorder: Secondary | ICD-10-CM

## 2013-06-26 DIAGNOSIS — F419 Anxiety disorder, unspecified: Secondary | ICD-10-CM

## 2013-06-26 MED ORDER — FLUCONAZOLE 150 MG PO TABS: 150.0000 mg | ORAL_TABLET | Freq: Every day | ORAL | Status: DC

## 2013-06-26 MED ORDER — CLONAZEPAM 1 MG PO TABS: 1.0000 mg | ORAL_TABLET | Freq: Two times a day (BID) | ORAL | Status: DC | PRN

## 2013-06-26 NOTE — Progress Notes (Signed)
   Subjective:    Patient ID: Heidi [name], female    DOB: 1981-01-23, 33 y.o.   MRN: [id number]  HPI Heidi Chamberlainameka N Koehne is here for anxiety f/u.  She reports worsening of her anxiety over the last month or so.  She states it is almost completely from financial stress.  She has a job she really likes at [employer], but she is only working 15-20 hours a week.  She feels like she is not where she is supposed to be.  Her relationship is doing well as is her relationship with her daughter.  She is worried about her mother and her mother's weight.  Cross fit remains a good stress reliever for her.  Current Outpatient Prescriptions on File Prior to Visit  Medication Sig Dispense Refill  . albuterol (PROVENTIL HFA;VENTOLIN HFA) 108 (90 BASE) MCG/ACT inhaler Inhale 2 puffs into the lungs every 6 (six) hours as needed for wheezing (cough).  1 Inhaler  5  . aspirin-acetaminophen-caffeine (EXCEDRIN MIGRAINE) 250-250-65 MG per tablet Take 2 tablets by mouth every 3 (three) hours as needed. For headache      . cetirizine (ZYRTEC) 10 MG tablet Take 1 tablet (10 mg total) by mouth daily.  30 tablet  11  . Chlorphen-Pseudoephed-APAP (THERAFLU FLU/COLD PO) Take 1 packet by mouth every 4 (four) hours as needed (cold/flu).      . cyclobenzaprine (FLEXERIL) 10 MG tablet Take 1 tablet (10 mg total) by mouth 2 (two) times daily as needed for muscle spasms.  30 tablet  3  . naproxen (NAPROSYN) 500 MG tablet Take 1 tablet (500 mg total) by mouth 2 (two) times daily with a meal.  30 tablet  1  . naproxen (NAPROSYN) 500 MG tablet Take 1 tablet (500 mg total) by mouth 2 (two) times daily.  14 tablet  0  . penicillin v potassium (VEETID) 500 MG tablet Take 1 tablet (500 mg total) by mouth 4 (four) times daily.  40 tablet  0   No current facility-administered medications on file prior to visit.    I have reviewed and updated the following as appropriate: allergies and current medications SHx: former smoker  Health  Maintenance: pap due 10/2013   Review of Systems See HPI    Objective:   Physical Exam BP 117/69  Pulse 73  Temp(Src) 98.1 F (36.7 C) (Oral)  Ht 5\' 3"  (1.6 m)  Wt 179 lb (81.194 kg)  BMI 31.72 kg/m2  LMP 06/26/2013 Gen: alert, cooperative, NAD, intermittently tearful     Assessment & Plan:  Discuss pap smear with patient at next visit as she may be more comfortable with me doing it in May or June instead of with a new physician.

## 2013-06-26 NOTE — Patient Instructions (Signed)
It was nice to see you!  We are going to increase your Klonopin a little.   I want you to take 1mg  twice a day.  I will see you back in 1 month to see how you're doing and check some blood work.

## 2013-06-26 NOTE — Assessment & Plan Note (Signed)
Can identify positives, but her financials are a source of anxiety and stress. Also anxious about potential STDs although reports relationship as excellent.  After discussion, will check HIV, RPR, HSV, GC/Chlamydia and LFTs at next visit. Will increase Klonopin to 1mg  BID prn for now. F/u in 1 month.

## 2013-06-27 NOTE — Telephone Encounter (Signed)
Called and spoke with patient.  She states that she already received call from someone addressing her concerns.  Altamese Dilling~Hao Dion, BSN, RN-BC

## 2013-07-12 ENCOUNTER — Ambulatory Visit (INDEPENDENT_AMBULATORY_CARE_PROVIDER_SITE_OTHER): Payer: Medicaid Other | Admitting: Emergency Medicine

## 2013-07-12 ENCOUNTER — Encounter: Payer: Self-pay | Admitting: Emergency Medicine

## 2013-07-12 VITALS — BP 105/72 | HR 72 | Temp 98.2°F | Ht 64.0 in | Wt 182.0 lb

## 2013-07-12 DIAGNOSIS — Z113 Encounter for screening for infections with a predominantly sexual mode of transmission: Secondary | ICD-10-CM

## 2013-07-12 LAB — COMPLETE METABOLIC PANEL WITH GFR
ALT: 8 U/L (ref 0–35)
AST: 14 U/L (ref 0–37)
Albumin: 4 g/dL (ref 3.5–5.2)
Alkaline Phosphatase: 49 U/L (ref 39–117)
BILIRUBIN TOTAL: 0.3 mg/dL (ref 0.2–1.2)
BUN: 13 mg/dL (ref 6–23)
CO2: 25 meq/L (ref 19–32)
CREATININE: 0.78 mg/dL (ref 0.50–1.10)
Calcium: 8.8 mg/dL (ref 8.4–10.5)
Chloride: 108 mEq/L (ref 96–112)
GFR, Est Non African American: >89 mL/min
Glucose, Bld: 92 mg/dL (ref 70–99)
Potassium: 4 mEq/L (ref 3.5–5.3)
Sodium: 140 mEq/L (ref 135–145)
Total Protein: 6.3 g/dL (ref 6.0–8.3)

## 2013-07-12 LAB — LIPID PANEL
Cholesterol: 160 mg/dL (ref 0–200)
HDL: 46 mg/dL (ref >39)
LDL Cholesterol: 94 mg/dL (ref 0–99)
Total CHOL/HDL Ratio: 3.5 Ratio
Triglycerides: 99 mg/dL (ref <150)
VLDL: 20 mg/dL (ref 0–40)

## 2013-07-12 NOTE — Patient Instructions (Signed)
It was nice to see you!  I will call you if anything is wrong on the blood work.  If you have any questions on it, please call me.  I will plan on seeing you in May for a check in and again in June for a pap smear.  If you are doing great, you can skip the May appointment.  And you can always come see me if needed.
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
## 2013-07-13 ENCOUNTER — Telehealth: Payer: Self-pay | Admitting: Emergency Medicine

## 2013-07-13 LAB — HIV ANTIBODY (ROUTINE TESTING W REFLEX): HIV 1&2 Ab, 4th Generation: NONREACTIVE

## 2013-07-13 LAB — RPR

## 2013-07-13 LAB — HSV 2 ANTIBODY, IGG: HSV 2 Glycoprotein G Ab, IgG: 6.91 IV — ABNORMAL HIGH

## 2013-07-13 LAB — HSV 1 ANTIBODY, IGG: HSV 1 Glycoprotein G Ab, IgG: 0.11 IV

## 2013-07-13 NOTE — Telephone Encounter (Signed)
Called and left message at home number.  Requested that she call our office and let me know what would be a good time for me to call her.  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  Unless she is having symptoms, we do not need to do anything about it.

## 2013-07-13 NOTE — Telephone Encounter (Signed)
Pt would be available to talk at 6:15

## 2013-07-16 NOTE — Telephone Encounter (Signed)
I spoke with the patient Friday evening.  Reviewed her lab results with her and answered all her questions.  I have also updated her emergency contact to her mother, Almyra BraceLillie Mae at [phone].

## 2013-08-08 ENCOUNTER — Encounter: Payer: Medicaid Other | Admitting: Emergency Medicine

## 2013-08-16 ENCOUNTER — Ambulatory Visit (INDEPENDENT_AMBULATORY_CARE_PROVIDER_SITE_OTHER): Payer: Medicaid Other | Admitting: Emergency Medicine

## 2013-08-16 ENCOUNTER — Encounter: Payer: Self-pay | Admitting: Emergency Medicine

## 2013-08-16 ENCOUNTER — Other Ambulatory Visit (HOSPITAL_COMMUNITY)
Admission: RE | Admit: 2013-08-16 | Discharge: 2013-08-16 | Disposition: A | Discharge status: Home / Self Care | Payer: Medicaid Other | Source: Ambulatory Visit | Attending: Emergency Medicine | Admitting: Emergency Medicine

## 2013-08-16 VITALS — BP 111/75 | HR 76 | Ht 64.0 in | Wt 179.0 lb

## 2013-08-16 DIAGNOSIS — Z124 Encounter for screening for malignant neoplasm of cervix: Secondary | ICD-10-CM

## 2013-08-16 DIAGNOSIS — M79605 Pain in left leg: Secondary | ICD-10-CM | POA: Insufficient documentation

## 2013-08-16 DIAGNOSIS — M79609 Pain in unspecified limb: Secondary | ICD-10-CM

## 2013-08-16 DIAGNOSIS — Z1151 Encounter for screening for human papillomavirus (HPV): Secondary | ICD-10-CM | POA: Insufficient documentation

## 2013-08-16 DIAGNOSIS — Z01419 Encounter for gynecological examination (general) (routine) without abnormal findings: Secondary | ICD-10-CM

## 2013-08-16 MED ORDER — POTASSIUM CHLORIDE ER 20 MEQ PO TBCR: 20.0000 meq | EXTENDED_RELEASE_TABLET | Freq: Every day | ORAL | Status: DC

## 2013-08-16 MED ORDER — TRAMADOL HCL 50 MG PO TABS: 50.0000 mg | ORAL_TABLET | Freq: Three times a day (TID) | ORAL | Status: DC | PRN

## 2013-08-16 NOTE — Progress Notes (Signed)
   Subjective:    Patient ID: Heidi [name], female    DOB: 04/21/1980, 33 y.o.   MRN: [id number]  HPI Heidi [name] is here for Pap smear.  Patient here for routine exam. Current complaints: none. No vaginal discharge or itching.  She has a history of abnormal pap smear back in 2005 at which time she had a colopscopy.   Gynecologic History Patient's last menstrual period was 08/08/2013. Contraception: lesbian Last Pap: 10/2010. Results were: normal Last mammogram: n/a.   Left leg pain Reports intermittent leg cramping at night. This occurs in her left leg. It has been a little worse the last few days. Denies any swelling. She works at [employer], where she is on her feet all day.   Current Outpatient Prescriptions on File Prior to Visit  Medication Sig Dispense Refill  . albuterol (PROVENTIL HFA;VENTOLIN HFA) 108 (90 BASE) MCG/ACT inhaler Inhale 2 puffs into the lungs every 6 (six) hours as needed for wheezing (cough).  1 Inhaler  5  . aspirin-acetaminophen-caffeine (EXCEDRIN MIGRAINE) 250-250-65 MG per tablet Take 2 tablets by mouth every 3 (three) hours as needed. For headache      . cetirizine (ZYRTEC) 10 MG tablet Take 1 tablet (10 mg total) by mouth daily.  30 tablet  11  . Chlorphen-Pseudoephed-APAP (THERAFLU FLU/COLD PO) Take 1 packet by mouth every 4 (four) hours as needed (cold/flu).      . clonazePAM (KLONOPIN) 1 MG tablet Take 1 tablet (1 mg total) by mouth 2 (two) times daily as needed for anxiety. For anxiety  60 tablet  3  . cyclobenzaprine (FLEXERIL) 10 MG tablet Take 1 tablet (10 mg total) by mouth 2 (two) times daily as needed for muscle spasms.  30 tablet  3  . fluconazole (DIFLUCAN) 150 MG tablet Take 1 tablet (150 mg total) by mouth daily.  1 tablet  3  . naproxen (NAPROSYN) 500 MG tablet Take 1 tablet (500 mg total) by mouth 2 (two) times daily with a meal.  30 tablet  1  . naproxen (NAPROSYN) 500 MG tablet Take 1 tablet (500 mg total) by mouth 2 (two) times  daily.  14 tablet  0  . penicillin v potassium (VEETID) 500 MG tablet Take 1 tablet (500 mg total) by mouth 4 (four) times daily.  40 tablet  0   No current facility-administered medications on file prior to visit.    I have reviewed and updated the following as appropriate: allergies, current medications, past family history, past medical history, past social history, past surgical history and problem list SHx: former smoker   Review of Systems See HPI    Objective:   Physical Exam BP 111/75  Pulse 76  Ht 5\' 4"  (1.626 m)  Wt 179 lb (81.194 kg)  BMI 30.71 kg/m2  LMP 08/08/2013 Gen: alert, cooperative, NAD Left leg: no swelling or erythema; no point tenderness Pelvic: normal external genitalia; normal vagina; normal cervix; pap collected      Assessment & Plan:

## 2013-08-16 NOTE — Assessment & Plan Note (Signed)
Likely cramps. Discussed stretching. Tramadol provided to help with acute pain. Trial of KDur to see if that helps. F/u in 1 month.

## 2013-08-16 NOTE — Patient Instructions (Signed)
It was nice to see you!  I think the leg pain you are having is cramps. Stretch your calf at least once a day. Take the potassium pill once a day. Use tramadol as needed for severe pain.  I will send you a letter with the results of your pap smear.  If something is wrong, I will call you.  Follow up in 1 month.

## 2013-08-18 ENCOUNTER — Telehealth: Payer: Self-pay | Admitting: Family Medicine

## 2013-08-18 NOTE — Telephone Encounter (Signed)
After hours line  Pt calling stating that she pulled a muscle in her back earlier today when she was reaching for her nephew. She has had severe pulling deep pain with movement since then, She denies an actual fall. She states she has diffuse tenderness to palpation of her low back. She has not tried any meds, ice, or heat. She deny's bowel or bladder incontinence and weakness/numbness in her LE.   I advised her to take 800 mg advil Q8 hours, try heat via water bottle or heat pad, and get seen if her pain is not helped or worsened. Reviewed red flags in detail which she understands. Advised UC tomorrow if pain is unbearable and to com e in to clinic on Monday if she is till concerned then.   Murtis Sink, MD [facility] Resident, PGY-2 08/18/2013, 8:34 PM

## 2013-08-19 ENCOUNTER — Encounter (HOSPITAL_BASED_OUTPATIENT_CLINIC_OR_DEPARTMENT_OTHER): Payer: Self-pay | Admitting: Emergency Medicine

## 2013-08-19 ENCOUNTER — Emergency Department (HOSPITAL_BASED_OUTPATIENT_CLINIC_OR_DEPARTMENT_OTHER)
Admission: EM | Admit: 2013-08-19 | Discharge: 2013-08-19 | Disposition: A | Discharge status: Home / Self Care | Payer: Medicaid Other | Attending: Emergency Medicine | Admitting: Emergency Medicine

## 2013-08-19 DIAGNOSIS — M62838 Other muscle spasm: Secondary | ICD-10-CM

## 2013-08-19 DIAGNOSIS — F411 Generalized anxiety disorder: Secondary | ICD-10-CM | POA: Insufficient documentation

## 2013-08-19 DIAGNOSIS — X500XXA Overexertion from strenuous movement or load, initial encounter: Secondary | ICD-10-CM | POA: Insufficient documentation

## 2013-08-19 DIAGNOSIS — Z79899 Other long term (current) drug therapy: Secondary | ICD-10-CM | POA: Insufficient documentation

## 2013-08-19 DIAGNOSIS — F3289 Other specified depressive episodes: Secondary | ICD-10-CM | POA: Insufficient documentation

## 2013-08-19 DIAGNOSIS — S335XXA Sprain of ligaments of lumbar spine, initial encounter: Secondary | ICD-10-CM | POA: Insufficient documentation

## 2013-08-19 DIAGNOSIS — F329 Major depressive disorder, single episode, unspecified: Secondary | ICD-10-CM | POA: Insufficient documentation

## 2013-08-19 DIAGNOSIS — Z791 Long term (current) use of non-steroidal anti-inflammatories (NSAID): Secondary | ICD-10-CM | POA: Insufficient documentation

## 2013-08-19 DIAGNOSIS — Z792 Long term (current) use of antibiotics: Secondary | ICD-10-CM | POA: Insufficient documentation

## 2013-08-19 DIAGNOSIS — M549 Dorsalgia, unspecified: Secondary | ICD-10-CM

## 2013-08-19 DIAGNOSIS — Z8742 Personal history of other diseases of the female genital tract: Secondary | ICD-10-CM | POA: Insufficient documentation

## 2013-08-19 DIAGNOSIS — Y9389 Activity, other specified: Secondary | ICD-10-CM | POA: Insufficient documentation

## 2013-08-19 DIAGNOSIS — Z87891 Personal history of nicotine dependence: Secondary | ICD-10-CM | POA: Insufficient documentation

## 2013-08-19 DIAGNOSIS — Y929 Unspecified place or not applicable: Secondary | ICD-10-CM | POA: Insufficient documentation

## 2013-08-19 MED ORDER — CYCLOBENZAPRINE HCL 5 MG PO TABS: 5.0000 mg | ORAL_TABLET | Freq: Three times a day (TID) | ORAL | Status: DC | PRN

## 2013-08-19 MED ORDER — IBUPROFEN 800 MG PO TABS: 800.0000 mg | ORAL_TABLET | Freq: Three times a day (TID) | ORAL | Status: DC | PRN

## 2013-08-19 MED ORDER — HYDROCODONE-ACETAMINOPHEN 5-325 MG PO TABS: 1.0000 | ORAL_TABLET | ORAL | Status: DC | PRN

## 2013-08-19 NOTE — ED Notes (Signed)
Pt reports yesterday was chasing after children and reached out to grab one of them and since this time with pain in lower lumbar area and right trapezius area.  Denies radiation down legs or n/t.
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
## 2013-08-19 NOTE — Discharge Instructions (Signed)
Back Exercises Back exercises help treat and prevent back injuries. The goal of back exercises is to increase the strength of your abdominal and back muscles and the flexibility of your back. These exercises should be started when you no longer have back pain. Back exercises include:  Pelvic Tilt. Lie on your back with your knees bent. Tilt your pelvis until the lower part of your back is against the floor. Hold this position 5 to 10 sec and repeat 5 to 10 times.  Knee to Chest. Pull first 1 knee up against your chest and hold for 20 to 30 seconds, repeat this with the other knee, and then both knees. This may be done with the other leg straight or bent, whichever feels better.  Sit-Ups or Curl-Ups. Bend your knees 90 degrees. Start with tilting your pelvis, and do a partial, slow sit-up, lifting your trunk only 30 to 45 degrees off the floor. Take at least 2 to 3 seconds for each sit-up. Do not do sit-ups with your knees out straight. If partial sit-ups are difficult, simply do the above but with only tightening your abdominal muscles and holding it as directed.  Hip-Lift. Lie on your back with your knees flexed 90 degrees. Push down with your feet and shoulders as you raise your hips a couple inches off the floor; hold for 10 seconds, repeat 5 to 10 times.  Back arches. Lie on your stomach, propping yourself up on bent elbows. Slowly press on your hands, causing an arch in your low back. Repeat 3 to 5 times. Any initial stiffness and discomfort should lessen with repetition over time.  Shoulder-Lifts. Lie face down with arms beside your body. Keep hips and torso pressed to floor as you slowly lift your head and shoulders off the floor. Do not overdo your exercises, especially in the beginning. Exercises may cause you some mild back discomfort which lasts for a few minutes; however, if the pain is more severe, or lasts for more than 15 minutes, do not continue exercises until you see your caregiver.  Improvement with exercise therapy for back problems is slow.  See your caregivers for assistance with developing a proper back exercise program. Document Released: 04/15/2004 Document Revised: 05/31/2011 Document Reviewed: 01/07/2011 [facility] Patient Information 2014 [place]. Muscle Cramps and Spasms Muscle cramps and spasms occur when a muscle or muscles tighten and you have no control over this tightening (involuntary muscle contraction). They are a common problem and can develop in any muscle. The most common place is in the calf muscles of the leg. Both muscle cramps and muscle spasms are involuntary muscle contractions, but they also have differences:   Muscle cramps are sporadic and painful. They may last a few seconds to a quarter of an hour. Muscle cramps are often more forceful and last longer than muscle spasms.  Muscle spasms may or may not be painful. They may also last just a few seconds or much longer. CAUSES  It is uncommon for cramps or spasms to be due to a serious underlying problem. In many cases, the cause of cramps or spasms is unknown. Some common causes are:   Overexertion.   Overuse from repetitive motions (doing the same thing over and over).   Remaining in a certain position for a long period of time.   Improper preparation, form, or technique while performing a sport or activity.   Dehydration.   Injury.   Side effects of some medicines.   Abnormally low levels of  the salts and ions in your blood (electrolytes), especially potassium and calcium. This could happen if you are taking water pills (diuretics) or you are pregnant.  Some underlying medical problems can make it more likely to develop cramps or spasms. These include, but are not limited to:   Diabetes.   Parkinson disease.   Hormone disorders, such as thyroid problems.   Alcohol abuse.   Diseases specific to muscles, joints, and bones.   Blood vessel disease where not  enough blood is getting to the muscles.  HOME CARE INSTRUCTIONS   Stay well hydrated. Drink enough water and fluids to keep your urine clear or pale yellow.  It may be helpful to massage, stretch, and relax the affected muscle.  For tight or tense muscles, use a warm towel, heating pad, or hot shower water directed to the affected area.  If you are sore or have pain after a cramp or spasm, applying ice to the affected area may relieve discomfort.  Put ice in a plastic bag.  Place a towel between your skin and the bag.  Leave the ice on for 15-20 minutes, 03-04 times a day.  Medicines used to treat a known cause of cramps or spasms may help reduce their frequency or severity. Only take over-the-counter or prescription medicines as directed by your caregiver. SEEK MEDICAL CARE IF:  Your cramps or spasms get more severe, more frequent, or do not improve over time.  MAKE SURE YOU:   Understand these instructions.  Will watch your condition.  Will get help right away if you are not doing well or get worse. Document Released: 08/28/2001 Document Revised: 07/03/2012 Document Reviewed: 02/23/2012 [facility] Patient Information 2014 [place], [place]. Lumbosacral Strain Lumbosacral strain is a strain of any of the parts that make up your lumbosacral vertebrae. Your lumbosacral vertebrae are the bones that make up the lower third of your backbone. Your lumbosacral vertebrae are held together by muscles and tough, fibrous tissue (ligaments).  CAUSES  A sudden blow to your back can cause lumbosacral strain. Also, anything that causes an excessive stretch of the muscles in the low back can cause this strain. This is typically seen when people exert themselves strenuously, fall, lift heavy objects, bend, or crouch repeatedly. RISK FACTORS  Physically demanding work.  Participation in pushing or pulling sports or sports that require sudden twist of the back (tennis, golf, baseball).  Weight  lifting.  Excessive lower back curvature.  Forward-tilted pelvis.  Weak back or abdominal muscles or both.  Tight hamstrings. SIGNS AND SYMPTOMS  Lumbosacral strain may cause pain in the area of your injury or pain that moves (radiates) down your leg.  DIAGNOSIS Your health care provider can often diagnose lumbosacral strain through a physical exam. In some cases, you may need tests such as X-ray exams.  TREATMENT  Treatment for your lower back injury depends on many factors that your clinician will have to evaluate. However, most treatment will include the use of anti-inflammatory medicines. HOME CARE INSTRUCTIONS   Avoid hard physical activities (tennis, racquetball, waterskiing) if you are not in proper physical condition for it. This may aggravate or create problems.  If you have a back problem, avoid sports requiring sudden body movements. Swimming and walking are generally safer activities.  Maintain good posture.  Maintain a healthy weight.  For acute conditions, you may put ice on the injured area.  Put ice in a plastic bag.  Place a towel between your skin and the bag.  Leave the ice on for 20 minutes, 2 3 times a day.  When the low back starts healing, stretching and strengthening exercises may be recommended. SEEK MEDICAL CARE IF:  Your back pain is getting worse.  You experience severe back pain not relieved with medicines. SEEK IMMEDIATE MEDICAL CARE IF:   You have numbness, tingling, weakness, or problems with the use of your arms or legs.  There is a change in bowel or bladder control.  You have increasing pain in any area of the body, including your belly (abdomen).  You notice shortness of breath, dizziness, or feel faint.  You feel sick to your stomach (nauseous), are throwing up (vomiting), or become sweaty.  You notice discoloration of your toes or legs, or your feet get very cold. MAKE SURE YOU:   Understand these instructions.  Will watch  your condition.  Will get help right away if you are not doing well or get worse. Document Released: 12/16/2004 Document Revised: 12/27/2012 Document Reviewed: 10/25/2012 [facility] Patient Information 2014 [place], [place].

## 2013-08-22 ENCOUNTER — Encounter: Payer: Self-pay | Admitting: Emergency Medicine

## 2013-08-23 ENCOUNTER — Encounter: Payer: Self-pay | Admitting: Emergency Medicine

## 2013-08-23 ENCOUNTER — Ambulatory Visit (INDEPENDENT_AMBULATORY_CARE_PROVIDER_SITE_OTHER): Payer: Medicaid Other | Admitting: Emergency Medicine

## 2013-08-23 VITALS — BP 129/87 | HR 82 | Temp 98.2°F | Wt 181.0 lb

## 2013-08-23 DIAGNOSIS — R300 Dysuria: Secondary | ICD-10-CM

## 2013-08-23 DIAGNOSIS — N8111 Cystocele, midline: Secondary | ICD-10-CM

## 2013-08-23 DIAGNOSIS — N8110 Cystocele, unspecified: Secondary | ICD-10-CM

## 2013-08-23 LAB — POCT URINALYSIS DIPSTICK
Bilirubin, UA: NEGATIVE
Blood, UA: NEGATIVE
Glucose, UA: NEGATIVE
KETONES UA: NEGATIVE
LEUKOCYTES UA: NEGATIVE
Nitrite, UA: NEGATIVE
Protein, UA: NEGATIVE
Spec Grav, UA: >=1.03
UROBILINOGEN UA: 0.2
pH, UA: 6

## 2013-08-23 NOTE — Patient Instructions (Signed)
It was nice to see you!  You do not have a bladder infection. You do have something called bladder prolapse.  This is were the bladder bulges into the vagina.  This is due to how you had your babies. I put in a referral for you to see the OB/GYNs to discuss management options. In the meantime, stay hydrated and be gentle.

## 2013-08-23 NOTE — Assessment & Plan Note (Signed)
Likely cause of her urinary frequency. Dysuria may be secondary to soreness following sex. UA was negative. Will refer to OB/GYN for additional management as patient finds symptoms quite bothersome.

## 2013-08-23 NOTE — Progress Notes (Signed)
   Subjective:    Patient ID: Heidi [name], female    DOB: [birth year], 33 y.o.   MRN: [id number]  HPI LINDSAY FREVERT is here for a same-day appointment for dysuria.  She reports vaginal pressure, urinary frequency, and some dysuria starting on Tuesday evening. This started after she had sex on Tuesday. She uses intravaginal toys and reports the sex was a little on the rough side. She also states she has noticed a bulge in her vagina since having sex. This bulge is not new, she's had it for quite some time. 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 No fevers or chills.  She has had 2 prior vaginal deliveries, one was a vacuum delivery and one was a forceps delivery.  Current Outpatient Prescriptions on File Prior to Visit  Medication Sig Dispense Refill  . albuterol (PROVENTIL HFA;VENTOLIN HFA) 108 (90 BASE) MCG/ACT inhaler Inhale 2 puffs into the lungs every 6 (six) hours as needed for wheezing (cough).  1 Inhaler  5  . aspirin-acetaminophen-caffeine (EXCEDRIN MIGRAINE) 250-250-65 MG per tablet Take 2 tablets by mouth every 3 (three) hours as needed. For headache      . cetirizine (ZYRTEC) 10 MG tablet Take 1 tablet (10 mg total) by mouth daily.  30 tablet  11  . Chlorphen-Pseudoephed-APAP (THERAFLU FLU/COLD PO) Take 1 packet by mouth every 4 (four) hours as needed (cold/flu).      . clonazePAM (KLONOPIN) 1 MG tablet Take 1 tablet (1 mg total) by mouth 2 (two) times daily as needed for anxiety. For anxiety  60 tablet  3  . cyclobenzaprine (FLEXERIL) 5 MG tablet Take 1 tablet (5 mg total) by mouth 3 (three) times daily as needed for muscle spasms.  15 tablet  0  . fluconazole (DIFLUCAN) 150 MG tablet Take 1 tablet (150 mg total) by mouth daily.  1 tablet  3  . HYDROcodone-acetaminophen (NORCO/VICODIN) 5-325 MG per tablet Take 1 tablet by mouth every 4 (four) hours as needed.  15 tablet  0  . ibuprofen (ADVIL,MOTRIN) 800 MG  tablet Take 1 tablet (800 mg total) by mouth every 8 (eight) hours as needed for mild pain.  30 tablet  0  . naproxen (NAPROSYN) 500 MG tablet Take 1 tablet (500 mg total) by mouth 2 (two) times daily.  14 tablet  0  . potassium chloride 20 MEQ TBCR Take 20 mEq by mouth daily.  30 tablet  1  . traMADol (ULTRAM) 50 MG tablet Take 1 tablet (50 mg total) by mouth every 8 (eight) hours as needed for severe pain.  30 tablet  0   No current facility-administered medications on file prior to visit.    I have reviewed and updated the following as appropriate: allergies and current medications SHx: former smoker  Health Maintenance: up to date    Review of Systems See HPI    Objective:   Physical Exam BP 129/87  Pulse 82  Temp(Src) 98.2 F (36.8 C) (Oral)  Wt 181 lb (82.101 kg)  LMP 08/08/2013 Gen: alert, cooperative, NAD Pelvic: normal external genitalia; normal vagina; anterior wall prolapse, grade 1 on exam      Assessment & Plan:

## 2013-09-26 ENCOUNTER — Encounter: Payer: Self-pay | Admitting: Obstetrics & Gynecology

## 2013-09-27 ENCOUNTER — Telehealth: Payer: Self-pay | Admitting: Family Medicine

## 2013-09-27 NOTE — Telephone Encounter (Signed)
After hours line  Patient called regarding constipation issues she has had since Sunday, which was when she last had a BM. She purchased a saline enema product and used about half which produced a BM with saline + small stool. A little while later used the rest of the saline enema but does not think she passed it all out and was concerned that she may possibly be retaining the saline. I informed her that saline would not cause her any additional problems, and that she may have passed it with the BM and not noticed the volume; even if she didn't pass this it would be harmless for her. I advised her that she could start taking a daily stool softener like miralax (1 packet/capful) until she is having regular BMs. She denies any abdominal pain, blood in stool, straining with BMs normally; advised if she were to develop these to either schedule same day visit in clinic or be evaluated at urgent care/ER. Patient verbalized understanding.   Tawni CarnesAndrew Acquanetta Cabanilla, MD 09/27/2013, 11:31 PM PGY-2, [facility]

## 2013-10-28 ENCOUNTER — Telehealth: Payer: Self-pay | Admitting: Family Medicine

## 2013-10-28 NOTE — Telephone Encounter (Signed)
Emergency Line / After Hours Call  Had intersexual intercourse last night. She feels like she is having more pressure in her bladder. She has no burning, no urinary incontinence. She will be evaluated for her prolapsed bladder on the 19th. She's had no dysuria or fevers.  There is no protrusion of her bladder through her vagina. She has felt this pressure before.   Advised the patient to continue to monitor her status. If the pressure gets worse, has dysuria or urinary incontinence, protrusion of the bladder through the vagina or fevers then [facility] Upon review of her chart in [facility], patient has had similar complaints in the past.   Clare GandyJeremy Schmitz, MD Family Medicine PGY-2

## 2013-11-05 ENCOUNTER — Ambulatory Visit: Payer: Medicaid Other | Admitting: Family Medicine

## 2013-11-07 ENCOUNTER — Ambulatory Visit (INDEPENDENT_AMBULATORY_CARE_PROVIDER_SITE_OTHER): Payer: Medicaid Other | Admitting: Obstetrics & Gynecology

## 2013-11-07 ENCOUNTER — Encounter: Payer: Self-pay | Admitting: Obstetrics & Gynecology

## 2013-11-07 VITALS — BP 117/68 | HR 70 | Temp 98.5°F | Ht 64.0 in | Wt 185.2 lb

## 2013-11-07 DIAGNOSIS — N393 Stress incontinence (female) (male): Secondary | ICD-10-CM

## 2013-11-07 NOTE — Progress Notes (Signed)
   Subjective:    Patient ID: Heidi [name], female    DOB: [birth year], 33 y.o.   MRN: [id number]  HPI 33 yo S AA G2P2 (11 and 306 yo kids), both vaginal deliveries. She is here today because of probable prolapsed bladder" She reports that for the last 4 months she has felt a difference/pressure in her vagina while doing squats. Her girlfriend looked at it and noticed a bulge. She does have GSUI since the last 3 months. She denies urge incontinence. She does report urinary frequency.   Review of Systems A rbs was 92 and she denies a h/o GDM. She reports a normal pap recently.    Objective:   Physical Exam  First degree cystocele No rectocele Minimal uterine prolapse  I tried to do a Q tip test for urethral mobility testing, however she felt "anxious" and was not able to open her legs for this test.      Assessment & Plan:  GSUI- refer to urology

## 2013-12-05 ENCOUNTER — Telehealth: Payer: Self-pay | Admitting: Family Medicine

## 2013-12-05 NOTE — Telephone Encounter (Signed)
Need refill on flexeril

## 2013-12-06 NOTE — Telephone Encounter (Signed)
Patient has never been prescribed flexeril in our clinic as far as I can tell with chart review.  It appears that she was prescribed Flexeril in ED in May 2015 for back strain.  I will not refill this now.  She is welcome to come in for an appointment to have her back pain evaluated if she feels like she needs Flexeril.  Shirlee Latch, MD, MPH PGY-1,  [facility] 12/06/2013  8:35 AM

## 2013-12-06 NOTE — Telephone Encounter (Signed)
Pt informed. She has an appt with dr Beryle Flock on the 22nd. Edison Wollschlager CMA

## 2013-12-11 ENCOUNTER — Encounter: Payer: Self-pay | Admitting: Family Medicine

## 2013-12-11 ENCOUNTER — Ambulatory Visit (INDEPENDENT_AMBULATORY_CARE_PROVIDER_SITE_OTHER): Payer: Medicaid Other | Admitting: Family Medicine

## 2013-12-11 VITALS — BP 112/67 | HR 70 | Temp 98.1°F | Wt 184.0 lb

## 2013-12-11 DIAGNOSIS — F411 Generalized anxiety disorder: Secondary | ICD-10-CM

## 2013-12-11 DIAGNOSIS — F419 Anxiety disorder, unspecified: Secondary | ICD-10-CM

## 2013-12-11 DIAGNOSIS — J309 Allergic rhinitis, unspecified: Secondary | ICD-10-CM

## 2013-12-11 DIAGNOSIS — M546 Pain in thoracic spine: Secondary | ICD-10-CM

## 2013-12-11 DIAGNOSIS — K5900 Constipation, unspecified: Secondary | ICD-10-CM

## 2013-12-11 DIAGNOSIS — M549 Dorsalgia, unspecified: Secondary | ICD-10-CM | POA: Insufficient documentation

## 2013-12-11 MED ORDER — CLONAZEPAM 1 MG PO TABS: 1.0000 mg | ORAL_TABLET | Freq: Two times a day (BID) | ORAL | Status: DC | PRN

## 2013-12-11 MED ORDER — CETIRIZINE HCL 10 MG PO TABS: 10.0000 mg | ORAL_TABLET | Freq: Every day | ORAL | Status: DC

## 2013-12-11 MED ORDER — CITALOPRAM HYDROBROMIDE 20 MG PO TABS: 20.0000 mg | ORAL_TABLET | Freq: Every day | ORAL | Status: DC

## 2013-12-11 MED ORDER — CYCLOBENZAPRINE HCL 5 MG PO TABS: 5.0000 mg | ORAL_TABLET | Freq: Three times a day (TID) | ORAL | Status: DC | PRN

## 2013-12-11 MED ORDER — POLYETHYLENE GLYCOL 3350 17 GM/SCOOP PO POWD
9.0000 g | Freq: Every day | ORAL | Status: DC
Start: 2013-12-11 — End: 2014-07-16

## 2013-12-11 NOTE — Assessment & Plan Note (Signed)
Stable. Refilled Klonopin today. Started citalopram 20 mg daily today. Plan to increase citalopram to 40 mg daily at next visit. Plan to taper off Klonopin in ~3 months if doing well on citalopram. F/u in 1 month.

## 2013-12-11 NOTE — Assessment & Plan Note (Signed)
Stable, well controlled. Refilled Zyrtec today. Followup prn.

## 2013-12-11 NOTE — Assessment & Plan Note (Signed)
Stable. Refilled Flexeril today. Don't want to make other changes currently as adding citalopram. Patient looking for another job where she will not be lifting heavy things. Followup in one month.
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
## 2013-12-11 NOTE — Assessment & Plan Note (Signed)
Started MiraLAX half cap full daily. Advised patient to take 1/2 bottle of mag citrate today to relieve current constipation. Followup in one month.

## 2013-12-11 NOTE — Patient Instructions (Signed)
It was nice to meet you today!  I sent in refills of your Klonopin, flexeril, and zyrtec.  I am startign you on a low dose of Citalopram to help with your anxiety.  If you have any thoughts of hurting yourself or others call us or go to the emergency department immediately.    For your constipation, you should start taking half of a cap full of Miralax daily.  You can use magnesium citrate as needed in addition.  Come back and see me in 1 month.  Best, Dr. Beryle Flock (Dr. B)   Constipation Constipation is when a person has fewer than three bowel movements a week, has difficulty having a bowel movement, or has stools that are dry, hard, or larger than normal. As people grow older, constipation is more common. If you try to fix constipation with medicines that make you have a bowel movement (laxatives), the problem may get worse. Long-term laxative use may cause the muscles of the colon to become weak. A low-fiber diet, not taking in enough fluids, and taking certain medicines may make constipation worse.  CAUSES   Certain medicines, such as antidepressants, pain medicine, iron supplements, antacids, and water pills.   Certain diseases, such as diabetes, irritable bowel syndrome (IBS), thyroid disease, or depression.   Not drinking enough water.   Not eating enough fiber-rich foods.   Stress or travel.   Lack of physical activity or exercise.   Ignoring the urge to have a bowel movement.   Using laxatives too much.  SIGNS AND SYMPTOMS   Having fewer than three bowel movements a week.   Straining to have a bowel movement.   Having stools that are hard, dry, or larger than normal.   Feeling full or bloated.   Pain in the lower abdomen.   Not feeling relief after having a bowel movement.  DIAGNOSIS  Your health care provider will take a medical history and perform a physical exam. Further testing may be done for severe constipation. Some tests may include:  A  barium enema X-ray to examine your rectum, colon, and, sometimes, your small intestine.   A sigmoidoscopy to examine your lower colon.   A colonoscopy to examine your entire colon. TREATMENT  Treatment will depend on the severity of your constipation and what is causing it. Some dietary treatments include drinking more fluids and eating more fiber-rich foods. Lifestyle treatments may include regular exercise. If these diet and lifestyle recommendations do not help, your health care provider may recommend taking over-the-counter laxative medicines to help you have bowel movements. Prescription medicines may be prescribed if over-the-counter medicines do not work.  HOME CARE INSTRUCTIONS   Eat foods that have a lot of fiber, such as fruits, vegetables, whole grains, and beans.  Limit foods high in fat and processed sugars, such as french fries, hamburgers, cookies, candies, and soda.   A fiber supplement may be added to your diet if you cannot get enough fiber from foods.   Drink enough fluids to keep your urine clear or pale yellow.   Exercise regularly or as directed by your health care provider.   Go to the restroom when you have the urge to go. Do not hold it.   Only take over-the-counter or prescription medicines as directed by your health care provider. Do not take other medicines for constipation without talking to your health care provider first.  SEEK IMMEDIATE MEDICAL CARE IF:   You have bright red blood in your stool.  Your constipation lasts for more than 4 days or gets worse.   You have abdominal or rectal pain.   You have thin, pencil-like stools.   You have unexplained weight loss. MAKE SURE YOU:   Understand these instructions.  Will watch your condition.  Will get help right away if you are not doing well or get worse. Document Released: 12/05/2003 Document Revised: 03/13/2013 Document Reviewed: 12/18/2012 [facility] Patient Information 2015  [place], [place]. This information is not intended to replace advice given to you by your health care provider. Make sure you discuss any questions you have with your health care provider.

## 2014-01-04 ENCOUNTER — Other Ambulatory Visit: Payer: Self-pay

## 2014-01-15 ENCOUNTER — Ambulatory Visit: Payer: Medicaid Other | Admitting: Family Medicine

## 2014-01-16 ENCOUNTER — Other Ambulatory Visit: Payer: Self-pay | Admitting: Family Medicine

## 2014-01-16 DIAGNOSIS — F419 Anxiety disorder, unspecified: Secondary | ICD-10-CM

## 2014-01-16 NOTE — Telephone Encounter (Signed)
Pt called and needs a refill on her Klonopin. She just started a new job and will make an appointment for December once she know her schedule. Please call patient when this is ready so she knows to pick up. jw

## 2014-01-18 ENCOUNTER — Telehealth: Payer: Self-pay | Admitting: Family Medicine

## 2014-01-18 MED ORDER — CLONAZEPAM 1 MG PO TABS: 1.0000 mg | ORAL_TABLET | Freq: Two times a day (BID) | ORAL | Status: DC | PRN

## 2014-01-18 NOTE — Telephone Encounter (Signed)
Refill of Klonopin available at front desk for pick up.  Patient will need to be seen in clinic before another refill will be given.  We also need to follow-up on how new prescription (Celexa) is doing.  Shirlee LatchAngela Glenis Musolf, MD, MPH PGY-1,  [facility]

## 2014-01-18 NOTE — Telephone Encounter (Signed)
Pt informed. Will make appt when she comes in to pick up RX. [place], [place] CMA

## 2014-01-21 ENCOUNTER — Encounter: Payer: Self-pay | Admitting: Family Medicine

## 2014-02-01 ENCOUNTER — Telehealth: Payer: Self-pay | Admitting: Family Medicine

## 2014-02-08 ENCOUNTER — Ambulatory Visit: Payer: Medicaid Other | Admitting: Family Medicine

## 2014-03-08 ENCOUNTER — Encounter: Payer: Self-pay | Admitting: Family Medicine

## 2014-03-08 ENCOUNTER — Ambulatory Visit (INDEPENDENT_AMBULATORY_CARE_PROVIDER_SITE_OTHER): Payer: Medicaid Other | Admitting: Family Medicine

## 2014-03-08 VITALS — BP 125/79 | HR 71 | Temp 98.3°F | Ht 64.0 in | Wt 180.0 lb

## 2014-03-08 DIAGNOSIS — F419 Anxiety disorder, unspecified: Secondary | ICD-10-CM

## 2014-03-08 DIAGNOSIS — F411 Generalized anxiety disorder: Secondary | ICD-10-CM

## 2014-03-08 DIAGNOSIS — N8110 Cystocele, unspecified: Secondary | ICD-10-CM

## 2014-03-08 MED ORDER — CITALOPRAM HYDROBROMIDE 20 MG PO TABS: 20.0000 mg | ORAL_TABLET | Freq: Every day | ORAL | Status: DC

## 2014-03-08 MED ORDER — CLONAZEPAM 0.5 MG PO TABS: 0.5000 mg | ORAL_TABLET | Freq: Two times a day (BID) | ORAL | Status: DC

## 2014-03-08 MED ORDER — CLONAZEPAM 1 MG PO TABS: 1.0000 mg | ORAL_TABLET | Freq: Two times a day (BID) | ORAL | Status: DC

## 2014-03-08 NOTE — Patient Instructions (Signed)
It was nice to see you again today. Keep taking the Celexa daily. Klonopin we will start with twice daily dosing of 1 mg. In 2 weeks we will step down to 0.5 mg twice daily for 2 weeks. Then we will stop completely. I like to see you back in one month to see how you're doing off of the Klonopin.  Take Care, Dr. B  Generalized Anxiety Disorder Generalized anxiety disorder (GAD) is a mental disorder. It interferes with life functions, including relationships, work, and school. GAD is different from normal anxiety, which everyone experiences at some point in their lives in response to specific life events and activities. Normal anxiety actually helps us prepare for and get through these life events and activities. Normal anxiety goes away after the event or activity is over.  GAD causes anxiety that is not necessarily related to specific events or activities. It also causes excess anxiety in proportion to specific events or activities. The anxiety associated with GAD is also difficult to control. GAD can vary from mild to severe. People with severe GAD can have intense waves of anxiety with physical symptoms (panic attacks).  SYMPTOMS The anxiety and worry associated with GAD are difficult to control. This anxiety and worry are related to many life events and activities and also occur more days than not for 6 months or longer. People with GAD also have three or more of the following symptoms (one or more in children):  Restlessness.   Fatigue.  Difficulty concentrating.   Irritability.  Muscle tension.  Difficulty sleeping or unsatisfying sleep. DIAGNOSIS GAD is diagnosed through an assessment by your health care provider. Your health care provider will ask you questions aboutyour mood,physical symptoms, and events in your life. Your health care provider may ask you about your medical history and use of alcohol or drugs, including prescription medicines. Your health care provider may also do  a physical exam and blood tests. Certain medical conditions and the use of certain substances can cause symptoms similar to those associated with GAD. Your health care provider may refer you to a mental health specialist for further evaluation. TREATMENT The following therapies are usually used to treat GAD:   Medication. Antidepressant medication usually is prescribed for long-term daily control. Antianxiety medicines may be added in severe cases, especially when panic attacks occur.   Talk therapy (psychotherapy). Certain types of talk therapy can be helpful in treating GAD by providing support, education, and guidance. A form of talk therapy called cognitive behavioral therapy can teach you healthy ways to think about and react to daily life events and activities.  Stress managementtechniques. These include yoga, meditation, and exercise and can be very helpful when they are practiced regularly. A mental health specialist can help determine which treatment is best for you. Some people see improvement with one therapy. However, other people require a combination of therapies. Document Released: 07/03/2012 Document Revised: 07/23/2013 Document Reviewed: 07/03/2012 [facility] Patient Information 2015 [place]. This information is not intended to replace advice given to you by your health care provider. Make sure you discuss any questions you have with your health care provider.

## 2014-03-08 NOTE — Assessment & Plan Note (Signed)
Recommend patient call gynecology for follow-up appointment. Can consider referral to [place]cology in [place] in the future.

## 2014-03-08 NOTE — Assessment & Plan Note (Signed)
Improving on Celexa. We will decrease Klonopin to 1 mg twice a day for 2 weeks and then 0.5 mg twice a day for 2 weeks and then discontinue. Continue Celexa 20 mg daily. Can consider increasing to 40 mg daily at next visit.

## 2014-03-08 NOTE — Progress Notes (Signed)
   Subjective:   Heidi [name] is a 33 y.o. female with a history of anxiety here for follow-up.  1. Anxiety: Patient reports improvement in baseline anxiety since starting Celexa. Chest reports that the stressors in her life are improving. She is in a stable relationship, she recently guarding car, and she recently started a new job.  She would like to get off the Klonopin when possible.  2. Bladder prolapse: Patient with known bladder prolapse who has seen gynecology for this issue in the past. She was nervous at the testing and left before they were able to evaluate her. She was like to see someone about this again. She reports stress urinary incontinence.  Review of Systems:  Per HPI. All other systems reviewed and are negative.   PMH, PSH, Medications, Allergies, and FmHx reviewed and updated in EMR.   Objective:  BP 125/79 mmHg  Pulse 71  Temp(Src) 98.3 F (36.8 C) (Oral)  Ht 5\' 4"  (1.626 m)  Wt 180 lb (81.647 kg)  BMI 30.88 kg/m2  LMP 02/08/2014  Gen:  33 y.o. female in NAD HEENT: NCAT, MMM, EOMI, PERRL, anicteric sclerae Abd: Soft, NTND, BS present, no guarding or organomegaly Ext: WWP, no edema Neuro: Alert and oriented, speech normal    Assessment:     Heidi [name] is a 33 y.o. female here for anxiety, bladder prolapse.    Plan:     See problem list for problem-specific plans.   Shirlee LatchAngela Bacigalupo, MD PGY-1,  [facility] 03/08/2014  4:46 PM

## 2014-04-30 ENCOUNTER — Ambulatory Visit (INDEPENDENT_AMBULATORY_CARE_PROVIDER_SITE_OTHER): Payer: Medicaid Other | Admitting: Family Medicine

## 2014-04-30 ENCOUNTER — Encounter: Payer: Self-pay | Admitting: Family Medicine

## 2014-04-30 VITALS — BP 120/75 | HR 69 | Temp 97.7°F | Ht 64.0 in | Wt 182.0 lb

## 2014-04-30 DIAGNOSIS — K219 Gastro-esophageal reflux disease without esophagitis: Secondary | ICD-10-CM

## 2014-04-30 DIAGNOSIS — K2970 Gastritis, unspecified, without bleeding: Secondary | ICD-10-CM | POA: Insufficient documentation

## 2014-04-30 MED ORDER — PANTOPRAZOLE SODIUM 40 MG PO TBEC: 40.0000 mg | DELAYED_RELEASE_TABLET | Freq: Every day | ORAL | Status: DC

## 2014-04-30 MED ORDER — ACETAMINOPHEN-CODEINE #3 300-30 MG PO TABS
1.0000 | ORAL_TABLET | Freq: Four times a day (QID) | ORAL | Status: DC | PRN
Start: 2014-04-30 — End: 2014-08-29

## 2014-04-30 MED ORDER — RANITIDINE HCL 150 MG PO TABS: 150.0000 mg | ORAL_TABLET | Freq: Two times a day (BID) | ORAL | Status: DC

## 2014-04-30 NOTE — Patient Instructions (Signed)
Thank you for coming in, today!  I think you have pretty severe reflux and gastritis (irritation of the stomach).  I want to be fairly aggressive to treat it. Take Protonix (pantoprazole) 40 mg once day and Zantac (ranitidine) 150 mg twice a day, every day. AVOID Excedrin, ibuprofen, Aleve, or aspirin completely if possible. If you absolutely have to take it, take 1 or two a day. I will prescribed Tylenol #3 (acetaminophen and codeine) for headaches in the meantime.  Give these medicines 2-3 weeks at least to see how they help. After that come back to see Dr. Beryle FlockBacigalupo ("Dr. B") and she'll help decide what to do next.  Please feel free to call with any questions or concerns at any time, at [phone]. --Dr. Casper HarrisonStreet

## 2014-04-30 NOTE — Progress Notes (Signed)
   Subjective:    Patient ID: Heidi [name], female    DOB: 11/20/1980, 34 y.o.   MRN: [id number]  HPI: Pt presents to clinic for SDA for abdominal pain, concerned for possible ulcer; overall her symptoms have been present for about 1 year and have been worse for about 1 year. She reports dull, intermittent pain in her middle / upper abdomen that radiates up. Pain is brought on by eating, especially fatty / spicy foods. She does have some increased belching and regurgitation with it. PeptoBismol has helped some; other than that, she has not tried anything. Of note, pt does take Excedrin for headaches; she has been taking it for about 2 years, almost every day. She states she alternates between brand and generic Excedrin. She reports she takes up to 8 tablets per day when she has very bad headaches; more typically she takes 2-3 per day. She denies nausea or vomiting with these episodes. She occasionally has hard stools but no dark stools or frank blood in her stool. She does endorse cough without production and occasional sour / acidic taste in the back of her mouth and throat.  Pt states she has a "bad family history of reflux and ulcers." She has never been seen by a GI doctor, before. She has been diagnosed with gastritis in the past, but it got better on its own.  Review of Systems: As above.     Objective:   Physical Exam BP 120/75 mmHg  Pulse 69  Temp(Src) 97.7 F (36.5 C) (Oral)  Ht 5\' 4"  (1.626 m)  Wt 182 lb (82.555 kg)  BMI 31.22 kg/m2  LMP 04/08/2014 Gen: well-appearing adult female in NAD; mildly anxious but easily reassured HEENT: Heidi [name]/AT, EOMI, PERRLA, TM's clear bilaterally  Nasal mucosa clear, no rhinorrhea  Posterior oropharynx moderately inflamed without tonsillar swelling / exudates Cardio: RRR, no murmur appreciated Pulm: CTAB, no wheezes Abd: soft, mildly tender in left upper quadrant, more tender in epigastrium  BS+, no distension  Increased heartburn-type sensation  with lying flat for exam  No RUQ, HSM, guarding, rebound, or frank peritoneal signs Ext: warm, well-perfused, no rashes     Assessment & Plan:  34yo female with history and exam overall consistent with gastritis, with known history of GERD, not currently on medications - very like exacerbated by heavy use of NSAIDs for chronic headache - considered abd US, medication adjustments, and GI referral, but pt comfortable with management through our office, first - no red flags to suggest GI-related bleeding, malignant process, or frank surgical abdomen  Plan: - Rx for Protonix (pt has taken this before and felt it helped) plus Zantac for at least 2 weeks - instructed pt to cut back on Excedrin considerably, 1-2 per day or to stop completely if possible - Rx for Tylenol #3 for headache / likely rebound headache with cutting back NSAIDs - defer imaging for now, given unlikely gallbladder or pancreatic pathology - defer referral for now pending trial of PPI and H2 blocker - pt to f/u with PCP Dr. Beryle FlockBacigalupo in a few weeks to re-eval and consider further work-up, as necessary  Note FYI to Dr. Alroy DustB.  Heidi [name] M Heidi Vinje, MD PGY-3, [facility] 04/30/2014, 8:40 PM

## 2014-05-01 NOTE — Progress Notes (Signed)
I agree with the resident documentation and plan.   Kyle Fletke MD  

## 2014-05-14 ENCOUNTER — Ambulatory Visit (INDEPENDENT_AMBULATORY_CARE_PROVIDER_SITE_OTHER): Payer: Medicaid Other | Admitting: Family Medicine

## 2014-05-14 ENCOUNTER — Encounter: Payer: Self-pay | Admitting: Family Medicine

## 2014-05-14 VITALS — BP 111/76 | HR 73 | Temp 98.0°F | Ht 64.0 in | Wt 179.6 lb

## 2014-05-14 DIAGNOSIS — F419 Anxiety disorder, unspecified: Secondary | ICD-10-CM

## 2014-05-14 DIAGNOSIS — F411 Generalized anxiety disorder: Secondary | ICD-10-CM

## 2014-05-14 MED ORDER — CLONAZEPAM 0.5 MG PO TABS: 0.5000 mg | ORAL_TABLET | Freq: Two times a day (BID) | ORAL | Status: DC

## 2014-05-14 NOTE — Patient Instructions (Signed)
It was nice to see you again today. I refilled your Klonopin. I will see back in about one month to discuss weight loss strategies. I will see back in about 2 months to talk about your anxiety.  Take care, Dr. Jacinto Reap  Stress and Stress Management Stress is a normal reaction to life events. It is what you feel when life demands more than you are used to or more than you can handle. Some stress can be useful. For example, the stress reaction can help you catch the last bus of the day, study for a test, or meet a deadline at work. But stress that occurs too often or for too long can cause problems. It can affect your emotional health and interfere with relationships and normal daily activities. Too much stress can weaken your immune system and increase your risk for physical illness. If you already have a medical problem, stress can make it worse. CAUSES  All sorts of life events may cause stress. An event that causes stress for one person may not be stressful for another person. Major life events commonly cause stress. These may be positive or negative. Examples include losing your job, moving into a new home, getting married, having a baby, or losing a loved one. Less obvious life events may also cause stress, especially if they occur day after day or in combination. Examples include working long hours, driving in traffic, caring for children, being in debt, or being in a difficult relationship. SIGNS AND SYMPTOMS Stress may cause emotional symptoms including, the following:  Anxiety. This is feeling worried, afraid, on edge, overwhelmed, or out of control.  Anger. This is feeling irritated or impatient.  Depression. This is feeling sad, down, helpless, or guilty.  Difficulty focusing, remembering, or making decisions. Stress may cause physical symptoms, including the following:   Aches and pains. These may affect your head, neck, back, stomach, or other areas of your body.  Tight muscles or  clenched jaw.  Low energy or trouble sleeping. Stress may cause unhealthy behaviors, including the following:   Eating to feel better (overeating) or skipping meals.  Sleeping too little, too much, or both.  Working too much or putting off tasks (procrastination).  Smoking, drinking alcohol, or using drugs to feel better. DIAGNOSIS  Stress is diagnosed through an assessment by your health care provider. Your health care provider will ask questions about your symptoms and any stressful life events.Your health care provider will also ask about your medical history and may order blood tests or other tests. Certain medical conditions and medicine can cause physical symptoms similar to stress. Mental illness can cause emotional symptoms and unhealthy behaviors similar to stress. Your health care provider may refer you to a mental health professional for further evaluation.  TREATMENT  Stress management is the recommended treatment for stress.The goals of stress management are reducing stressful life events and coping with stress in healthy ways.  Techniques for reducing stressful life events include the following:  Stress identification. Self-monitor for stress and identify what causes stress for you. These skills may help you to avoid some stressful events.  Time management. Set your priorities, keep a calendar of events, and learn to say "no." These tools can help you avoid making too many commitments. Techniques for coping with stress include the following:  Rethinking the problem. Try to think realistically about stressful events rather than ignoring them or overreacting. Try to find the positives in a stressful situation rather than focusing on the  negatives.  Exercise. Physical exercise can release both physical and emotional tension. The key is to find a form of exercise you enjoy and do it regularly.  Relaxation techniques. These relax the body and mind. Examples include yoga,  meditation, tai chi, biofeedback, deep breathing, progressive muscle relaxation, listening to music, being out in nature, journaling, and other hobbies. Again, the key is to find one or more that you enjoy and can do regularly.  Healthy lifestyle. Eat a balanced diet, get plenty of sleep, and do not smoke. Avoid using alcohol or drugs to relax.  Strong support network. Spend time with family, friends, or other people you enjoy being around.Express your feelings and talk things over with someone you trust. Counseling or talktherapy with a mental health professional may be helpful if you are having difficulty managing stress on your own. Medicine is typically not recommended for the treatment of stress.Talk to your health care provider if you think you need medicine for symptoms of stress. HOME CARE INSTRUCTIONS  Keep all follow-up visits as directed by your health care provider.  Take all medicines as directed by your health care provider. SEEK MEDICAL CARE IF:  Your symptoms get worse or you start having new symptoms.  You feel overwhelmed by your problems and can no longer manage them on your own. SEEK IMMEDIATE MEDICAL CARE IF:  You feel like hurting yourself or someone else. Document Released: 09/01/2000 Document Revised: 07/23/2013 Document Reviewed: 10/31/2012 [facility] Patient Information 2015 [place], [place]. This information is not intended to replace advice given to you by your health care provider. Make sure you discuss any questions you have with your health care provider.

## 2014-05-14 NOTE — Progress Notes (Signed)
   Subjective:   Heidi [name] is a 34 y.o. female with a history of anxiety and stress urinary incontinence and anxiety here for anxiety f/u.  Patient reports anxiety that is stable but affected by a lot of life situations currently.  She reports stress at her job and at home. She reports that her computer is at the [employer] and she is often unable to make her car payments. She reports a lot of financial stress and stress over custody of her children. She is also worried about her upcoming GYN appointment for urinary incontinence.  She has been taking Klonopin 0.5 mg twice a day after we tapered from 1 mg twice a day at last visit.  Review of Systems:  Per HPI. All other systems reviewed and are negative.   PMH, PSH, Medications, Allergies, and FmHx reviewed and updated in EMR.  Social History: never smoker  Objective:  BP 111/76 mmHg  Pulse 73  Temp(Src) 98 F (36.7 C) (Oral)  Wt 179 lb 9.6 oz (81.466 kg)  LMP 04/08/2014  Gen:  34 y.o. female in NAD HEENT: NCAT, MMM, EOMI CV: RRR, no MRG Resp: Non-labored, CTAB, no wheezes noted Ext: WWP, no edema Neuro: Alert and oriented, speech normal    Assessment:     Heidi Chamberlainameka N Mancusi is a 34 y.o. female here for anxiety f/u.    Plan:     See problem list for problem-specific plans.   Shirlee LatchAngela Krisanne Lich, MD PGY-1,  [facility] 05/14/2014  3:40 PM

## 2014-05-14 NOTE — Progress Notes (Signed)
agree

## 2014-05-14 NOTE — Assessment & Plan Note (Signed)
Stable on celexa and decreased Klonopin dose. Refill given of Klonopin 0.5mg  BID. Will attempt to stop benzo at next appt and consider increasing celexa to 40mg  daily F/u in 2 months

## 2014-05-24 NOTE — Telephone Encounter (Signed)
LM we have gotten returned mail in our attempt to advised our recent office relocation. Advised to please callback at earliest convenience to update demo.

## 2014-05-27 ENCOUNTER — Ambulatory Visit (INDEPENDENT_AMBULATORY_CARE_PROVIDER_SITE_OTHER): Payer: Self-pay | Admitting: Obstetrics & Gynecology

## 2014-05-27 ENCOUNTER — Encounter: Payer: Self-pay | Admitting: Obstetrics & Gynecology

## 2014-05-27 VITALS — BP 119/77 | HR 75 | Ht 63.0 in | Wt 182.4 lb

## 2014-05-27 DIAGNOSIS — N8110 Cystocele, unspecified: Secondary | ICD-10-CM

## 2014-05-27 NOTE — Patient Instructions (Signed)
Kegel Exercises The goal of Kegel exercises is to isolate and exercise your pelvic floor muscles. These muscles act as a hammock that supports the rectum, vagina, small intestine, and uterus. As the muscles weaken, the hammock sags and these organs are displaced from their normal positions. Kegel exercises can strengthen your pelvic floor muscles and help you to improve bladder and bowel control, improve sexual response, and help reduce many problems and some discomfort during pregnancy. Kegel exercises can be done anywhere and at any time. HOW TO PERFORM KEGEL EXERCISES 1. Locate your pelvic floor muscles. To do this, squeeze (contract) the muscles that you use when you try to stop the flow of urine. You will feel a tightness in the vaginal area (women) and a tight lift in the rectal area (men and women). 2. When you begin, contract your pelvic muscles tight for 2-5 seconds, then relax them for 2-5 seconds. This is one set. Do 4-5 sets with a short pause in between. 3. Contract your pelvic muscles for 8-10 seconds, then relax them for 8-10 seconds. Do 4-5 sets. If you cannot contract your pelvic muscles for 8-10 seconds, try 5-7 seconds and work your way up to 8-10 seconds. Your goal is 4-5 sets of 10 contractions each day. Keep your stomach, buttocks, and legs relaxed during the exercises. Perform sets of both short and long contractions. Vary your positions. Perform these contractions 3-4 times per day. Perform sets while you are:   Lying in bed in the morning.  Standing at lunch.  Sitting in the late afternoon.  Lying in bed at night. You should do 40-50 contractions per day. Do not perform more Kegel exercises per day than recommended. Overexercising can cause muscle fatigue. Continue these exercises for for at least 15-20 weeks or as directed by your caregiver. Document Released: 02/23/2012 Document Reviewed: 02/23/2012 [employer] Patient Information 2015 [employer]. This information is  not intended to replace advice given to you by your health care provider. Make sure you discuss any questions you have with your health care provider.  

## 2014-05-27 NOTE — Progress Notes (Signed)
Subjective:     Patient ID: Heidi [name], female   DOB: March 14, 1981, 34 y.o.   MRN: [id number]  HPI Pt presents for eval of cystocele.  She was seen prev but, reports anxiety with the provider and wanted a second opinion.  She denies urinary incontinence.   She reports that she can feel it and her partner can feel it but, she has no other sx.      Review of Systems     Objective:   Physical Exam BP 119/77 mmHg  Pulse 75  Ht 5\' 3"  (1.6 m)  Wt 182 lb 6.4 oz (82.736 kg)  BMI 32.32 kg/m2  LMP 05/14/2014 (Exact Date) GU: EGBUS: no lesions Vagina: no blood in vault; Grade I cystocele Cervix: no lesion; no mucopurulent d/c; no CMT Uterus: small, mobile Adnexa: no masses; non tender         Assessment:     Grade I cystocele- asymptomatic.  No incontinece    Plan:     Rec f/u in 1 year. Pt may do Kegel exercises if she desires

## 2014-06-06 ENCOUNTER — Other Ambulatory Visit: Payer: Self-pay | Admitting: Family Medicine

## 2014-06-12 ENCOUNTER — Ambulatory Visit (INDEPENDENT_AMBULATORY_CARE_PROVIDER_SITE_OTHER): Payer: Self-pay | Admitting: Family Medicine

## 2014-06-12 ENCOUNTER — Encounter: Payer: Self-pay | Admitting: Family Medicine

## 2014-06-12 VITALS — BP 115/75 | HR 77 | Temp 98.2°F | Ht 63.0 in | Wt 183.0 lb

## 2014-06-12 DIAGNOSIS — E669 Obesity, unspecified: Secondary | ICD-10-CM | POA: Insufficient documentation

## 2014-06-12 NOTE — Assessment & Plan Note (Signed)
Counseled on diet and exercise To work on 3 goals (see AVS) F/u in 2 weeks

## 2014-06-12 NOTE — Patient Instructions (Addendum)
Nice to see you again.  Goals: 1. Cut down on carbs in diet (2 servings per meal) 2. Starting walking more 1hr at least 5 times per week 3. Avoid sugary drinks  Eat at least 3 meals (protein, carb, vegetable and/or fruit) and 1-2 snacks per day.  Aim for no more than 5 hours between eating.  Eat breakfast within one hour of getting up.   Come back to see me in 2 weeks to see how you are doing.  Take care, Dr. BLeonard Schwartz

## 2014-06-12 NOTE — Progress Notes (Signed)
   Subjective:   Heidi [name] is a 34 y.o. female with a history of obesity, depression, anxiety here for discussing weight loss resources.  24-hr recall:  (Up at  AM) 4:30am B (5 AM)- white bagel, cream cheese (a lot), fruit punch  L (10 AM)- 4 cups raisin bran, spinach juicer (with cucumber, carrots, fruit)  Snk (3 PM)- Chocolate covered pretzels (large bag), water D (9:30 PM)- Baked tilapia with pesto, yellow rice, black eyed peas, corn, capri sun   Typical day? Yes.    No exercise currently, about to start [employer] fitness (1hr class 3x/wk). Lost weight last time she was doing program  [employer] low-carb diet in the past - felt like it was working.  Review of Systems:  Per HPI. All other systems reviewed and are negative.   PMH, PSH, Medications, Allergies, and FmHx reviewed and updated in EMR.  Social History: never smoker  Objective:  BP 115/75 mmHg  Pulse 77  Temp(Src) 98.2 F (36.8 C) (Oral)  Ht 5\' 3"  (1.6 m)  Wt 183 lb (83.008 kg)  BMI 32.43 kg/m2  LMP 05/14/2014 (Exact Date)  Gen:  34 y.o. female in NAD HEENT: NCAT, MMM, EOMI, PERRL, anicteric sclerae CV: RRR, no MRG, no JVD Resp: Non-labored, CTAB, no wheezes noted Ext: WWP, no edema Neuro: Alert and oriented, speech normal     Assessment:     Heidi [name] is a 34 y.o. female here for wieght loss counseling.    Plan:     See problem list for problem-specific plans.   Erasmo DownerAngela M Tameya Kuznia, MD PGY-1,  [facility] 06/12/2014  3:48 PM

## 2014-06-17 ENCOUNTER — Other Ambulatory Visit: Payer: Self-pay | Admitting: *Deleted

## 2014-06-17 DIAGNOSIS — F419 Anxiety disorder, unspecified: Secondary | ICD-10-CM

## 2014-06-18 MED ORDER — CLONAZEPAM 0.5 MG PO TABS: 0.5000 mg | ORAL_TABLET | Freq: Two times a day (BID) | ORAL | Status: DC

## 2014-06-18 NOTE — Telephone Encounter (Signed)
Placed at front desk for pick up. Last discussed during visit on 05/19/14.

## 2014-06-18 NOTE — Telephone Encounter (Signed)
Left message on voicemail informing that Rx was ready to be picked up.

## 2014-06-24 ENCOUNTER — Ambulatory Visit: Payer: Medicaid Other

## 2014-07-16 ENCOUNTER — Emergency Department (HOSPITAL_COMMUNITY)
Admission: EM | Admit: 2014-07-16 | Discharge: 2014-07-16 | Disposition: A | Discharge status: Home / Self Care | Payer: Medicaid Other | Attending: Emergency Medicine | Admitting: Emergency Medicine

## 2014-07-16 ENCOUNTER — Encounter (HOSPITAL_COMMUNITY): Payer: Self-pay | Admitting: Emergency Medicine

## 2014-07-16 ENCOUNTER — Emergency Department (HOSPITAL_COMMUNITY): Payer: Medicaid Other

## 2014-07-16 DIAGNOSIS — Z79899 Other long term (current) drug therapy: Secondary | ICD-10-CM | POA: Insufficient documentation

## 2014-07-16 DIAGNOSIS — Z62810 Personal history of physical and sexual abuse in childhood: Secondary | ICD-10-CM | POA: Insufficient documentation

## 2014-07-16 DIAGNOSIS — Z91040 Latex allergy status: Secondary | ICD-10-CM | POA: Insufficient documentation

## 2014-07-16 DIAGNOSIS — F329 Major depressive disorder, single episode, unspecified: Secondary | ICD-10-CM | POA: Insufficient documentation

## 2014-07-16 DIAGNOSIS — Z3202 Encounter for pregnancy test, result negative: Secondary | ICD-10-CM | POA: Insufficient documentation

## 2014-07-16 DIAGNOSIS — F419 Anxiety disorder, unspecified: Secondary | ICD-10-CM | POA: Insufficient documentation

## 2014-07-16 DIAGNOSIS — Z8742 Personal history of other diseases of the female genital tract: Secondary | ICD-10-CM | POA: Insufficient documentation

## 2014-07-16 DIAGNOSIS — Z87891 Personal history of nicotine dependence: Secondary | ICD-10-CM | POA: Insufficient documentation

## 2014-07-16 DIAGNOSIS — K5900 Constipation, unspecified: Secondary | ICD-10-CM | POA: Insufficient documentation

## 2014-07-16 LAB — URINALYSIS, ROUTINE W REFLEX MICROSCOPIC
Bilirubin Urine: NEGATIVE
Glucose, UA: NEGATIVE mg/dL
Hgb urine dipstick: NEGATIVE
Ketones, ur: NEGATIVE mg/dL
Leukocytes, UA: NEGATIVE
Nitrite: NEGATIVE
PH: 6 (ref 5.0–8.0)
Protein, ur: NEGATIVE mg/dL
Specific Gravity, Urine: 1.012 (ref 1.005–1.030)
Urobilinogen, UA: 0.2 mg/dL (ref 0.0–1.0)

## 2014-07-16 LAB — POC URINE PREG, ED: PREG TEST UR: NEGATIVE

## 2014-07-16 MED ORDER — POLYETHYLENE GLYCOL 3350 17 GM/SCOOP PO POWD: ORAL | Status: DC

## 2014-07-16 MED ORDER — FLEET ENEMA 7-19 GM/118ML RE ENEM
1.0000 | ENEMA | Freq: Once | RECTAL | Status: AC
  Administered 2014-07-16: 1 via RECTAL
  Filled 2014-07-16: qty 1

## 2014-07-16 NOTE — ED Notes (Signed)
Pt states she is constipated  Pt states she has issues with this anyway  Pt states she drank some epsom salt last night and has had some runny stool but it is coming around an impaction  Pt states she has been up all night trying to go to the restroom

## 2014-07-16 NOTE — ED Notes (Signed)
Patient transported to X-ray 

## 2014-07-16 NOTE — ED Notes (Signed)
Patient is aware we need urine 

## 2014-07-16 NOTE — ED Notes (Signed)
FLEETS GIVEN AT [phone]
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
## 2014-07-16 NOTE — Discharge Instructions (Signed)

## 2014-08-21 DIAGNOSIS — K219 Gastro-esophageal reflux disease without esophagitis: Secondary | ICD-10-CM

## 2014-08-21 HISTORY — DX: Gastro-esophageal reflux disease without esophagitis: K21.9

## 2014-08-22 ENCOUNTER — Ambulatory Visit: Payer: Medicaid Other | Admitting: Family Medicine

## 2014-08-23 ENCOUNTER — Other Ambulatory Visit: Payer: Self-pay | Admitting: Family Medicine

## 2014-08-23 NOTE — Telephone Encounter (Signed)
Pt missed appt yesterday, she hurt her back at work. Tried rescheduling her appt but there are no more openings for June, wants to know if she can get a refill on her klonopin until she can get an appt. Pt is on the waiting list in case someone cancels and she will call back to schedule for July once it's available.

## 2014-08-23 NOTE — Telephone Encounter (Signed)
Can someone else see this patient next week?  I know my appts are limited this month and in early July.    I would like for her to be seen, because we are trying to d/c Klonopin and increase celexa for anxiety.  Let me know if this is not possible.   Erasmo DownerAngela M Bacigalupo, MD, MPH PGY-1,  [facility] 08/23/2014 4:57 PM

## 2014-08-26 NOTE — Telephone Encounter (Signed)
Pt informed and scheduled to see dr Adriana Simascook. Deseree Bruna PotterBlount, CMA

## 2014-08-27 ENCOUNTER — Ambulatory Visit: Payer: Medicaid Other | Admitting: Family Medicine

## 2014-08-29 ENCOUNTER — Encounter: Payer: Self-pay | Admitting: Family Medicine

## 2014-08-29 ENCOUNTER — Ambulatory Visit (INDEPENDENT_AMBULATORY_CARE_PROVIDER_SITE_OTHER): Payer: Self-pay | Admitting: Family Medicine

## 2014-08-29 VITALS — BP 123/75 | HR 75 | Temp 98.2°F | Ht 64.0 in | Wt 180.1 lb

## 2014-08-29 DIAGNOSIS — F411 Generalized anxiety disorder: Secondary | ICD-10-CM

## 2014-08-29 DIAGNOSIS — R51 Headache: Secondary | ICD-10-CM

## 2014-08-29 DIAGNOSIS — K219 Gastro-esophageal reflux disease without esophagitis: Secondary | ICD-10-CM

## 2014-08-29 DIAGNOSIS — G8929 Other chronic pain: Secondary | ICD-10-CM

## 2014-08-29 MED ORDER — ACETAMINOPHEN-CODEINE #3 300-30 MG PO TABS: 1.0000 | ORAL_TABLET | Freq: Four times a day (QID) | ORAL | Status: DC | PRN

## 2014-08-29 MED ORDER — RANITIDINE HCL 150 MG PO TABS: 150.0000 mg | ORAL_TABLET | Freq: Two times a day (BID) | ORAL | Status: DC

## 2014-08-29 MED ORDER — CLONAZEPAM 0.5 MG PO TABS: 0.5000 mg | ORAL_TABLET | Freq: Two times a day (BID) | ORAL | Status: DC

## 2014-08-29 MED ORDER — PANTOPRAZOLE SODIUM 40 MG PO TBEC: 40.0000 mg | DELAYED_RELEASE_TABLET | Freq: Every day | ORAL | Status: DC

## 2014-08-29 NOTE — Assessment & Plan Note (Signed)
A: Stable on Celexa and Klonopin. Dose of Klonopin decreased to 0.5 mg BID a few months ago by Dr. Beryle Flock. Pt reports she feels both medications work for her, and reports she and Dr. B want to eventually stop the Klonopin altogether. However, she feels she's not ready to do that, yet. Denies active SI / HI or AH / VH.  P: Continue Celexa and Klonopin; refilled Klonopin 0.5 mg BID, #60, with no refills and advised pt to f/u with PCP Dr. B in about 1 month to either further taper Klonopin or discontinue, as appropriate.

## 2014-08-29 NOTE — Patient Instructions (Signed)
Thank you for coming in, today!  I'm glad you're doing well. I will refill your Klonopin, Tylenol #3, Zantac, and Protonix. I didn't change any doses or scheduling. Come back to see Dr. B as you need. I would say come back in about a month to touch base with her.  Please feel free to call with any questions or concerns at any time, at [phone]. --Dr. Casper Harrison
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
## 2014-08-29 NOTE — Assessment & Plan Note (Signed)
A: Overall much improved from a few months ago, with Protonix and Zantac; expect most improvement is likely secondary to stopping excessive use of Excedrin for chronic headaches. History and exam reassuring today.  P: Continue Protonix and Zantac; both refilled today. F/u with PCP as needed. Consider GI referral in the future if needed (no apparent indication, currently -- see office note by Dr. Casper Harrison in Feb 2016).

## 2014-08-29 NOTE — Assessment & Plan Note (Addendum)
A: Suspected multifactorial headaches (per chart review, likely tension type +/- migraine with probable strong component of medication overuse, especially of Excedrin, which has now been stopped). Stable / improved and greatly helped per report with Tylenol #3, of which pt has had no more pills for several weeks to a couple of months. Pt also reportedly having benefit from Flexeril. Neuro exam grossly normal today and history unremarkable.  P: Continue Tylenol #3 and Flexeril PRN; new Rx for Tylenol #3 given today. Advised f/u with PCP on a PRN basis, and explained that Dr. Leonard Schwartz may or may not want to continue Tylenol #3 in the future or may want to consider different options.

## 2014-11-07 ENCOUNTER — Telehealth: Payer: Self-pay | Admitting: Family Medicine

## 2014-11-07 NOTE — Telephone Encounter (Signed)
Needs refill on clonazepam Please advise

## 2014-11-07 NOTE — Telephone Encounter (Signed)
Patient needs to be seen in clinic for anxiety follow-up before refill will be given.  She was supposed to be seen 1 month ago.  Trying to uptitrate Celexa and d/c Klonopin.  Erasmo Downer, MD, MPH PGY-2,  [facility] 11/07/2014 1:17 PM

## 2014-11-12 ENCOUNTER — Emergency Department (HOSPITAL_BASED_OUTPATIENT_CLINIC_OR_DEPARTMENT_OTHER): Payer: Medicaid Other

## 2014-11-12 ENCOUNTER — Encounter (HOSPITAL_BASED_OUTPATIENT_CLINIC_OR_DEPARTMENT_OTHER): Payer: Self-pay

## 2014-11-12 ENCOUNTER — Emergency Department (HOSPITAL_BASED_OUTPATIENT_CLINIC_OR_DEPARTMENT_OTHER)
Admission: EM | Admit: 2014-11-12 | Discharge: 2014-11-12 | Disposition: A | Discharge status: Home / Self Care | Payer: Medicaid Other | Attending: Emergency Medicine | Admitting: Emergency Medicine

## 2014-11-12 DIAGNOSIS — F419 Anxiety disorder, unspecified: Secondary | ICD-10-CM | POA: Insufficient documentation

## 2014-11-12 DIAGNOSIS — Z91040 Latex allergy status: Secondary | ICD-10-CM | POA: Insufficient documentation

## 2014-11-12 DIAGNOSIS — Z8742 Personal history of other diseases of the female genital tract: Secondary | ICD-10-CM | POA: Insufficient documentation

## 2014-11-12 DIAGNOSIS — J029 Acute pharyngitis, unspecified: Secondary | ICD-10-CM | POA: Insufficient documentation

## 2014-11-12 DIAGNOSIS — F329 Major depressive disorder, single episode, unspecified: Secondary | ICD-10-CM | POA: Insufficient documentation

## 2014-11-12 DIAGNOSIS — Z79899 Other long term (current) drug therapy: Secondary | ICD-10-CM | POA: Insufficient documentation

## 2014-11-12 DIAGNOSIS — Z87891 Personal history of nicotine dependence: Secondary | ICD-10-CM | POA: Insufficient documentation

## 2014-11-12 LAB — RAPID STREP SCREEN (MED CTR MEBANE ONLY): STREPTOCOCCUS, GROUP A SCREEN (DIRECT): NEGATIVE

## 2014-11-12 MED ORDER — DEXAMETHASONE 4 MG PO TABS
ORAL_TABLET | ORAL | Status: AC
  Filled 2014-11-12: qty 3

## 2014-11-12 MED ORDER — DEXAMETHASONE 4 MG PO TABS
10.0000 mg | ORAL_TABLET | Freq: Once | ORAL | Status: AC
  Administered 2014-11-12: 10 mg via ORAL
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
## 2014-11-12 NOTE — ED Notes (Signed)
C/o head congestion, sore throat, laryngitis x 1.5 weeks

## 2014-11-12 NOTE — Discharge Instructions (Signed)
Upper Respiratory Infection, Adult An upper respiratory infection (URI) is also sometimes known as the common cold. The upper respiratory tract includes the nose, sinuses, throat, trachea, and bronchi. Bronchi are the airways leading to the lungs. Most people improve within 1 week, but symptoms can last up to 2 weeks. A residual cough may last even longer.  CAUSES Many different viruses can infect the tissues lining the upper respiratory tract. The tissues become irritated and inflamed and often become very moist. Mucus production is also common. A cold is contagious. You can easily spread the virus to others by oral contact. This includes kissing, sharing a glass, coughing, or sneezing. Touching your mouth or nose and then touching a surface, which is then touched by another person, can also spread the virus. SYMPTOMS  Symptoms typically develop 1 to 3 days after you come in contact with a cold virus. Symptoms vary from person to person. They may include:  Runny nose.  Sneezing.  Nasal congestion.  Sinus irritation.  Sore throat.  Loss of voice (laryngitis).  Cough.  Fatigue.  Muscle aches.  Loss of appetite.  Headache.  Low-grade fever. DIAGNOSIS  You might diagnose your own cold based on familiar symptoms, since most people get a cold 2 to 3 times a year. Your caregiver can confirm this based on your exam. Most importantly, your caregiver can check that your symptoms are not due to another disease such as strep throat, sinusitis, pneumonia, asthma, or epiglottitis. Blood tests, throat tests, and X-rays are not necessary to diagnose a common cold, but they may sometimes be helpful in excluding other more serious diseases. Your caregiver will decide if any further tests are required. RISKS AND COMPLICATIONS  You may be at risk for a more severe case of the common cold if you smoke cigarettes, have chronic heart disease (such as heart failure) or lung disease (such as asthma), or if  you have a weakened immune system. The very young and very old are also at risk for more serious infections. Bacterial sinusitis, middle ear infections, and bacterial pneumonia can complicate the common cold. The common cold can worsen asthma and chronic obstructive pulmonary disease (COPD). Sometimes, these complications can require emergency medical care and may be life-threatening. PREVENTION  The best way to protect against getting a cold is to practice good hygiene. Avoid oral or hand contact with people with cold symptoms. Wash your hands often if contact occurs. There is no clear evidence that vitamin C, vitamin E, echinacea, or exercise reduces the chance of developing a cold. However, it is always recommended to get plenty of rest and practice good nutrition. TREATMENT  Treatment is directed at relieving symptoms. There is no cure. Antibiotics are not effective, because the infection is caused by a virus, not by bacteria. Treatment may include:  Increased fluid intake. Sports drinks offer valuable electrolytes, sugars, and fluids.  Breathing heated mist or steam (vaporizer or shower).  Eating chicken soup or other clear broths, and maintaining good nutrition.  Getting plenty of rest.  Using gargles or lozenges for comfort.  Controlling fevers with ibuprofen or acetaminophen as directed by your caregiver.  Increasing usage of your inhaler if you have asthma. Zinc gel and zinc lozenges, taken in the first 24 hours of the common cold, can shorten the duration and lessen the severity of symptoms. Pain medicines may help with fever, muscle aches, and throat pain. A variety of non-prescription medicines are available to treat congestion and runny nose. Your caregiver   can make recommendations and may suggest nasal or lung inhalers for other symptoms.  HOME CARE INSTRUCTIONS   Only take over-the-counter or prescription medicines for pain, discomfort, or fever as directed by your  caregiver.  Use a warm mist humidifier or inhale steam from a shower to increase air moisture. This may keep secretions moist and make it easier to breathe.  Drink enough water and fluids to keep your urine clear or pale yellow.  Rest as needed.  Return to work when your temperature has returned to normal or as your caregiver advises. You may need to stay home longer to avoid infecting others. You can also use a face mask and careful hand washing to prevent spread of the virus. SEEK MEDICAL CARE IF:   After the first few days, you feel you are getting worse rather than better.  You need your caregiver's advice about medicines to control symptoms.  You develop chills, worsening shortness of breath, or brown or red sputum. These may be signs of pneumonia.  You develop yellow or brown nasal discharge or pain in the face, especially when you bend forward. These may be signs of sinusitis.  You develop a fever, swollen neck glands, pain with swallowing, or white areas in the back of your throat. These may be signs of strep throat. SEEK IMMEDIATE MEDICAL CARE IF:   You have a fever.  You develop severe or persistent headache, ear pain, sinus pain, or chest pain.  You develop wheezing, a prolonged cough, cough up blood, or have a change in your usual mucus (if you have chronic lung disease).  You develop sore muscles or a stiff neck. Document Released: 09/01/2000 Document Revised: 05/31/2011 Document Reviewed: 06/13/2013 ExitCare Patient Information 2015 [employer]. This information is not intended to replace advice given to you by your health care provider. Make sure you discuss any questions you have with your health care provider.  

## 2014-11-12 NOTE — Telephone Encounter (Signed)
lmovm for callback.  When she returns call please advise that appointment will be needed. Fleeger, Maryjo Rochester

## 2014-11-14 LAB — CULTURE, GROUP A STREP: STREP A CULTURE: NEGATIVE

## 2014-12-03 ENCOUNTER — Ambulatory Visit (INDEPENDENT_AMBULATORY_CARE_PROVIDER_SITE_OTHER): Payer: Self-pay | Admitting: Family Medicine

## 2014-12-03 ENCOUNTER — Encounter: Payer: Self-pay | Admitting: Family Medicine

## 2014-12-03 VITALS — BP 123/62 | HR 78 | Temp 98.1°F | Ht 64.0 in | Wt 183.8 lb

## 2014-12-03 DIAGNOSIS — R51 Headache: Secondary | ICD-10-CM

## 2014-12-03 DIAGNOSIS — L720 Epidermal cyst: Secondary | ICD-10-CM | POA: Insufficient documentation

## 2014-12-03 DIAGNOSIS — F419 Anxiety disorder, unspecified: Secondary | ICD-10-CM

## 2014-12-03 DIAGNOSIS — J309 Allergic rhinitis, unspecified: Secondary | ICD-10-CM

## 2014-12-03 DIAGNOSIS — G8929 Other chronic pain: Secondary | ICD-10-CM

## 2014-12-03 DIAGNOSIS — F411 Generalized anxiety disorder: Secondary | ICD-10-CM

## 2014-12-03 MED ORDER — SULFAMETHOXAZOLE-TRIMETHOPRIM 800-160 MG PO TABS: 1.0000 | ORAL_TABLET | Freq: Two times a day (BID) | ORAL | Status: DC

## 2014-12-03 MED ORDER — CITALOPRAM HYDROBROMIDE 20 MG PO TABS: 20.0000 mg | ORAL_TABLET | Freq: Every day | ORAL | Status: DC

## 2014-12-03 MED ORDER — FLUTICASONE PROPIONATE 50 MCG/ACT NA SUSP: 2.0000 | Freq: Every day | NASAL | Status: DC

## 2014-12-03 MED ORDER — CLONAZEPAM 0.5 MG PO TABS: 0.5000 mg | ORAL_TABLET | Freq: Two times a day (BID) | ORAL | Status: DC

## 2014-12-03 NOTE — Progress Notes (Signed)
   Subjective:   Heidi [name] is a 34 y.o. female with a history of anxiety, multiple dermoid inclusion cysts, headaches here for anxiety, headaches, cyst in left axilla.  Anxiety - would like to increase klonopin back to  BID from 0.5gm BID - has been on worker's comp since June, then was fired - stressed 2/2 job loss, etc - emotionally eating - stopped taking celexa 2 months ago, but denies SEs - thinks that it was helping when she was taking it - Denies SI/HI  HAs - taking Tylenol #3 daily - getting rebound headaches - tension type with occipital and neck bag and facial pressure - also has congestion and allergic rhinitis contributing - denies vision changes  Cyst - Patient has history of multiple inclusion cysts requiring surgical excision in axilla and breasts - First noticed cyst in left axilla last week and has been growing - Tender to touch, redness starting yesterday, no fevers or chills - Does not have a head and has not had any pus expressed  Review of Systems:  Per HPI. All other systems reviewed and are negative.   PMH, PSH, Medications, Allergies, and FmHx reviewed and updated in EMR.  Objective:  BP 123/62 mmHg  Pulse 78  Temp(Src) 98.1 F (36.7 C) (Oral)  Ht  (1.626 m)  Wt 183 lb 12.8 oz (83.371 kg)  BMI 31.53 kg/m2  LMP 11/12/2014  Gen:  34 y.o. female in NAD HEENT: NCAT, MMM, EOMI, PERRL, anicteric sclerae CV: RRR, no MRG Resp: Non-labored, CTAB, no wheezes noted Ext: WWP, no edema, 1.5cm fluctuant cyst in L axilla, overlying erythema, TTP MSK: Full ROM of neck, TTP and tightness of neck muscles Neuro: Alert and oriented, speech normal    Assessment & Plan:     Heidi [name] is a 34 y.o. female here for anxiety, cyst, HAs  No problem-specific assessment & plan notes found for this encounter.      Erasmo Downer, MD MPH PGY-2,  [facility] 12/03/2014  3:04 PM

## 2014-12-03 NOTE — Assessment & Plan Note (Signed)
Restart Celexa 20 mg daily with plan to titrate to 40 mg daily at next visit Continue Klonopin 0.5 mg twice a day with plan to discontinue if patient stable on SSRI follow-up in one month

## 2014-12-03 NOTE — Assessment & Plan Note (Signed)
Add daily Flonase to help with headaches and nasal congestion  continue Zyrtec

## 2014-12-03 NOTE — Assessment & Plan Note (Addendum)
Headaches likely multifactorial with tension, rebound secondary to medication overuse, and anxiety contributing Neuro exam benign Discontinue Tylenol 3 Advised patient to avoid Tylenol, Excedrin, NSAIDs for 1-2 weeks Occipital release performed in clinic and talk patient to do this at home Follow-up as needed

## 2014-12-03 NOTE — Patient Instructions (Signed)
Nice to see you again today. Restart the Celexa for your anxiety. Using continue take the Klonopin while you take this. Come back to see me in 2 weeks at which time we will increase the dose of Celexa. We need to keep working on stopping the Klonopin.  Please take Bactrim twice daily for the next 7 days for the cyst. I will refer you to Gen. surgery for removal.  Take care, Dr. Leonard Schwartz

## 2014-12-03 NOTE — Assessment & Plan Note (Signed)
Consistent with inclusion cyst the patient has had previously Appeared infected Offered to I&D clinic today the patient refused Referral to general surgery for excision Bactrim twice a day 7 days

## 2014-12-17 ENCOUNTER — Telehealth: Payer: Self-pay | Admitting: *Deleted

## 2014-12-17 NOTE — Telephone Encounter (Signed)
Pt states she is calling for [facility]

## 2015-01-16 ENCOUNTER — Telehealth: Payer: Self-pay | Admitting: Family Medicine

## 2015-01-16 ENCOUNTER — Ambulatory Visit (INDEPENDENT_AMBULATORY_CARE_PROVIDER_SITE_OTHER): Payer: Medicaid Other | Admitting: Family Medicine

## 2015-01-16 ENCOUNTER — Other Ambulatory Visit: Payer: Self-pay | Admitting: Family Medicine

## 2015-01-16 VITALS — BP 124/78 | HR 79 | Temp 98.6°F | Wt 181.6 lb

## 2015-01-16 DIAGNOSIS — J069 Acute upper respiratory infection, unspecified: Secondary | ICD-10-CM

## 2015-01-16 MED ORDER — GUAIFENESIN-DM 100-10 MG/5ML PO SYRP: 5.0000 mL | ORAL_SOLUTION | ORAL | Status: DC | PRN

## 2015-01-16 MED ORDER — BENZONATATE 200 MG PO CAPS
200.0000 mg | ORAL_CAPSULE | Freq: Three times a day (TID) | ORAL | Status: DC | PRN
Start: 2015-01-16 — End: 2015-08-26

## 2015-01-16 NOTE — Telephone Encounter (Signed)
Having difficulty swallowing pills now.  Need medication sent in for liquid instead.

## 2015-01-16 NOTE — Patient Instructions (Addendum)
Upper Respiratory Infection, Adult Most upper respiratory infections (URIs) are a viral infection of the air passages leading to the lungs. A URI affects the nose, throat, and upper air passages. The most common type of URI is nasopharyngitis and is typically referred to as "the common cold." URIs run their course and usually go away on their own. Most of the time, a URI does not require medical attention, but sometimes a bacterial infection in the upper airways can follow a viral infection. This is called a secondary infection. Sinus and middle ear infections are common types of secondary upper respiratory infections. Bacterial pneumonia can also complicate a URI. A URI can worsen asthma and chronic obstructive pulmonary disease (COPD). Sometimes, these complications can require emergency medical care and may be life threatening.  CAUSES Almost all URIs are caused by viruses. A virus is a type of germ and can spread from one person to another.  RISKS FACTORS You may be at risk for a URI if:   You smoke.   You have chronic heart or lung disease.  You have a weakened defense (immune) system.   You are very young or very old.   You have nasal allergies or asthma.  You work in crowded or poorly ventilated areas.  You work in health care facilities or schools. SIGNS AND SYMPTOMS  Symptoms typically develop 2-3 days after you come in contact with a cold virus. Most viral URIs last 7-10 days. However, viral URIs from the influenza virus (flu virus) can last 14-18 days and are typically more severe. Symptoms may include:   Runny or stuffy (congested) nose.   Sneezing.   Cough.   Sore throat.   Headache.   Fatigue.   Fever.   Loss of appetite.   Pain in your forehead, behind your eyes, and over your cheekbones (sinus pain).  Muscle aches.  DIAGNOSIS  Your health care provider may diagnose a URI by:  Physical exam.  Tests to check that your symptoms are not due to  another condition such as:  Strep throat.  Sinusitis.  Pneumonia.  Asthma. TREATMENT  A URI goes away on its own with time. It cannot be cured with medicines, but medicines may be prescribed or recommended to relieve symptoms. Medicines may help:  Reduce your fever.  Reduce your cough.  Relieve nasal congestion. HOME CARE INSTRUCTIONS   Take medicines only as directed by your health care provider.   Gargle warm saltwater or take cough drops to comfort your throat as directed by your health care provider.  Use a warm mist humidifier or inhale steam from a shower to increase air moisture. This may make it easier to breathe.  Drink enough fluid to keep your urine clear or pale yellow.   Eat soups and other clear broths and maintain good nutrition.   Rest as needed.   Return to work when your temperature has returned to normal or as your health care provider advises. You may need to stay home longer to avoid infecting others. You can also use a face mask and careful hand washing to prevent spread of the virus.  Increase the usage of your inhaler if you have asthma.   Do not use any tobacco products, including cigarettes, chewing tobacco, or electronic cigarettes. If you need help quitting, ask your health care provider. PREVENTION  The best way to protect yourself from getting a cold is to practice good hygiene.   Avoid oral or hand contact with people with cold   symptoms.   Wash your hands often if contact occurs.  There is no clear evidence that vitamin C, vitamin E, echinacea, or exercise reduces the chance of developing a cold. However, it is always recommended to get plenty of rest, exercise, and practice good nutrition.  SEEK MEDICAL CARE IF:   You are getting worse rather than better.   Your symptoms are not controlled by medicine.   You have chills.  You have worsening shortness of breath.  You have brown or red mucus.  You have yellow or brown nasal  discharge.  You have pain in your face, especially when you bend forward.  You have a fever.  You have swollen neck glands.  You have pain while swallowing.  You have white areas in the back of your throat. SEEK IMMEDIATE MEDICAL CARE IF:   You have severe or persistent:  Headache.  Ear pain.  Sinus pain.  Chest pain.  You have chronic lung disease and any of the following:  Wheezing.  Prolonged cough.  Coughing up blood.  A change in your usual mucus.  You have a stiff neck.  You have changes in your:  Vision.  Hearing.  Thinking.  Mood. MAKE SURE YOU:   Understand these instructions.  Will watch your condition.  Will get help right away if you are not doing well or get worse.   This information is not intended to replace advice given to you by your health care provider. Make sure you discuss any questions you have with your health care provider.   Document Released: 09/01/2000 Document Revised: 07/23/2014 Document Reviewed: 06/13/2013 Elsevier Interactive Patient Education 2016 Elsevier Inc.  

## 2015-01-16 NOTE — Telephone Encounter (Signed)
Patient should be seen in clinic for difficulty swallowing.  This may need further evaluation or work-up.  Erasmo DownerAngela M Bacigalupo, MD, MPH PGY-2,  [facility] 01/16/2015 5:05 PM

## 2015-01-16 NOTE — Telephone Encounter (Signed)
Pt. Called stating that she was prescribed Tessalon perles earlier today. She says that she has never been able to swallow pills since she was a child. She has never learned. She can only take liquid medications. She was given a rx for Robitussin DM at her clinic visit. She says that she was not aware of this. I refilled the Robitussin DM and sent it to her pharmacy. Patient appreciative.   Devota Pacealeb Chelly Dombeck, MD Family Medicine - PGY 2

## 2015-01-16 NOTE — Telephone Encounter (Signed)
Entered in error.   CGM MD 

## 2015-01-16 NOTE — Progress Notes (Signed)
    Subjective   Heidi [name] is a 34 y.o. female that presents for a same day visit  1. Cough: Symptoms stared two weeks ago. She has some rhinorrhea with occasional headache. No sneezing or fevers, but has some chills. No nausea, vomiting, diarrhea or constipation. No sick contacts. She has used Theraflu, Mucinex and Bactrim which have not helped.   ROS Per HPI  Social History  Substance Use Topics  . Smoking status: Former Smoker    Quit date: 12/20/2009  . Smokeless tobacco: Never Used  . Alcohol Use: 1.2 oz/week    2 Cans of beer per week     Comment: occ    Allergies  Allergen Reactions  . Morphine And Related Hives and Shortness Of Breath  . Latex   . Prozac [Fluoxetine Hcl]     SI  . Valium Other (See Comments)    "does not do well with me"    Objective   BP 124/78 mmHg  Pulse 79  Temp(Src) 98.6 F (37 C) (Oral)  Wt 181 lb 9.6 oz (82.373 kg)  SpO2 99%  LMP 01/15/2015 (Exact Date)  General: Well appearing HEENT: Pupils equal and reactive to light/accomodation. Extraocular movements intact bilaterally. Tympanic membranes normal bilaterally. Nares patent bilaterally, slightly edematous mucosa. Oropharnx clear and moist. No cervical adenopathy bilaterally  Assessment and Plan    URI  Tessalon perles BID prn for cough  Explained disease course  Follow-up if symptoms fail to improve in 5-7 days  Tylenol OTC prn for pain

## 2015-01-21 ENCOUNTER — Other Ambulatory Visit: Payer: Self-pay | Admitting: Family Medicine

## 2015-01-21 DIAGNOSIS — F419 Anxiety disorder, unspecified: Secondary | ICD-10-CM

## 2015-01-21 MED ORDER — CITALOPRAM HYDROBROMIDE 20 MG PO TABS: 20.0000 mg | ORAL_TABLET | Freq: Every day | ORAL | Status: DC

## 2015-01-21 NOTE — Telephone Encounter (Signed)
As discussed with patient previously, she will need follow-up appointment before refills of klonopin may be given.  We are in process of discontinuing klonopin as we increase celexa.  Please have patient make appt. Thanks!  Erasmo DownerAngela M Jashaun Penrose, MD, MPH PGY-2,   Family Medicine 01/21/2015 1:50 PM

## 2015-01-21 NOTE — Telephone Encounter (Signed)
Needs refill on clonzapam and celexa [facility] [address]

## 2015-01-21 NOTE — Telephone Encounter (Signed)
Completed. See additional phone note dated 10/27.

## 2015-01-21 NOTE — Telephone Encounter (Signed)
Follow up scheduled for 11/9 with PCP.

## 2015-01-22 NOTE — Telephone Encounter (Signed)
Pt calling about this request. Informed pt of below and pt asked if PCP would refill for enough to get her through to her appointment. Thank you, Dorothey BasemanSadie Reynolds, ASA

## 2015-01-23 MED ORDER — CLONAZEPAM 0.5 MG PO TABS: 0.5000 mg | ORAL_TABLET | Freq: Two times a day (BID) | ORAL | Status: DC

## 2015-01-23 NOTE — Addendum Note (Signed)
Addended by: Erasmo DownerBACIGALUPO, ANGELA M on: 01/23/2015 01:32 PM   Modules accepted: Orders

## 2015-01-23 NOTE — Telephone Encounter (Signed)
Rx for enough to last to appt at front desk ready for pick up.  Please let patient know.  In the future, she should plan ahead and make appt before refills are needed of controlled substances.   Erasmo DownerAngela M Trygve Thal, MD, MPH PGY-2,  [facility] 01/23/2015 1:31 PM

## 2015-01-29 ENCOUNTER — Ambulatory Visit: Payer: Medicaid Other | Admitting: Family Medicine

## 2015-02-03 ENCOUNTER — Telehealth: Payer: Self-pay | Admitting: Family Medicine

## 2015-02-03 NOTE — Telephone Encounter (Addendum)
Family Medicine After hours phone call  Patient calls asking for advice with constipation. She states she has not gone since last Tuesday and is starting to feel a little uncomfortable. She says she tried a capful of miralax last night and today but still hasn't gone. She asks if she can do an enema or magnesium citrate which she says works for her. I advised her that she could do either of these things if she has them on hand, or do an increased dose of miralax. She had no further questions.  Tawni CarnesAndrew Meira Wahba, MD 02/03/2015, 8:50 PM PGY-3, [facility]

## 2015-03-28 ENCOUNTER — Telehealth: Payer: Self-pay | Admitting: Family Medicine

## 2015-03-28 NOTE — Telephone Encounter (Signed)
Pt called and wanted to speak to the nurse about a personal issue down below. jw

## 2015-03-28 NOTE — Telephone Encounter (Signed)
Return call to patient regarding personal issue.  Patient stated she was seen last year for bladder prolapse.  [facility] and advised to follow up in one year.  Patient stated that her girlfriend felt like a small ball in her vaginal area.  Patient denies any problems with voiding at this time.  If she has concerns to schedule an appointment.  Patient does not get off until 5 PM today, appointment made for Monday 03/31/15 at 9 AM with Dr. Pollie MeyerMcIntyre.  Clovis PuMartin, Tamika L, RN

## 2015-03-31 ENCOUNTER — Ambulatory Visit: Payer: Medicaid Other | Admitting: Family Medicine

## 2015-04-03 ENCOUNTER — Encounter: Payer: Self-pay | Admitting: *Deleted

## 2015-04-03 ENCOUNTER — Telehealth: Payer: Self-pay | Admitting: *Deleted

## 2015-04-03 ENCOUNTER — Ambulatory Visit (INDEPENDENT_AMBULATORY_CARE_PROVIDER_SITE_OTHER): Payer: Self-pay | Admitting: Family Medicine

## 2015-04-03 ENCOUNTER — Encounter: Payer: Self-pay | Admitting: Family Medicine

## 2015-04-03 VITALS — BP 125/75 | HR 81 | Temp 98.1°F | Wt 181.0 lb

## 2015-04-03 DIAGNOSIS — N8110 Cystocele, unspecified: Secondary | ICD-10-CM

## 2015-04-03 DIAGNOSIS — R399 Unspecified symptoms and signs involving the genitourinary system: Secondary | ICD-10-CM

## 2015-04-03 LAB — POCT URINALYSIS DIPSTICK
BILIRUBIN UA: NEGATIVE
GLUCOSE UA: NEGATIVE
Ketones, UA: NEGATIVE
LEUKOCYTES UA: NEGATIVE
Nitrite, UA: NEGATIVE
Protein, UA: NEGATIVE
RBC UA: NEGATIVE
Spec Grav, UA: 1.025
Urobilinogen, UA: 0.2
pH, UA: 6

## 2015-04-03 NOTE — Patient Instructions (Signed)
Everything looks fine. Your bladder is a little bit down but not worse than it has been before. We can refer you to a new gynecologist if you'd like. Just let us know.  Be well, Dr. Pollie MeyerMcIntyre   Pelvic Organ Prolapse Pelvic organ prolapse is the stretching, bulging, or dropping of pelvic organs into an abnormal position. It happens when the muscles and tissues that surround and support pelvic structures are stretched or weak. Pelvic organ prolapse can involve:  Vagina (vaginal prolapse).  Uterus (uterine prolapse).  Bladder (cystocele).  Rectum (rectocele).  Intestines (enterocele). When organs other than the vagina are involved, they often bulge into the vagina or protrude from the vagina, depending on how severe the prolapse is. CAUSES Causes of this condition include:  Pregnancy, labor, and childbirth.  Long-lasting (chronic) cough.  Chronic constipation.  Obesity.  Past pelvic surgery.  Aging. During and after menopause, a decreased production of the hormone estrogen can weaken pelvic ligaments and muscles.  Consistently lifting more than 50 lb (23 kg).  Buildup of fluid in the abdomen due to certain diseases and other conditions. SYMPTOMS Symptoms of this condition include:  Loss of bladder control when you cough, sneeze, strain, and exercise (stress incontinence). This may be worse immediately following childbirth, and it may gradually improve over time.  Feeling pressure in your pelvis or vagina. This pressure may increase when you cough or when you are having a bowel movement.  A bulge that protrudes from the opening of your vagina or against your vaginal wall. If your uterus protrudes through the opening of your vagina and rubs against your clothing, you may also experience soreness, ulcers, infection, pain, and bleeding.  Increased effort to have a bowel movement or urinate.  Pain in your low back.  Pain, discomfort, or disinterest in sexual  intercourse.  Repeated bladder infections (urinary tract infections).  Difficulty inserting or inability to insert a tampon or applicator. In some people, this condition does not cause any symptoms. DIAGNOSIS Your health care provider may perform an internal and external vaginal and rectal exam. During the exam, you may be asked to cough and strain while you are lying down, sitting, and standing up. Your health care provider will determine if other tests are required, such as bladder function tests. TREATMENT In most cases, this condition needs to be treated only if it produces symptoms. No treatment is guaranteed to correct the prolapse or relieve the symptoms completely. Treatment may include:  Lifestyle changes, such as:  Avoiding drinking beverages that contain caffeine.  Increasing your intake of high-fiber foods. This can help to decrease constipation and straining during bowel movements.  Emptying your bladder at scheduled times (bladder training therapy). This can help to reduce or avoid urinary incontinence.  Losing weight if you are overweight or obese.  Estrogen. Estrogen may help mild prolapse by increasing the strength and tone of pelvic floor muscles.  Kegel exercises. These may help mild cases of prolapse by strengthening and tightening the muscles of the pelvic floor.  Pessary insertion. A pessary is a soft, flexible device that is placed into your vagina by your health care provider to help support the vaginal walls and keep pelvic organs in place.  Surgery. This is often the only form of treatment for severe prolapse. Different types of surgeries are available. HOME CARE INSTRUCTIONS  Wear a sanitary pad or absorbent product if you have urinary incontinence.  Avoid heavy lifting and straining with exercise and work. Do not hold your  breath when you perform mild to moderate lifting and exercise activities. Limit your activities as directed by your health care  provider.  Take medicines only as directed by your health care provider.  Perform Kegel exercises as directed by your health care provider.  If you have a pessary, take care of it as directed by your health care provider. SEEK MEDICAL CARE IF:  Your symptoms interfere with your daily activities or sex life.  You need medicine to help with the discomfort.  You notice bleeding from the vagina that is not related to your period.  You have a fever.  You have pain or bleeding when you urinate.  You have bleeding when you have a bowel movement.  You lose urine when you have sex.  You have chronic constipation.  You have a pessary that falls out.  You have vaginal discharge that has a bad smell.  You have low abdominal pain or cramping that is unusual for you.   This information is not intended to replace advice given to you by your health care provider. Make sure you discuss any questions you have with your health care provider.   Document Released: 10/03/2013 Document Reviewed: 10/03/2013 Elsevier Interactive Patient Education [employer]

## 2015-04-03 NOTE — Telephone Encounter (Signed)
Refill request for tylenol 3 previously prescribed by Dr. Casper HarrisonStreet for headaches.

## 2015-04-03 NOTE — Telephone Encounter (Signed)
Tylenol 3 is not good treatment for chronic headaches.  Please have her make an appointment to discuss medication options.  She can take Tylenol or NSAIDs in the mean time. Thanks!  Erasmo DownerAngela M Kallista Pae, MD, MPH PGY-2,  [facility] 04/03/2015 10:48 AM

## 2015-04-04 NOTE — Progress Notes (Signed)
Date of Visit: 04/03/2015   HPI:  Patient presents to discuss bladder prolapse. Has history of grade 1 cystocele in the past, [facility]. Patient has noted some pressure in her bladder, also slightly darker urine and stronger smell to urine. Denies incontinence, fever, or dysuria, just notes feeling of pressure. No vaginal discharge. Denies being concerned for STD's. Is sexually active with her girlfriend, and states girlfriend has noticed her bladder dropping. They are about to go on a trip to [place] this weekend and patient wants to be checked before they go to be sure everything is okay.  ROS: See HPI.  PMFSH: history of anemia, anxiety, bipolar, depression, GERD, herpes, ovarian cyst  PHYSICAL EXAM: BP 125/75 mmHg  Pulse 81  Temp(Src) 98.1 F (36.7 C) (Oral)  Wt 181 lb (82.101 kg)  LMP 03/15/2015 Gen: NAD, pleasant, cooperative GU: normal appearing external genitalia without lesions. Vagina is moist with white discharge. Cervix normal in appearance. No cervical motion tenderness or tenderness on bimanual exam. No adnexal masses. Very mild bladder prolapse noted.   ASSESSMENT/PLAN:  Bladder prolapse, female, acquired Urinalysis unremarkable, as is exam. No severe prolapse of bladder on exam. Offered reassurance to patient. Will refer back to GYN for ongoing management.    FOLLOW UP: Referring to GYN  GrenadaBrittany J. Pollie MeyerMcIntyre, MD [facility]

## 2015-04-05 NOTE — Assessment & Plan Note (Signed)
Urinalysis unremarkable, as is exam. No severe prolapse of bladder on exam. Offered reassurance to patient. Will refer back to GYN for ongoing management.

## 2015-04-08 NOTE — Telephone Encounter (Signed)
Patient informed, will call back to schedule an appointment. 

## 2015-04-09 ENCOUNTER — Encounter: Payer: Self-pay | Admitting: Obstetrics & Gynecology

## 2015-05-07 ENCOUNTER — Telehealth: Payer: Self-pay | Admitting: Family Medicine

## 2015-05-07 ENCOUNTER — Encounter: Payer: Self-pay | Admitting: Obstetrics & Gynecology

## 2015-05-07 ENCOUNTER — Ambulatory Visit (INDEPENDENT_AMBULATORY_CARE_PROVIDER_SITE_OTHER): Payer: Self-pay | Admitting: Obstetrics & Gynecology

## 2015-05-07 VITALS — BP 117/80 | HR 72 | Ht 64.0 in | Wt 183.9 lb

## 2015-05-07 DIAGNOSIS — Z09 Encounter for follow-up examination after completed treatment for conditions other than malignant neoplasm: Secondary | ICD-10-CM

## 2015-05-07 DIAGNOSIS — N329 Bladder disorder, unspecified: Secondary | ICD-10-CM

## 2015-05-07 NOTE — Progress Notes (Signed)
   Subjective:    Patient ID: Heidi [name], female    DOB: [birth year], 35 y.o.   MRN: [id number]  HPI 35 yo S P2 (12 and 52 yo kids) here today for a "recheck" on her bladder. She is having no problems exept "a little pressure" in her vagina when on the treadmill.  She did not see a urologist. The GSUI has resolved. She is doing Engineer, technical sales.   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 Her pap was normal with negative HPV 2015 RTC 2018 for pap/prn sooner

## 2015-05-07 NOTE — Telephone Encounter (Signed)
Pt is calling to speak to Dr. Beryle Flock about her medications . jw

## 2015-05-08 NOTE — Telephone Encounter (Signed)
Called patient back.  Patient wants refills on Klonopin and Tylenol 3, but she has not been seen for headaches or anxiety in almost 6 months.  Advised patient to make f/u appt to discuss options.  Patient agreeable to plan.  Erasmo Downer, MD, MPH PGY-2,  [facility] 05/08/2015 8:30 AM

## 2015-05-13 ENCOUNTER — Ambulatory Visit: Payer: Medicaid Other | Admitting: Family Medicine

## 2015-08-26 ENCOUNTER — Encounter: Payer: Self-pay | Admitting: Family Medicine

## 2015-08-26 ENCOUNTER — Ambulatory Visit (INDEPENDENT_AMBULATORY_CARE_PROVIDER_SITE_OTHER): Payer: Self-pay | Admitting: Family Medicine

## 2015-08-26 VITALS — BP 122/74 | HR 77 | Temp 97.6°F | Ht 64.0 in | Wt 185.0 lb

## 2015-08-26 DIAGNOSIS — G44229 Chronic tension-type headache, not intractable: Secondary | ICD-10-CM

## 2015-08-26 DIAGNOSIS — F411 Generalized anxiety disorder: Secondary | ICD-10-CM

## 2015-08-26 DIAGNOSIS — E669 Obesity, unspecified: Secondary | ICD-10-CM

## 2015-08-26 MED ORDER — ACETAMINOPHEN-CODEINE #3 300-30 MG PO TABS: 1.0000 | ORAL_TABLET | Freq: Three times a day (TID) | ORAL | Status: DC | PRN

## 2015-08-26 MED ORDER — PROPRANOLOL HCL ER 80 MG PO CP24: 80.0000 mg | ORAL_CAPSULE | Freq: Every day | ORAL | Status: DC

## 2015-08-26 NOTE — Assessment & Plan Note (Signed)
Currently well controlled On any medications Continue to monitor

## 2015-08-26 NOTE — Progress Notes (Signed)
   Subjective:   Heidi [name] is a 35 y.o. female with a history of Anxiety, headaches here for Anxiety and headache follow-up.  Anxiety - Denies SI/HI - working full time - loves her job - trying to get a house - stopped klonopin many months ago - using self-taught behavioral interventions instead - stopped celexa as well - reports anxiety is very well controlled - No mania symptoms or depressive symptoms  HAs - previously taking Tylenol #3 daily - which worked well - taking ibuprofen, excedrin - didn't work - imitrex didn't work - headaches now qod - last 2-3 hours - wants to get vision checked because glasses rx may be out of date - headache is above b/l eyes - throbbing pain - never tried preventive medication for headaches  - Reports she is going to the gym and still trying to lose weight  Review of Systems:  Per HPI.   Social History: former smoker  Objective:  BP 122/74 mmHg  Pulse 77  Temp(Src) 97.6 F (36.4 C) (Oral)  Ht 5\' 4"  (1.626 m)  Wt 185 lb (83.915 kg)  BMI 31.74 kg/m2  LMP 08/12/2015  Gen:  35 y.o. female in NAD HEENT: NCAT, MMM, EOMI, PERRL, anicteric sclerae CV: RRR, no MRG, no JVD Resp: Non-labored, CTAB, no wheezes noted Ext: WWP, no edema MSK: No obvious deformities Neuro: Alert and oriented, speech normal, CNs grossly intact, gait intact     Assessment & Plan:     Heidi [name] is a 35 y.o. female here for  Chronic headache Mostly tension-type, but throbbing and location could be consistent with some migrainous symptoms Limited supply of Tylenol 3 given for abortion Attempt prophylaxis with propranolol 80 mg daily Plan to increase at next visit if it is decreasing frequency and duration of headaches Follow-up in one month No neurologic symptoms  Anxiety state Currently well controlled On any medications Continue to monitor  Obesity Encourage continued work on diet and exercise    Erasmo DownerAngela M [name], MD MPH PGY-2,   [facility] 08/26/2015  3:48 PM

## 2015-08-26 NOTE — Assessment & Plan Note (Signed)
Encourage continued work on diet and exercise

## 2015-08-26 NOTE — Assessment & Plan Note (Signed)
Mostly tension-type, but throbbing and location could be consistent with some migrainous symptoms Limited supply of Tylenol 3 given for abortion Attempt prophylaxis with propranolol 80 mg daily Plan to increase at next visit if it is decreasing frequency and duration of headaches Follow-up in one month No neurologic symptoms

## 2015-08-26 NOTE — Patient Instructions (Addendum)
Nice to see you again today. Start taking propranolol daily to help decrease the number of headaches you have. You can use Tylenol 3 as needed. You can also use ibuprofen intermittently as it is safe to take with the Tylenol 3.  Take care, Dr. B  Tension Headache A tension headache is a feeling of pain, pressure, or aching that is often felt over the front and sides of the head. The pain can be dull, or it can feel tight (constricting). Tension headaches are not normally associated with nausea or vomiting, and they do not get worse with physical activity. Tension headaches can last from 30 minutes to several days. This is the most common type of headache. CAUSES The exact cause of this condition is not known. Tension headaches often begin after stress, anxiety, or depression. Other triggers may include:  Alcohol.  Too much caffeine, or caffeine withdrawal.  Respiratory infections, such as colds, flu, or sinus infections.  Dental problems or teeth clenching.  Fatigue.  Holding your head and neck in the same position for a long period of time, such as while using a computer.  Smoking. SYMPTOMS Symptoms of this condition include:  A feeling of pressure around the head.  Dull, aching head pain.  Pain felt over the front and sides of the head.  Tenderness in the muscles of the head, neck, and shoulders. DIAGNOSIS This condition may be diagnosed based on your symptoms and a physical exam. Tests may be done, such as a CT scan or an MRI of your head. These tests may be done if your symptoms are severe or unusual. TREATMENT This condition may be treated with lifestyle changes and medicines to help relieve symptoms. HOME CARE INSTRUCTIONS Managing Pain  Take over-the-counter and prescription medicines only as told by your health care provider.  Lie down in a dark, quiet room when you have a headache.  If directed, apply ice to the head and neck area:  Put ice in a plastic  bag.  Place a towel between your skin and the bag.  Leave the ice on for 20 minutes, 2-3 times per day.  Use a heating pad or a hot shower to apply heat to the head and neck area as told by your health care provider. Eating and Drinking  Eat meals on a regular schedule.  Limit alcohol use.  Decrease your caffeine intake, or stop using caffeine. General Instructions  Keep all follow-up visits as told by your health care provider. This is important.  Keep a headache journal to help find out what may trigger your headaches. For example, write down:  What you eat and drink.  How much sleep you get.  Any change to your diet or medicines.  Try massage or other relaxation techniques.  Limit stress.  Sit up straight, and avoid tensing your muscles.  Do not use tobacco products, including cigarettes, chewing tobacco, or e-cigarettes. If you need help quitting, ask your health care provider.  Exercise regularly as told by your health care provider.  Get 7-9 hours of sleep, or the amount recommended by your health care provider. SEEK MEDICAL CARE IF:  Your symptoms are not helped by medicine.  You have a headache that is different from what you normally experience.  You have nausea or you vomit.  You have a fever. SEEK IMMEDIATE MEDICAL CARE IF:  Your headache becomes severe.  You have repeated vomiting.  You have a stiff neck.  You have a loss of vision.  You have problems with speech.  You have pain in your eye or ear.  You have muscular weakness or loss of muscle control.  You lose your balance or you have trouble walking.  You feel faint or you pass out.  You have confusion.   This information is not intended to replace advice given to you by your health care provider. Make sure you discuss any questions you have with your health care provider.   Document Released: 03/08/2005 Document Revised: 11/27/2014 Document Reviewed: 07/01/2014 Elsevier  Interactive Patient Education [employer]

## 2015-11-18 ENCOUNTER — Other Ambulatory Visit: Payer: Self-pay | Admitting: Family Medicine

## 2015-11-18 MED ORDER — PROPRANOLOL HCL ER 80 MG PO CP24: ORAL_CAPSULE | ORAL | 1 refills | Status: DC

## 2015-11-18 NOTE — Telephone Encounter (Signed)
Fill request for Tylenol #3 and propranolol. Please advise.

## 2015-11-21 ENCOUNTER — Other Ambulatory Visit: Payer: Self-pay | Admitting: *Deleted

## 2015-11-21 MED ORDER — POLYETHYLENE GLYCOL 3350 17 GM/SCOOP PO POWD: ORAL | 0 refills | Status: DC

## 2015-11-21 NOTE — Telephone Encounter (Signed)
Patient is requesting a refill on Miralax.  She is having constipation problems and Miralax helps.  Advise patient if the medication does not help, severe abd cramping or notice blood in stool to call for an appointment or go to ED.  Clovis PuMartin, Rodrigues Urbanek L, RN

## 2016-02-24 ENCOUNTER — Telehealth: Payer: Self-pay | Admitting: Family Medicine

## 2016-02-24 NOTE — Telephone Encounter (Signed)
Family Medicine After hours phone call  Patient is calling asking about possible cause of some vaginal irritation/swelling that she has. She states that she has noticed fairly consistent "inflammation" of herbal while after intercourse with her girlfriend. She is wondering if there is any particular advice that I have in preventing this from happening. I informed her that it was difficult to fully assess without specific details lots of inflammation could be occurring due to an allergic response to an item or substance, or it could be irritation for many trauma/physical activity performed during intercourse. Patient I had a long discussion about some of her symptoms and it appears as though patient may be having more of an allergic response just from the information I received over the phone. We discussed eliminating one item/substance from intercourse at a time in order to determine the possible cause. I informed her that as long as she is not having a full body response or any prolonged inflammation or irritation in this could be safely done. We discussed precautions which would suggest direction to be ED for further evaluation. Patient thanked me for this advice and had no further questions.  Kathee DeltonIan D McKeag, MD,MS,  PGY3 02/24/2016 9:17 PM

## 2016-02-24 NOTE — Telephone Encounter (Signed)
Family Medicine After hours phone call  Received age on emergency call line. Called patient's number back. No answer and no available voicemail (full).  Kathee DeltonIan D McKeag, MD,MS,  PGY3 02/24/2016 8:59 PM

## 2016-04-14 ENCOUNTER — Ambulatory Visit: Payer: BLUE CROSS/BLUE SHIELD | Admitting: Family Medicine

## 2016-04-14 NOTE — Progress Notes (Deleted)
   Subjective:   Heidi [name] is a 36 y.o. female with a history of *** here for same day appt for  No chief complaint on file.   ***  Review of Systems:  Per HPI.   Social History: *** smoker  Objective:  There were no vitals taken for this visit.  Gen:  36 y.o. female in NAD *** HEENT: NCAT, MMM, EOMI, PERRL, anicteric sclerae CV: RRR, no MRG, no JVD Resp: Non-labored, CTAB, no wheezes noted Abd: Soft, NTND, BS present, no guarding or organomegaly Ext: WWP, no edema MSK: Full ROM, strength intact Neuro: Alert and oriented, speech normal       Chemistry      Component Value Date/Time   NA 140 07/12/2013 1144   K 4.0 07/12/2013 1144   CL 108 07/12/2013 1144   CO2 25 07/12/2013 1144   BUN 13 07/12/2013 1144   CREATININE 0.78 07/12/2013 1144      Component Value Date/Time   CALCIUM 8.8 07/12/2013 1144   ALKPHOS 49 07/12/2013 1144   AST 14 07/12/2013 1144   ALT 8 07/12/2013 1144   BILITOT 0.3 07/12/2013 1144      Lab Results  Component Value Date   WBC 6.2 12/05/2012   HGB 11.7 (L) 12/05/2012   HCT 36.0 12/05/2012   MCV 62.7 (L) 12/05/2012   PLT 243 12/05/2012   Lab Results  Component Value Date   TSH 0.641 12/05/2012   No results found for: HGBA1C Assessment & Plan:     Heidi [name] is a 36 y.o. female here for ***  No problem-specific Assessment & Plan notes found for this encounter.     Erasmo DownerAngela M Marlaina Coburn, MD MPH PGY-3,  [facility] 04/14/2016  3:10 PM

## 2016-04-26 ENCOUNTER — Ambulatory Visit (INDEPENDENT_AMBULATORY_CARE_PROVIDER_SITE_OTHER): Payer: BLUE CROSS/BLUE SHIELD | Admitting: Family Medicine

## 2016-04-26 ENCOUNTER — Encounter: Payer: Self-pay | Admitting: Family Medicine

## 2016-04-26 ENCOUNTER — Telehealth: Payer: Self-pay | Admitting: Family Medicine

## 2016-04-26 ENCOUNTER — Ambulatory Visit: Payer: BLUE CROSS/BLUE SHIELD | Admitting: Internal Medicine

## 2016-04-26 VITALS — BP 126/80 | HR 91 | Temp 98.7°F | Ht 64.0 in | Wt 184.6 lb

## 2016-04-26 DIAGNOSIS — J111 Influenza due to unidentified influenza virus with other respiratory manifestations: Secondary | ICD-10-CM

## 2016-04-26 DIAGNOSIS — J069 Acute upper respiratory infection, unspecified: Secondary | ICD-10-CM | POA: Diagnosis not present

## 2016-04-26 LAB — INFLUENZA PANEL BY PCR (TYPE A & B)
INFLBPCR: NEGATIVE
Influenza A By PCR: POSITIVE — AB

## 2016-04-26 MED ORDER — OSELTAMIVIR PHOSPHATE 75 MG PO CAPS: 75.0000 mg | ORAL_CAPSULE | Freq: Two times a day (BID) | ORAL | 0 refills | Status: DC

## 2016-04-26 MED ORDER — BENZONATATE 100 MG PO CAPS: 100.0000 mg | ORAL_CAPSULE | Freq: Two times a day (BID) | ORAL | 0 refills | Status: DC | PRN

## 2016-04-26 MED ORDER — DEXTROMETHORPHAN-GUAIFENESIN 10-200 MG/5ML PO LIQD: 5.0000 mL | Freq: Two times a day (BID) | ORAL | 1 refills | Status: DC | PRN

## 2016-04-26 NOTE — Telephone Encounter (Signed)
Routing to doctor who saw pt. Zimmerman Rumple, April D, CMA  

## 2016-04-26 NOTE — Patient Instructions (Signed)
Thank you so much for coming to visit today! I have sent prescriptions for Tamiflu and Tessalon to the pharmacy. A work note has also been given.  Dr. Caroleen Hammanumley

## 2016-04-26 NOTE — Telephone Encounter (Signed)
Pt is calling because we called in tessalon pearls, but she needs the liquid form instead. Can we change this for her. jw

## 2016-04-26 NOTE — Telephone Encounter (Signed)
Informed pt that Rx had been changed. Heidi [name], Riaan Toledo D, [place]

## 2016-04-27 NOTE — Progress Notes (Signed)
Subjective:     Patient ID: Heidi [name], female   DOB: February 06, 1981, 36 y.o.   MRN: [id number]  HPI Heidi [name] is a 36yo female presenting with cold-like symptoms. Reports symptoms started abruptly on Saturday 04/24/16. Notes cough and chest congestion, which she reports she normally receives a Z Pack for. Also notes body aches (all over, especially her back and legs), chills, sore throat, headache. Unsure if she has a fever since she does not have a thermometer at home. Notes a coworker had a fever at work a few days ago. Denies diarrhea, constipation, nausea, and vomiting. Works for the [employer]. Has been using Mucinex with some relief. Requests cough medication. Has not had flu shot and not interested in having this done.  Review of Systems Per HPI    Objective:   Physical Exam  Constitutional: She appears well-developed and well-nourished. No distress.  HENT:  Head: Normocephalic and atraumatic.  Mouth/Throat: Oropharynx is clear and moist.  Left and right tympanic membrane normal. Middle ear appears normal.  Cardiovascular: Normal rate and regular rhythm.   No murmur heard. Pulmonary/Chest: Effort normal. No respiratory distress. She has no wheezes.  Abdominal: Soft. She exhibits no distension. There is no tenderness.  Lymphadenopathy:    She has no cervical adenopathy.  Psychiatric: She has a normal mood and affect. Her behavior is normal.       Assessment and Plan:     Influenza with Respiratory Manifestation Flu positive with screen. Right at cut off for Tamiflu (symptoms started Saturday)--course of Tamiflu initiated. Tessalon initially prescribed--once patient arrived to pharmacy she decided she would like a liquid cough suppressant instead. Dextromethorphan-Guaifenesin sent to pharmacy. Discussed that some cases of flu have been severe this year resulting in hospitalization. Appears stable at this time. If symptoms worsen or shortness of breath develops,  recommend presenting to ED for further evaluation. Work note given. Recommend face mask, hand washing.

## 2016-04-28 ENCOUNTER — Telehealth: Payer: Self-pay | Admitting: Family Medicine

## 2016-04-28 NOTE — Telephone Encounter (Signed)
Other 2 messages about this concern have been sent to Dr Caroleen Hammanumley who saw the patient.  Sorry to send this along too.  Did not see patient, but suspect tylenol or ibuprofen as well as lozenges or chloraseptic spray would be appropriate for sore throat related to viral process.  Erasmo DownerAngela M Bacigalupo, MD, MPH PGY-3,  [facility] 04/28/2016 7:26 PM

## 2016-04-28 NOTE — Telephone Encounter (Signed)
Patient was seen on Mon. 04/26/16. Patient ask if pcp could call her something in for sore throat. Please let patient know if this can be done. [phone].  Pharmacy - [facility] on [address].

## 2016-04-28 NOTE — Telephone Encounter (Signed)
2nd request. ep °

## 2016-04-28 NOTE — Telephone Encounter (Signed)
Pt was seen by Dr. Caroleen Hammanumley the other day, pt's throat is still very sore and would like to have something called in for it. Pt states she is still taking the Tamiflu. Pt uses [facility] on [address]. ep

## 2016-04-28 NOTE — Telephone Encounter (Signed)
Will forward to Dr Caroleen Hammanumley who saw the patient.  Erasmo DownerAngela M Rhaya Coale, MD, MPH PGY-3,  [facility] 04/28/2016 2:16 PM

## 2016-04-28 NOTE — Telephone Encounter (Signed)
Will forward to PCP.  Byron Tipping L, RN  

## 2016-04-29 NOTE — Telephone Encounter (Signed)
Pt informed. Sharon T Saunders, CMA  

## 2016-04-29 NOTE — Telephone Encounter (Signed)
Agree with Dr. Beryle FlockBacigalupo. Would recommend over the counter cough drops and Chloraseptic spray. May also try honey as needed in warm tea or water.

## 2016-05-03 NOTE — Telephone Encounter (Signed)
Pt states that she has taken the whole bottle of robitussin and still has the cough.  States that the cough is deep "like a bronchial cough" and would like something else called in to help.  Advised I would send a message to Dr. Caroleen Hammanumley. Kelie Gainey, Maryjo RochesterJessica Dawn, CMA

## 2016-05-05 MED ORDER — DEXTROMETHORPHAN-GUAIFENESIN 10-200 MG/5ML PO LIQD: 5.0000 mL | Freq: Two times a day (BID) | ORAL | 1 refills | Status: DC | PRN

## 2016-05-05 NOTE — Telephone Encounter (Signed)
Robitussin refilled

## 2016-08-12 ENCOUNTER — Encounter: Payer: Self-pay | Admitting: Family Medicine

## 2016-08-12 ENCOUNTER — Ambulatory Visit (INDEPENDENT_AMBULATORY_CARE_PROVIDER_SITE_OTHER): Payer: BLUE CROSS/BLUE SHIELD | Admitting: Family Medicine

## 2016-08-12 DIAGNOSIS — N611 Abscess of the breast and nipple: Secondary | ICD-10-CM | POA: Insufficient documentation

## 2016-08-12 MED ORDER — FLUCONAZOLE 150 MG PO TABS: 150.0000 mg | ORAL_TABLET | Freq: Once | ORAL | 0 refills | Status: AC

## 2016-08-12 MED ORDER — DOXYCYCLINE HYCLATE 100 MG PO TABS: 100.0000 mg | ORAL_TABLET | Freq: Two times a day (BID) | ORAL | 0 refills | Status: AC

## 2016-08-12 NOTE — Patient Instructions (Signed)
Nice to see you again today.  I am referring you to surgery. You should hear about this within the next week or 2. Take doxycycline twice daily for the next 7 days. Finish the entire course. Please seek medical care if there is redness surrounding the abscess that seems to spread or worsen, you develop fevers, you are unable to use, or otherwise feel unwell.  Take care, Dr. BLeonard Schwartz

## 2016-08-12 NOTE — Progress Notes (Signed)
   Subjective:   Heidi [name] is a 36 y.o. female with a history of Bipolar disorder, anxiety, bladder prolapse, herpes genitalis, obesity here for  Chief Complaint  Patient presents with  . cyst on breast     Patient with previous breast abscesses that she reports having drained by general surgery. She has noticed one in her medial right breast around the 2 to 3:00 position that is growing in size and worsening over the last 2 weeks.  She denies any surrounding erythema, drainage, fevers, nausea, vomiting, abdominal pain.  Review of Systems:  Per HPI.   Social History: former smoker  Objective:  BP 120/72   Pulse 79   Temp 98 F (36.7 C) (Oral)   Ht 5\' 4"  (1.626 m)   Wt 186 lb 3.2 oz (84.5 kg)   SpO2 99%   BMI 31.96 kg/m   Gen:  36 y.o. female in NAD HEENT: NCAT, MMM, anicteric sclerae CV: RRR, no MRG Resp: Non-labored, CTAB, no wheezes noted Breasts: left breast normal without mass, skin or nipple changes or axillary nodes, surgical scars noted over her bilateral medial breasts, 1-2 cm indurated area in medial right breast at 2 to 3:00 position, no other masses, no skin or nipple changes, no axillary nodes. Ext: WWP, no edema MSK: No obvious deformities, gait intact Neuro: Alert and oriented, speech normal     Assessment & Plan:     Heidi [name] is a 36 y.o. female here for   Breast abscess Patient with recurrent breast abscesses I will treat for any infection with doxycycline twice daily for 7 days Referral to general surgery for drainage Return precautions discussed   Erasmo DownerBacigalupo, Latise Dilley M, MD MPH PGY-3,  [facility] 08/12/2016  11:47 AM

## 2016-08-12 NOTE — Assessment & Plan Note (Signed)
Patient with recurrent breast abscesses I will treat for any infection with doxycycline twice daily for 7 days Referral to general surgery for drainage Return precautions discussed

## 2016-09-24 ENCOUNTER — Encounter (HOSPITAL_COMMUNITY): Payer: Self-pay

## 2016-09-24 ENCOUNTER — Ambulatory Visit (HOSPITAL_COMMUNITY)
Admission: EM | Admit: 2016-09-24 | Discharge: 2016-09-24 | Disposition: A | Discharge status: Home / Self Care | Payer: BLUE CROSS/BLUE SHIELD | Attending: Internal Medicine | Admitting: Internal Medicine

## 2016-09-24 DIAGNOSIS — N6001 Solitary cyst of right breast: Secondary | ICD-10-CM | POA: Diagnosis not present

## 2016-09-24 MED ORDER — MUPIROCIN 2 % EX OINT: TOPICAL_OINTMENT | CUTANEOUS | 0 refills | Status: DC
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
## 2016-09-24 NOTE — ED Triage Notes (Signed)
Pt has a cyst on right breast and it did drain yesterday but pt states it is still tender and wanted to make sure it is all gone and wanted to make sure she didn't need antibiotics. No fever.

## 2016-11-11 ENCOUNTER — Telehealth: Payer: Self-pay | Admitting: Internal Medicine

## 2016-11-11 NOTE — Telephone Encounter (Signed)
**  After Hours/ Emergency Line Call*  Received a call to report that Heidi [name] . - reports of having a runny nose which she thinks is due to allergies - reports she has a snore inside of her nostril (bilaterally) and the scab comes off and then reheals - has been using Vaseline  - no nose bleeding, no pain - does not smoke - no respiratory distress or fevers.   Plan:  After discussed patient wants to see if this will self resolve and if she still has this next week, she will make an appointment.   Palma Holter, MD PGY-3, [facility]

## 2017-03-21 ENCOUNTER — Encounter (HOSPITAL_COMMUNITY): Payer: Self-pay | Admitting: Family Medicine

## 2017-03-21 ENCOUNTER — Ambulatory Visit (HOSPITAL_COMMUNITY)
Admission: EM | Admit: 2017-03-21 | Discharge: 2017-03-21 | Disposition: A | Discharge status: Home / Self Care | Payer: BLUE CROSS/BLUE SHIELD | Attending: Emergency Medicine | Admitting: Emergency Medicine

## 2017-03-21 DIAGNOSIS — K0889 Other specified disorders of teeth and supporting structures: Secondary | ICD-10-CM

## 2017-03-21 MED ORDER — HYDROCODONE-ACETAMINOPHEN 5-325 MG PO TABS: 1.0000 | ORAL_TABLET | ORAL | 0 refills | Status: DC | PRN

## 2017-03-21 MED ORDER — AMOXICILLIN 500 MG PO CAPS
1000.0000 mg | ORAL_CAPSULE | Freq: Two times a day (BID) | ORAL | 0 refills | Status: DC
Start: 2017-03-21 — End: 2017-04-12

## 2017-03-21 NOTE — Discharge Instructions (Addendum)
Medications as directed. May cause drowsiness. Take ibuprofen along with the medication. Follow-up with dentist as soon as possible
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
## 2017-03-21 NOTE — ED Triage Notes (Signed)
Pt here for dental pain. Left lower dental pain where her wisdom tooth was.

## 2017-04-12 ENCOUNTER — Other Ambulatory Visit: Payer: Self-pay

## 2017-04-12 ENCOUNTER — Encounter (HOSPITAL_COMMUNITY): Payer: Self-pay | Admitting: Emergency Medicine

## 2017-04-12 ENCOUNTER — Ambulatory Visit (HOSPITAL_COMMUNITY)
Admission: EM | Admit: 2017-04-12 | Discharge: 2017-04-12 | Disposition: A | Discharge status: Home / Self Care | Payer: Self-pay | Attending: Family Medicine | Admitting: Family Medicine

## 2017-04-12 DIAGNOSIS — J029 Acute pharyngitis, unspecified: Secondary | ICD-10-CM | POA: Insufficient documentation

## 2017-04-12 DIAGNOSIS — J039 Acute tonsillitis, unspecified: Secondary | ICD-10-CM

## 2017-04-12 MED ORDER — FLUCONAZOLE 200 MG PO TABS: 200.0000 mg | ORAL_TABLET | Freq: Every day | ORAL | 0 refills | Status: AC

## 2017-04-12 MED ORDER — AMOXICILLIN 500 MG PO CAPS: 500.0000 mg | ORAL_CAPSULE | Freq: Three times a day (TID) | ORAL | 0 refills | Status: DC

## 2017-04-12 NOTE — ED Triage Notes (Signed)
Sore throat started last night.  Also complains of runny nose, cough.  States she is a "carrier of strep".  Patient concerned for strep throat.  Unsure of fever, but woke in a profuse sweat
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
## 2017-04-15 LAB — CULTURE, GROUP A STREP (THRC)

## 2017-07-28 ENCOUNTER — Ambulatory Visit: Payer: Self-pay

## 2017-07-28 ENCOUNTER — Other Ambulatory Visit: Payer: Self-pay | Admitting: Occupational Medicine

## 2017-07-28 DIAGNOSIS — M545 Low back pain: Secondary | ICD-10-CM

## 2017-07-28 DIAGNOSIS — M542 Cervicalgia: Secondary | ICD-10-CM

## 2018-03-06 ENCOUNTER — Encounter (HOSPITAL_COMMUNITY): Payer: Self-pay

## 2018-03-06 ENCOUNTER — Other Ambulatory Visit: Payer: Self-pay

## 2018-03-06 ENCOUNTER — Emergency Department (HOSPITAL_COMMUNITY)
Admission: EM | Admit: 2018-03-06 | Discharge: 2018-03-06 | Disposition: A | Discharge status: Home / Self Care | Payer: Medicaid Other | Attending: Emergency Medicine | Admitting: Emergency Medicine

## 2018-03-06 DIAGNOSIS — Z87891 Personal history of nicotine dependence: Secondary | ICD-10-CM | POA: Insufficient documentation

## 2018-03-06 DIAGNOSIS — L732 Hidradenitis suppurativa: Secondary | ICD-10-CM | POA: Insufficient documentation

## 2018-03-06 DIAGNOSIS — Z7982 Long term (current) use of aspirin: Secondary | ICD-10-CM | POA: Insufficient documentation

## 2018-03-06 DIAGNOSIS — L0291 Cutaneous abscess, unspecified: Secondary | ICD-10-CM

## 2018-03-06 DIAGNOSIS — Z79899 Other long term (current) drug therapy: Secondary | ICD-10-CM | POA: Insufficient documentation

## 2018-03-06 DIAGNOSIS — L02213 Cutaneous abscess of chest wall: Secondary | ICD-10-CM | POA: Insufficient documentation

## 2018-03-06 MED ORDER — DOXYCYCLINE HYCLATE 100 MG PO CAPS: 100.0000 mg | ORAL_CAPSULE | Freq: Two times a day (BID) | ORAL | 0 refills | Status: DC

## 2018-03-06 MED ORDER — LIDOCAINE HCL 2 % IJ SOLN
20.0000 mL | Freq: Once | INTRAMUSCULAR | Status: AC
  Administered 2018-03-06: 400 mg
  Filled 2018-03-06: qty 20

## 2018-03-06 MED ORDER — TRAMADOL HCL 50 MG PO TABS: 50.0000 mg | ORAL_TABLET | Freq: Four times a day (QID) | ORAL | 0 refills | Status: DC | PRN

## 2018-03-06 NOTE — ED Triage Notes (Signed)
Patient c/o abscess on the sternum/right breast area x 3 days.

## 2018-03-06 NOTE — Discharge Instructions (Signed)
Return here as needed.  Follow-up with [facility].

## 2018-03-06 NOTE — Progress Notes (Signed)
Spoke with patient at bedside about history of recurrent abscesses. Patient reports seeing Dr. Maryagnes AmosLeone (now retired) from our office in the past. Multiple I&Ds in our office. We discussed the chronic nature of hidradenitis. The disease has affecter her breast, axilla, and groin. She has occasional flare ups at home that typically respond to warm compress and PO abx. I think the patient would benefit from bedside incision and drainage of small chest wall abscess with mild overlying erythema. If her disease progresses she may benefit from Dermatology referral to optimize medical management.   Adam PhenixElizabeth S Simaan, [facility] 03/06/2018, 11:51 AM Pager: [phone] Consults: [phone]
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
## 2018-04-20 ENCOUNTER — Ambulatory Visit: Payer: Self-pay | Admitting: Surgery
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
## 2018-04-25 ENCOUNTER — Encounter (HOSPITAL_BASED_OUTPATIENT_CLINIC_OR_DEPARTMENT_OTHER): Payer: Self-pay

## 2018-04-26 ENCOUNTER — Other Ambulatory Visit: Payer: Self-pay

## 2018-04-26 ENCOUNTER — Encounter (HOSPITAL_BASED_OUTPATIENT_CLINIC_OR_DEPARTMENT_OTHER): Payer: Self-pay

## 2018-04-26 NOTE — Progress Notes (Signed)
Spoke with: Marializ NPO:  After Midnight, no gum, candy, or mints   Arrival time: 0745AM Labs: UPT AM medications: None Pre op orders: Yes Ride home: Tabitha (significant other) [phone] Instructed to shower with Hibiclens the night before and day of surgery

## 2018-05-01 ENCOUNTER — Other Ambulatory Visit: Payer: Self-pay

## 2018-05-01 ENCOUNTER — Ambulatory Visit (HOSPITAL_BASED_OUTPATIENT_CLINIC_OR_DEPARTMENT_OTHER)
Admission: RE | Admit: 2018-05-01 | Discharge: 2018-05-01 | Disposition: A | Discharge status: Home / Self Care | Payer: Self-pay | Attending: Surgery | Admitting: Surgery

## 2018-05-01 ENCOUNTER — Encounter (HOSPITAL_BASED_OUTPATIENT_CLINIC_OR_DEPARTMENT_OTHER)
Admission: RE | Disposition: A | Discharge status: Home / Self Care | Payer: Self-pay | Source: Home / Self Care | Attending: Surgery

## 2018-05-01 ENCOUNTER — Encounter (HOSPITAL_BASED_OUTPATIENT_CLINIC_OR_DEPARTMENT_OTHER): Payer: Self-pay | Admitting: Anesthesiology

## 2018-05-01 ENCOUNTER — Ambulatory Visit (HOSPITAL_BASED_OUTPATIENT_CLINIC_OR_DEPARTMENT_OTHER): Payer: Self-pay | Admitting: Anesthesiology

## 2018-05-01 DIAGNOSIS — F419 Anxiety disorder, unspecified: Secondary | ICD-10-CM | POA: Insufficient documentation

## 2018-05-01 DIAGNOSIS — N739 Female pelvic inflammatory disease, unspecified: Secondary | ICD-10-CM | POA: Insufficient documentation

## 2018-05-01 DIAGNOSIS — F319 Bipolar disorder, unspecified: Secondary | ICD-10-CM | POA: Insufficient documentation

## 2018-05-01 DIAGNOSIS — Z888 Allergy status to other drugs, medicaments and biological substances status: Secondary | ICD-10-CM | POA: Insufficient documentation

## 2018-05-01 DIAGNOSIS — E669 Obesity, unspecified: Secondary | ICD-10-CM | POA: Insufficient documentation

## 2018-05-01 DIAGNOSIS — Z800 Family history of malignant neoplasm of digestive organs: Secondary | ICD-10-CM | POA: Insufficient documentation

## 2018-05-01 DIAGNOSIS — N6081 Other benign mammary dysplasias of right breast: Secondary | ICD-10-CM | POA: Insufficient documentation

## 2018-05-01 DIAGNOSIS — Z7951 Long term (current) use of inhaled steroids: Secondary | ICD-10-CM | POA: Insufficient documentation

## 2018-05-01 DIAGNOSIS — Z885 Allergy status to narcotic agent status: Secondary | ICD-10-CM | POA: Insufficient documentation

## 2018-05-01 DIAGNOSIS — Z79899 Other long term (current) drug therapy: Secondary | ICD-10-CM | POA: Insufficient documentation

## 2018-05-01 DIAGNOSIS — K219 Gastro-esophageal reflux disease without esophagitis: Secondary | ICD-10-CM | POA: Insufficient documentation

## 2018-05-01 DIAGNOSIS — Z6832 Body mass index (BMI) 32.0-32.9, adult: Secondary | ICD-10-CM | POA: Insufficient documentation

## 2018-05-01 DIAGNOSIS — Z91040 Latex allergy status: Secondary | ICD-10-CM | POA: Insufficient documentation

## 2018-05-01 HISTORY — DX: Anemia, unspecified: D64.9

## 2018-05-01 HISTORY — DX: Bipolar disorder, unspecified: F31.9

## 2018-05-01 HISTORY — DX: Allergic rhinitis, unspecified: J30.9

## 2018-05-01 HISTORY — DX: Obesity, unspecified: E66.9

## 2018-05-01 HISTORY — DX: Headache, unspecified: R51.9

## 2018-05-01 HISTORY — DX: Abscess of the breast and nipple: N61.1

## 2018-05-01 HISTORY — DX: Presence of spectacles and contact lenses: Z97.3

## 2018-05-01 HISTORY — DX: Gastro-esophageal reflux disease without esophagitis: K21.9

## 2018-05-01 HISTORY — DX: Cystocele, unspecified: N81.10

## 2018-05-01 HISTORY — DX: Herpesviral infection, unspecified: B00.9

## 2018-05-01 HISTORY — DX: Pilonidal cyst without abscess: L05.91

## 2018-05-01 HISTORY — DX: Follicular cyst of the skin and subcutaneous tissue, unspecified: L72.9

## 2018-05-01 HISTORY — PX: CYST EXCISION: SHX5701

## 2018-05-01 HISTORY — DX: Plantar fascial fibromatosis: M72.2

## 2018-05-01 HISTORY — DX: Headache: R51

## 2018-05-01 LAB — POCT PREGNANCY, URINE: Preg Test, Ur: NEGATIVE

## 2018-05-01 SURGERY — CYST REMOVAL
Anesthesia: Monitor Anesthesia Care | Site: Chest

## 2018-05-01 MED ORDER — OXYCODONE HCL 5 MG PO TABS
5.0000 mg | ORAL_TABLET | ORAL | Status: DC | PRN
  Filled 2018-05-01: qty 2

## 2018-05-01 MED ORDER — SODIUM CHLORIDE 0.9% FLUSH
3.0000 mL | Freq: Two times a day (BID) | INTRAVENOUS | Status: DC
  Filled 2018-05-01: qty 3

## 2018-05-01 MED ORDER — ONDANSETRON HCL 4 MG/2ML IJ SOLN
INTRAMUSCULAR | Status: DC | PRN
  Administered 2018-05-01: 4 mg via INTRAVENOUS

## 2018-05-01 MED ORDER — SODIUM CHLORIDE 0.9 % IV SOLN
250.0000 mL | INTRAVENOUS | Status: DC | PRN
  Filled 2018-05-01: qty 250

## 2018-05-01 MED ORDER — FENTANYL CITRATE (PF) 100 MCG/2ML IJ SOLN
INTRAMUSCULAR | Status: AC
  Filled 2018-05-01: qty 2

## 2018-05-01 MED ORDER — PROPOFOL 500 MG/50ML IV EMUL
INTRAVENOUS | Status: AC
  Filled 2018-05-01: qty 50

## 2018-05-01 MED ORDER — PROMETHAZINE HCL 25 MG/ML IJ SOLN
6.2500 mg | INTRAMUSCULAR | Status: DC | PRN
  Filled 2018-05-01: qty 1

## 2018-05-01 MED ORDER — PROPOFOL 10 MG/ML IV BOLUS
INTRAVENOUS | Status: DC | PRN
  Administered 2018-05-01: 20 mg via INTRAVENOUS
  Administered 2018-05-01: 40 mg via INTRAVENOUS
  Administered 2018-05-01 (×2): 20 mg via INTRAVENOUS

## 2018-05-01 MED ORDER — OXYCODONE HCL 5 MG PO TABS
5.0000 mg | ORAL_TABLET | Freq: Once | ORAL | Status: DC | PRN
  Filled 2018-05-01: qty 1

## 2018-05-01 MED ORDER — KETOROLAC TROMETHAMINE 30 MG/ML IJ SOLN
INTRAMUSCULAR | Status: AC
  Filled 2018-05-01: qty 1

## 2018-05-01 MED ORDER — ONDANSETRON HCL 4 MG/2ML IJ SOLN
INTRAMUSCULAR | Status: AC
  Filled 2018-05-01: qty 2

## 2018-05-01 MED ORDER — OXYCODONE HCL 5 MG/5ML PO SOLN
5.0000 mg | Freq: Once | ORAL | Status: DC | PRN
  Filled 2018-05-01: qty 5

## 2018-05-01 MED ORDER — SODIUM CHLORIDE 0.9% FLUSH
3.0000 mL | INTRAVENOUS | Status: DC | PRN
  Filled 2018-05-01: qty 3

## 2018-05-01 MED ORDER — KETOROLAC TROMETHAMINE 30 MG/ML IJ SOLN
INTRAMUSCULAR | Status: DC | PRN
  Administered 2018-05-01: 30 mg via INTRAVENOUS

## 2018-05-01 MED ORDER — LIDOCAINE 2% (20 MG/ML) 5 ML SYRINGE
INTRAMUSCULAR | Status: AC
  Filled 2018-05-01: qty 5

## 2018-05-01 MED ORDER — CEFAZOLIN SODIUM-DEXTROSE 2-4 GM/100ML-% IV SOLN
INTRAVENOUS | Status: AC
  Filled 2018-05-01: qty 100

## 2018-05-01 MED ORDER — FENTANYL CITRATE (PF) 100 MCG/2ML IJ SOLN
INTRAMUSCULAR | Status: DC | PRN
  Administered 2018-05-01 (×4): 25 ug via INTRAVENOUS

## 2018-05-01 MED ORDER — DEXAMETHASONE SODIUM PHOSPHATE 10 MG/ML IJ SOLN
INTRAMUSCULAR | Status: AC
  Filled 2018-05-01: qty 1

## 2018-05-01 MED ORDER — LACTATED RINGERS IV SOLN
INTRAVENOUS | Status: DC
  Administered 2018-05-01 (×2): via INTRAVENOUS
  Filled 2018-05-01: qty 1000

## 2018-05-01 MED ORDER — DEXAMETHASONE SODIUM PHOSPHATE 4 MG/ML IJ SOLN
INTRAMUSCULAR | Status: DC | PRN
  Administered 2018-05-01: 5 mg via INTRAVENOUS

## 2018-05-01 MED ORDER — ACETAMINOPHEN 650 MG RE SUPP
650.0000 mg | RECTAL | Status: DC | PRN
  Filled 2018-05-01: qty 1

## 2018-05-01 MED ORDER — ACETAMINOPHEN 500 MG PO TABS
ORAL_TABLET | ORAL | Status: AC
  Filled 2018-05-01: qty 2

## 2018-05-01 MED ORDER — ACETAMINOPHEN 500 MG PO TABS
1000.0000 mg | ORAL_TABLET | ORAL | Status: AC
  Administered 2018-05-01: 1000 mg via ORAL
  Filled 2018-05-01: qty 2

## 2018-05-01 MED ORDER — LIDOCAINE HCL (CARDIAC) PF 100 MG/5ML IV SOSY
PREFILLED_SYRINGE | INTRAVENOUS | Status: DC | PRN
  Administered 2018-05-01: 60 mg via INTRAVENOUS

## 2018-05-01 MED ORDER — CEPHALEXIN 500 MG PO CAPS: 500.0000 mg | ORAL_CAPSULE | Freq: Four times a day (QID) | ORAL | 0 refills | Status: AC

## 2018-05-01 MED ORDER — PROPOFOL 500 MG/50ML IV EMUL
INTRAVENOUS | Status: DC | PRN
  Administered 2018-05-01: 50 ug/kg/min via INTRAVENOUS

## 2018-05-01 MED ORDER — FENTANYL CITRATE (PF) 100 MCG/2ML IJ SOLN
25.0000 ug | INTRAMUSCULAR | Status: DC | PRN
  Administered 2018-05-01: 25 ug via INTRAVENOUS
  Filled 2018-05-01: qty 1

## 2018-05-01 MED ORDER — BUPIVACAINE HCL (PF) 0.25 % IJ SOLN
INTRAMUSCULAR | Status: DC | PRN
  Administered 2018-05-01: 5 mL

## 2018-05-01 MED ORDER — TRAMADOL HCL 50 MG PO TABS: 50.0000 mg | ORAL_TABLET | Freq: Four times a day (QID) | ORAL | 0 refills | Status: AC | PRN

## 2018-05-01 MED ORDER — HYDROMORPHONE HCL 1 MG/ML IJ SOLN
0.2500 mg | INTRAMUSCULAR | Status: DC | PRN
  Filled 2018-05-01: qty 0.5

## 2018-05-01 MED ORDER — PROPOFOL 10 MG/ML IV BOLUS
INTRAVENOUS | Status: AC
  Filled 2018-05-01: qty 20

## 2018-05-01 MED ORDER — DOCUSATE SODIUM 100 MG PO CAPS: 100.0000 mg | ORAL_CAPSULE | Freq: Two times a day (BID) | ORAL | 0 refills | Status: AC

## 2018-05-01 MED ORDER — GLYCOPYRROLATE 0.2 MG/ML IJ SOLN
INTRAMUSCULAR | Status: DC | PRN
  Administered 2018-05-01: 0.2 mg via INTRAVENOUS

## 2018-05-01 MED ORDER — CHLORHEXIDINE GLUCONATE 4 % EX LIQD
60.0000 mL | Freq: Once | CUTANEOUS | Status: DC
  Filled 2018-05-01: qty 118

## 2018-05-01 MED ORDER — GLYCOPYRROLATE PF 0.2 MG/ML IJ SOSY
PREFILLED_SYRINGE | INTRAMUSCULAR | Status: AC
  Filled 2018-05-01: qty 1

## 2018-05-01 MED ORDER — ACETAMINOPHEN 325 MG PO TABS
650.0000 mg | ORAL_TABLET | ORAL | Status: DC | PRN
  Filled 2018-05-01: qty 2

## 2018-05-01 MED ORDER — CEFAZOLIN SODIUM-DEXTROSE 2-4 GM/100ML-% IV SOLN
2.0000 g | INTRAVENOUS | Status: AC
  Administered 2018-05-01: 2 g via INTRAVENOUS
  Filled 2018-05-01: qty 100

## 2018-05-01 SURGICAL SUPPLY — 41 items
ADH SKN CLS APL DERMABOND .7 (GAUZE/BANDAGES/DRESSINGS) ×1
BLADE SURG 15 STRL LF DISP TIS (BLADE) ×1 IMPLANT
BLADE SURG 15 STRL SS (BLADE) ×2
COVER MAYO STAND STRL (DRAPES) ×2 IMPLANT
COVER TABLE BACK 60X90 (DRAPES) ×2 IMPLANT
COVER WAND RF STERILE (DRAPES) ×2 IMPLANT
DERMABOND ADVANCED (GAUZE/BANDAGES/DRESSINGS) ×1
DERMABOND ADVANCED .7 DNX12 (GAUZE/BANDAGES/DRESSINGS) ×1 IMPLANT
DRAPE LAPAROTOMY 100X72 PEDS (DRAPES) IMPLANT
DRAPE LAPAROTOMY TRNSV 102X78 (DRAPE) IMPLANT
DRAPE UTILITY XL STRL (DRAPES) ×2 IMPLANT
GAUZE SPONGE 4X4 12PLY STRL (GAUZE/BANDAGES/DRESSINGS) ×2 IMPLANT
GLOVE BIO SURGEON STRL SZ 6 (GLOVE) ×2 IMPLANT
GLOVE BIOGEL PI IND STRL 6.5 (GLOVE) IMPLANT
GLOVE BIOGEL PI IND STRL 7.0 (GLOVE) IMPLANT
GLOVE BIOGEL PI IND STRL 7.5 (GLOVE) IMPLANT
GLOVE BIOGEL PI INDICATOR 6.5 (GLOVE) ×1
GLOVE BIOGEL PI INDICATOR 7.0 (GLOVE) ×1
GLOVE BIOGEL PI INDICATOR 7.5 (GLOVE) ×1
GLOVE INDICATOR 6.5 STRL GRN (GLOVE) ×2 IMPLANT
GLOVE INDICATOR 7.0 STRL GRN (GLOVE) ×1 IMPLANT
GOWN STRL REUS W/TWL LRG LVL3 (GOWN DISPOSABLE) ×3 IMPLANT
NDL HYPO 25X1 1.5 SAFETY (NEEDLE) IMPLANT
NEEDLE HYPO 22GX1.5 SAFETY (NEEDLE) IMPLANT
NEEDLE HYPO 25X1 1.5 SAFETY (NEEDLE) IMPLANT
PACK BASIN DAY SURGERY FS (CUSTOM PROCEDURE TRAY) ×2 IMPLANT
PENCIL BUTTON HOLSTER BLD 10FT (ELECTRODE) ×2 IMPLANT
SPONGE LAP 18X18 RF (DISPOSABLE) IMPLANT
SPONGE LAP 4X18 RFD (DISPOSABLE) IMPLANT
STAPLER VISISTAT 35W (STAPLE) IMPLANT
SUCTION FRAZIER TIP 10 FR DISP (SUCTIONS) IMPLANT
SUT MNCRL AB 4-0 PS2 18 (SUTURE) ×2 IMPLANT
SUT VIC AB 2-0 SH 27 (SUTURE)
SUT VIC AB 2-0 SH 27XBRD (SUTURE) IMPLANT
SUT VIC AB 3-0 SH 27 (SUTURE) ×2
SUT VIC AB 3-0 SH 27X BRD (SUTURE) ×1 IMPLANT
SYR BULB IRRIGATION 50ML (SYRINGE) ×2 IMPLANT
SYR CONTROL 10ML LL (SYRINGE) ×2 IMPLANT
TOWEL OR 17X26 10 PK STRL BLUE (TOWEL DISPOSABLE) ×3 IMPLANT
TUBE CONNECTING 12X1/4 (SUCTIONS) ×1 IMPLANT
YANKAUER SUCT BULB TIP NO VENT (SUCTIONS) ×1 IMPLANT

## 2018-05-01 NOTE — Interval H&P Note (Signed)
History and Physical Interval Note:  05/01/2018 8:55 AM  Heidi [name]  has presented today for surgery, with the diagnosis of subcutaneous cyst with recurrent infection  The various methods of treatment have been discussed with the patient and family. After consideration of risks, benefits and other options for treatment, the patient has consented to  Procedure(s): EXCISION OF SUBCUTANEOUS CYST, MID-CHEST (N/A) as a surgical intervention .  The patient's history has been reviewed, patient examined, no change in status, stable for surgery.  I have reviewed the patient's chart and labs.  Questions were answered to the patient's satisfaction.     Savir Blanke Lollie Sails

## 2018-05-01 NOTE — Op Note (Signed)
Operative Note  Heidi [name]  [id number]  [id number]  05/01/2018   Surgeon: Vikki Ports A ConnorMD  Assistant: none  Procedure performed: excision of 2.5cm subcutaneous infected cyst, right medial breast  Preop diagnosis: subcutaneous cyst Post-op diagnosis/intraop findings: same  Specimens: subcutaneous cyst Retained items: no EBL: minimal cc Complications: none  Description of procedure: After obtaining informed consent the patient was taken to the operating room and placed supine on operating room table [facility] was initiated, preoperative antibiotics were administered, SCDs applied, and a formal timeout was performed. The mid chest and medial breast was prepped and draped in the usual sterile fashion. After infiltration with local a vertical elliptical incision around the chronic infected medial right breast subcutaneous cyst was made sharply and continued dissection with cautery was employed until the entire cyst wall with some surrounding healthy appearing fatty tissue was removed. This was sent for pathology. Hemostasis was ensured in the wound and the skin/subcutaneous tissue edges were undermined slightly on either side to reduce tension on the closure. The wound was irrigated and then closed with interrupted deep dermal 3-0 vicryl, running subcuticular 4-0 monocryl and dermabond. The patient was then awakened and taken to PACU in stable condition.   All counts were correct at the completion of the case.

## 2018-05-01 NOTE — Anesthesia Procedure Notes (Signed)
Procedure Name: MAC Date/Time: 05/01/2018 9:45 AM Performed by: Justice Rocher, CRNA Pre-anesthesia Checklist: Patient identified, Emergency Drugs available, Suction available, Patient being monitored and Timeout performed Patient Re-evaluated:Patient Re-evaluated prior to induction Oxygen Delivery Method: Simple face mask Preoxygenation: Pre-oxygenation with 100% oxygen Induction Type: IV induction Placement Confirmation: breath sounds checked- equal and bilateral and positive ETCO2

## 2018-05-01 NOTE — Anesthesia Postprocedure Evaluation (Signed)
Anesthesia Post Note  Patient: Jacinta Shoe  Procedure(s) Performed: EXCISION OF SUBCUTANEOUS CYST, MID-CHEST (N/A Chest)     Patient location during evaluation: PACU Anesthesia Type: MAC Level of consciousness: awake and alert Pain management: pain level controlled Vital Signs Assessment: post-procedure vital signs reviewed and stable Respiratory status: spontaneous breathing, nonlabored ventilation and respiratory function stable Cardiovascular status: stable and blood pressure returned to baseline Postop Assessment: no apparent nausea or vomiting Anesthetic complications: no    Last Vitals:  Vitals:   05/01/18 1045 05/01/18 1100  BP: 118/78 124/79  Pulse: 69 68  Resp: 12 10  Temp:    SpO2: 100% 98%    Last Pain:  Vitals:   05/01/18 1123  PainSc: [place] Ray Radley Barto

## 2018-05-01 NOTE — Transfer of Care (Signed)
Immediate Anesthesia Transfer of Care Note  Patient: Heidi [name]  Procedure(s) Performed: Procedure(s) (LRB): EXCISION OF SUBCUTANEOUS CYST, MID-CHEST (N/A)  Patient Location: PACU  Anesthesia Type: General  Level of Consciousness: awake, sedated, patient cooperative and responds to stimulation  Airway & Oxygen Therapy: Patient Spontanous Breathing and Patient stable on RA  Post-op Assessment: Report given to PACU RN, Post -op Vital signs reviewed and stable and Patient moving all extremities  Post vital signs: Reviewed and stable  Complications: No apparent anesthesia complications

## 2018-05-01 NOTE — Discharge Instructions (Signed)
Post Anesthesia Home Care Instructions  Activity: Get plenty of rest for the remainder of the day. A responsible individual must stay with you for 24 hours following the procedure.  For the next 24 hours, DO NOT: -Drive a car -Advertising copywriter -Drink alcoholic beverages -Take any medication unless instructed by your physician -Make any legal decisions or sign important papers.  Meals: Start with liquid foods such as gelatin or soup. Progress to regular foods as tolerated. Avoid greasy, spicy, heavy foods. If nausea and/or vomiting occur, drink only clear liquids until the nausea and/or vomiting subsides. Call your physician if vomiting continues.  Special Instructions/Symptoms: Your throat may feel dry or sore from the anesthesia or the breathing tube placed in your throat during surgery. If this causes discomfort, gargle with warm salt water. The discomfort should disappear within 24 hours.      GENERAL SURGERY: POST OP INSTRUCTIONS  ######################################################################  EAT Gradually transition to a high fiber diet with a fiber supplement over the next few weeks after discharge.  Start with a pureed / full liquid diet (see below)  WALK Walk an hour a day.  Control your pain to do that.    CONTROL PAIN Control pain so that you can walk, sleep, tolerate sneezing/coughing, go up/down stairs.  HAVE A BOWEL MOVEMENT DAILY Keep your bowels regular to avoid problems.  OK to try a laxative to override constipation.  OK to use an antidairrheal to slow down diarrhea.  Call if not better after 2 tries  CALL IF YOU HAVE PROBLEMS/CONCERNS Call if you are still struggling despite following these instructions. Call if you have concerns not answered by these instructions  ######################################################################    1. DIET: Follow a light bland diet the first 24 hours after arrival home, such as soup, liquids, crackers,  etc.  Be sure to include lots of fluids daily.  Avoid fast food or heavy meals as your are more likely to get nauseated.   2. Take your usually prescribed home medications unless otherwise directed. 3. PAIN CONTROL: a. Pain is best controlled by a usual combination of three different methods TOGETHER: i. Ice/Heat ii. Over the counter pain medication iii. Prescription pain medication b. Most patients will experience some swelling and bruising around the incisions.  Ice packs or heating pads (30-60 minutes up to 6 times a day) will help. Use ice for the first few days to help decrease swelling and bruising, then switch to heat to help relax tight/sore spots and speed recovery.  Some people prefer to use ice alone, heat alone, alternating between ice & heat.  Experiment to what works for you.  Swelling and bruising can take several weeks to resolve.   c. It is helpful to take an over-the-counter pain medication regularly for the first few weeks.  Choose one of the following that works best for you: i. Naproxen (Aleve, etc)  Two 220mg  tabs twice a day ii. Ibuprofen (Advil, etc) Three 200mg  tabs four times a day (every meal & bedtime) iii. Acetaminophen (Tylenol, etc) 500-650mg  four times a day (every meal & bedtime) d. A  prescription for pain medication (such as oxycodone, hydrocodone, etc) should be given to you upon discharge.  Take your pain medication as prescribed.  i. If you are having problems/concerns with the prescription medicine (does not control pain, nausea, vomiting, rash, itching, etc), please call us [phone] to see if we need to switch you to a different pain medicine that will work better for you and/or  control your side effect better. ii. If you need a refill on your pain medication, please contact your pharmacy.  They will contact our office to request authorization. Prescriptions will not be filled after 5 pm or on week-ends. 4. Avoid getting constipated.  Between the surgery  and the pain medications, it is common to experience some constipation.  Increasing fluid intake and taking a fiber supplement (such as Metamucil, Citrucel, FiberCon, MiraLax, etc) 1-2 times a day regularly will usually help prevent this problem from occurring.  A mild laxative (prune juice, Milk of Magnesia, MiraLax, etc) should be taken according to package directions if there are no bowel movements after 48 hours.   5. Wash / shower every day.  You may shower over the dressings as they are waterproof.  Continue to shower over incision(s) after the dressing is off. 6. Skin glue will flake off after 1-2 weeks.  You may leave the incision open to air.  You may have skin tapes (Steri Strips) covering the incision(s).  Leave them on until one week, then remove.  You may replace a dressing/Band-Aid to cover the incision for comfort if you wish.   Wearing a good support bra will help to reduce tension on the incision and help it heal. This will also reduce chances of fluid building up under the wound.   You may return to work as soon as you feel comfortable.     7. ACTIVITIES as tolerated:   a. You may resume regular (light) daily activities beginning the next day--such as daily self-care, walking, climbing stairs--gradually increasing activities as tolerated.  If you can walk 30 minutes without difficulty, it is safe to try more intense activity such as jogging, treadmill, bicycling, low-impact aerobics, swimming, etc. b. Save the most intensive and strenuous activity for last such as sit-ups, heavy lifting, contact sports, etc  Refrain from any heavy lifting or straining until you are off narcotics for pain control.   c. DO NOT PUSH THROUGH PAIN.  Let pain be your guide: If it hurts to do something, don't do it.  Pain is your body warning you to avoid that activity for another week until the pain goes down. d. You may drive when you are no longer taking prescription pain medication, you can comfortably  wear a seatbelt, and you can safely maneuver your car and apply brakes. e. Bonita QuinYou may have sexual intercourse when it is comfortable.  8. FOLLOW UP in our office a. Please call [facility] at [phone] to set up an appointment to see your surgeon in the office for a follow-up appointment approximately 2-3 weeks after your surgery. b. Make sure that you call for this appointment the day you arrive home to insure a convenient appointment time. 9. IF YOU HAVE DISABILITY OR FAMILY LEAVE FORMS, BRING THEM TO THE OFFICE FOR PROCESSING.  DO NOT GIVE THEM TO YOUR DOCTOR.   WHEN TO CALL US [phone]: 1. Poor pain control 2. Reactions / problems with new medications (rash/itching, nausea, etc)  3. Fever over 101.5 F (38.5 C) 4. Worsening swelling or bruising 5. Continued bleeding from incision. 6. Increased pain, redness, or drainage from the incision 7. Difficulty breathing / swallowing   The clinic staff is available to answer your questions during regular business hours (8:30am-5pm).  Please dont hesitate to call and ask to speak to one of our nurses for clinical concerns.   If you have a medical emergency, go to the nearest emergency room or call  911.  [facility] is always on call at the [facility]   [facility], [place] [address], [address], [place], [place]  [id number] ? MAIN: [phone] ? TOLL FREE: [phone] ?  FAX [phone] [email or web address]

## 2018-05-01 NOTE — Anesthesia Preprocedure Evaluation (Signed)
Anesthesia Evaluation  Patient identified by MRN, date of birth, ID band Patient awake    Reviewed: Allergy & Precautions, NPO status , Patient's Chart, lab work & pertinent test results  Airway Mallampati: II  TM Distance: >3 FB Neck ROM: Full    Dental no notable dental hx.    Pulmonary neg pulmonary ROS, former smoker,    Pulmonary exam normal breath sounds clear to auscultation       Cardiovascular negative cardio ROS Normal cardiovascular exam Rhythm:Regular Rate:Normal     Neuro/Psych  Headaches, Anxiety Depression Bipolar Disorder negative psych ROS   GI/Hepatic Neg liver ROS, GERD  ,  Endo/Other  negative endocrine ROS  Renal/GU negative Renal ROS  negative genitourinary   Musculoskeletal negative musculoskeletal ROS (+)   Abdominal (+) + obese,   Peds negative pediatric ROS (+)  Hematology negative hematology ROS (+)   Anesthesia Other Findings   Reproductive/Obstetrics negative OB ROS                             Anesthesia Physical Anesthesia Plan  ASA: II  Anesthesia Plan: MAC   Post-op Pain Management:    Induction: Intravenous  PONV Risk Score and Plan: 2 and Ondansetron and Midazolam  Airway Management Planned: Simple Face Mask  Additional Equipment:   Intra-op Plan:   Post-operative Plan:   Informed Consent: I have reviewed the patients History and Physical, chart, labs and discussed the procedure including the risks, benefits and alternatives for the proposed anesthesia with the patient or authorized representative who has indicated his/her understanding and acceptance.     Dental advisory given  Plan Discussed with: CRNA  Anesthesia Plan Comments:         Anesthesia Quick Evaluation

## 2018-05-02 ENCOUNTER — Encounter (HOSPITAL_BASED_OUTPATIENT_CLINIC_OR_DEPARTMENT_OTHER): Payer: Self-pay | Admitting: Surgery

## 2019-09-12 ENCOUNTER — Other Ambulatory Visit: Payer: Self-pay

## 2019-09-12 ENCOUNTER — Ambulatory Visit (HOSPITAL_COMMUNITY)
Admission: EM | Admit: 2019-09-12 | Discharge: 2019-09-12 | Disposition: A | Discharge status: Home / Self Care | Payer: Medicaid Other | Attending: Family Medicine | Admitting: Family Medicine

## 2019-09-12 ENCOUNTER — Encounter (HOSPITAL_COMMUNITY): Payer: Self-pay

## 2019-09-12 DIAGNOSIS — N3090 Cystitis, unspecified without hematuria: Secondary | ICD-10-CM | POA: Insufficient documentation

## 2019-09-12 DIAGNOSIS — R300 Dysuria: Secondary | ICD-10-CM | POA: Diagnosis not present

## 2019-09-12 DIAGNOSIS — Z3202 Encounter for pregnancy test, result negative: Secondary | ICD-10-CM | POA: Diagnosis not present

## 2019-09-12 LAB — POCT URINALYSIS DIP (DEVICE)
Bilirubin Urine: NEGATIVE
Glucose, UA: NEGATIVE mg/dL
Ketones, ur: NEGATIVE mg/dL
Nitrite: NEGATIVE
Protein, ur: 100 mg/dL — AB
Specific Gravity, Urine: 1.025 (ref 1.005–1.030)
Urobilinogen, UA: 1 mg/dL (ref 0.0–1.0)
pH: 6.5 (ref 5.0–8.0)

## 2019-09-12 LAB — POC URINE PREG, ED: Preg Test, Ur: NEGATIVE

## 2019-09-12 MED ORDER — CEPHALEXIN 500 MG PO CAPS: 500.0000 mg | ORAL_CAPSULE | Freq: Two times a day (BID) | ORAL | 0 refills | Status: DC

## 2019-09-12 NOTE — ED Triage Notes (Signed)
Patient reports she started noticing her urine was a dark brown/red color. Also reports sharp pain in her back and abdomen while urinating.
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
## 2019-09-14 LAB — URINE CULTURE: Culture: NO GROWTH

## 2020-01-03 ENCOUNTER — Ambulatory Visit (HOSPITAL_COMMUNITY)
Admission: EM | Admit: 2020-01-03 | Discharge: 2020-01-03 | Disposition: A | Discharge status: Home / Self Care | Payer: Medicaid Other | Attending: Emergency Medicine | Admitting: Emergency Medicine

## 2020-01-03 ENCOUNTER — Other Ambulatory Visit: Payer: Self-pay

## 2020-01-03 DIAGNOSIS — Z3202 Encounter for pregnancy test, result negative: Secondary | ICD-10-CM

## 2020-01-03 DIAGNOSIS — N898 Other specified noninflammatory disorders of vagina: Secondary | ICD-10-CM | POA: Diagnosis not present

## 2020-01-03 LAB — POCT URINALYSIS DIPSTICK, ED / UC
Glucose, UA: NEGATIVE mg/dL
Nitrite: NEGATIVE
Protein, ur: 30 mg/dL — AB
Specific Gravity, Urine: >=1.03 (ref 1.005–1.030)
Urobilinogen, UA: 1 mg/dL (ref 0.0–1.0)
pH: 6 (ref 5.0–8.0)

## 2020-01-03 LAB — POC URINE PREG, ED: Preg Test, Ur: NEGATIVE

## 2020-01-03 MED ORDER — FLUCONAZOLE 150 MG PO TABS: 150.0000 mg | ORAL_TABLET | Freq: Once | ORAL | 0 refills | Status: AC
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
## 2020-01-03 NOTE — ED Triage Notes (Signed)
PT reports vag DC that is white with tan color . This started on Tuesday

## 2020-01-03 NOTE — Discharge Instructions (Signed)
Take 1 tab of Diflucan today, may repeat in 72 hours to treat yeast infection  We are testing you for Gonorrhea, Chlamydia, Trichomonas, Yeast and Bacterial Vaginosis. We will call you if anything is positive and let you know if you require any further treatment. Please inform partners of any positive results.   Please return if symptoms not improving with treatment, development of fever, nausea, vomiting, abdominal pain.

## 2020-01-04 ENCOUNTER — Telehealth (HOSPITAL_COMMUNITY): Payer: Self-pay | Admitting: Emergency Medicine

## 2020-01-04 LAB — CERVICOVAGINAL ANCILLARY ONLY
Bacterial Vaginitis (gardnerella): NEGATIVE
Candida Glabrata: NEGATIVE
Candida Vaginitis: POSITIVE — AB
Chlamydia: NEGATIVE
Comment: NEGATIVE
Comment: NEGATIVE
Comment: NEGATIVE
Comment: NEGATIVE
Comment: NEGATIVE
Comment: NORMAL
Neisseria Gonorrhea: NEGATIVE
Trichomonas: NEGATIVE

## 2020-01-04 MED ORDER — FLUCONAZOLE 150 MG PO TABS: 150.0000 mg | ORAL_TABLET | Freq: Once | ORAL | 0 refills | Status: AC

## 2020-01-05 LAB — URINE CULTURE: Culture: >=100000 — AB

## 2020-01-06 ENCOUNTER — Emergency Department (HOSPITAL_COMMUNITY)
Admission: EM | Admit: 2020-01-06 | Discharge: 2020-01-06 | Disposition: A | Discharge status: Home / Self Care | Payer: Medicaid Other | Attending: Emergency Medicine | Admitting: Emergency Medicine

## 2020-01-06 ENCOUNTER — Encounter (HOSPITAL_COMMUNITY): Payer: Self-pay

## 2020-01-06 DIAGNOSIS — Z91040 Latex allergy status: Secondary | ICD-10-CM | POA: Insufficient documentation

## 2020-01-06 DIAGNOSIS — Z7982 Long term (current) use of aspirin: Secondary | ICD-10-CM | POA: Diagnosis not present

## 2020-01-06 DIAGNOSIS — N760 Acute vaginitis: Secondary | ICD-10-CM | POA: Diagnosis not present

## 2020-01-06 DIAGNOSIS — B9689 Other specified bacterial agents as the cause of diseases classified elsewhere: Secondary | ICD-10-CM | POA: Diagnosis not present

## 2020-01-06 DIAGNOSIS — N390 Urinary tract infection, site not specified: Secondary | ICD-10-CM | POA: Diagnosis not present

## 2020-01-06 DIAGNOSIS — Z87891 Personal history of nicotine dependence: Secondary | ICD-10-CM | POA: Insufficient documentation

## 2020-01-06 DIAGNOSIS — N898 Other specified noninflammatory disorders of vagina: Secondary | ICD-10-CM | POA: Diagnosis present

## 2020-01-06 LAB — URINALYSIS, ROUTINE W REFLEX MICROSCOPIC
Bilirubin Urine: NEGATIVE
Glucose, UA: NEGATIVE mg/dL
Hgb urine dipstick: NEGATIVE
Ketones, ur: NEGATIVE mg/dL
Nitrite: NEGATIVE
Protein, ur: 30 mg/dL — AB
Specific Gravity, Urine: 1.028 (ref 1.005–1.030)
pH: 6 (ref 5.0–8.0)

## 2020-01-06 LAB — I-STAT BETA HCG BLOOD, ED (MC, WL, AP ONLY): I-stat hCG, quantitative: <5 m[IU]/mL (ref <5)

## 2020-01-06 LAB — WET PREP, GENITAL
Sperm: NONE SEEN
Trich, Wet Prep: NONE SEEN
Yeast Wet Prep HPF POC: NONE SEEN

## 2020-01-06 MED ORDER — METRONIDAZOLE 500 MG PO TABS: 500.0000 mg | ORAL_TABLET | Freq: Two times a day (BID) | ORAL | 0 refills | Status: DC

## 2020-01-06 MED ORDER — CEPHALEXIN 500 MG PO CAPS: 500.0000 mg | ORAL_CAPSULE | Freq: Two times a day (BID) | ORAL | 0 refills | Status: AC

## 2020-01-06 MED ORDER — CEPHALEXIN 500 MG PO CAPS: 500.0000 mg | ORAL_CAPSULE | Freq: Two times a day (BID) | ORAL | 0 refills | Status: DC

## 2020-01-06 NOTE — Discharge Instructions (Addendum)
Take Flagyl as prescribed for BV. Take Keflex as prescribed for UTI. Follow up as listed below if symptoms persist. Take your last Diflucan after your complete the antibiotics if still having itching.  There are many options for quick evaluation and management of certain GYN issues that do not require a long wait time or big bill from the emergency department.   Consider these options for your care in the future:   Walk-ins for certain complaints available at:  [facility]  [address] [phone]  See hours at [email or web address]   Center for [facility]  [address]  [phone]   [facility] for [employer]  [phone]   You can make an appointment to see a GYN provider:   Center for [facility] at [facility]  [address] [phone]   Center for [facility] at [facility]  [address] [employer]  [phone]   Center for [facility] at [facility]  [phone]   Center for [facility] at [facility]  [address], [address]  [phone]   If you already have an established OB/GYN provider in the area you can make an appointment with them as well.

## 2020-01-06 NOTE — ED Notes (Signed)
Patient brought back to [facility] by PA to d/c; Pt has been assessed in triage by EDP and patient only needs to be discharge-Monique,RN
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
## 2020-01-06 NOTE — ED Triage Notes (Signed)
Pt reports that she was a UC on the 14th and given diflucan, reports white discharge and dysuria and pelvic pressure. Pt concerned for BV.

## 2020-01-20 ENCOUNTER — Encounter (HOSPITAL_COMMUNITY): Payer: Self-pay | Admitting: Emergency Medicine

## 2020-01-20 ENCOUNTER — Ambulatory Visit (INDEPENDENT_AMBULATORY_CARE_PROVIDER_SITE_OTHER): Payer: Medicaid Other

## 2020-01-20 ENCOUNTER — Other Ambulatory Visit: Payer: Self-pay

## 2020-01-20 ENCOUNTER — Ambulatory Visit (HOSPITAL_COMMUNITY)
Admission: EM | Admit: 2020-01-20 | Discharge: 2020-01-20 | Disposition: A | Discharge status: Home / Self Care | Payer: Medicaid Other | Attending: Family Medicine | Admitting: Family Medicine

## 2020-01-20 DIAGNOSIS — Z1152 Encounter for screening for COVID-19: Secondary | ICD-10-CM

## 2020-01-20 DIAGNOSIS — R52 Pain, unspecified: Secondary | ICD-10-CM

## 2020-01-20 DIAGNOSIS — R059 Cough, unspecified: Secondary | ICD-10-CM

## 2020-01-20 DIAGNOSIS — J209 Acute bronchitis, unspecified: Secondary | ICD-10-CM

## 2020-01-20 DIAGNOSIS — R0602 Shortness of breath: Secondary | ICD-10-CM | POA: Diagnosis not present

## 2020-01-20 DIAGNOSIS — R509 Fever, unspecified: Secondary | ICD-10-CM

## 2020-01-20 DIAGNOSIS — R6883 Chills (without fever): Secondary | ICD-10-CM

## 2020-01-20 DIAGNOSIS — R5383 Other fatigue: Secondary | ICD-10-CM | POA: Diagnosis present

## 2020-01-20 LAB — RESP PANEL BY RT PCR (RSV, FLU A&B, COVID)
Influenza A by PCR: NEGATIVE
Influenza B by PCR: NEGATIVE
Respiratory Syncytial Virus by PCR: NEGATIVE
SARS Coronavirus 2 by RT PCR: POSITIVE — AB

## 2020-01-20 MED ORDER — PREDNISONE 10 MG (21) PO TBPK: ORAL_TABLET | Freq: Every day | ORAL | 0 refills | Status: AC

## 2020-01-20 MED ORDER — ALBUTEROL SULFATE HFA 108 (90 BASE) MCG/ACT IN AERS: 2.0000 | INHALATION_SPRAY | RESPIRATORY_TRACT | 0 refills | Status: DC | PRN

## 2020-01-20 MED ORDER — DEXAMETHASONE SODIUM PHOSPHATE 10 MG/ML IJ SOLN
10.0000 mg | Freq: Once | INTRAMUSCULAR | Status: AC
  Administered 2020-01-20: 10 mg via INTRAMUSCULAR

## 2020-01-20 MED ORDER — CHERATUSSIN AC 100-10 MG/5ML PO SOLN: 5.0000 mL | Freq: Three times a day (TID) | ORAL | 0 refills | Status: DC | PRN

## 2020-01-20 MED ORDER — DEXAMETHASONE SODIUM PHOSPHATE 10 MG/ML IJ SOLN
INTRAMUSCULAR | Status: AC
  Filled 2020-01-20: qty 1

## 2020-01-20 NOTE — Discharge Instructions (Addendum)
I am going to treat you for bronchitis  I have sent in albuterol for you to use 2 puffs every 4-6 hours as needed for cough, shortness of breath, wheezing  I have sent in a prednisone taper for you to take for 6 days. 6 tablets on day one, 5 tablets on day two, 4 tablets on day three, 3 tablets on day four, 2 tablets on day five, and 1 tablet on day six.  You have received a steroid injection in the office today. Start the oral medications tomorrow.  Your COVID/FLU/RSV test is pending.  You should self quarantine until the test result is back.    Take Tylenol as needed for fever or discomfort.  Rest and keep yourself hydrated.    Go to the emergency department if you develop acute worsening symptoms.
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
## 2020-01-20 NOTE — ED Triage Notes (Signed)
Pt presents with dry cough and SOB xs 5 days. States started taking OTC cold medications on Monday for chest congestion.

## 2020-01-21 ENCOUNTER — Encounter: Payer: Self-pay | Admitting: Nurse Practitioner

## 2020-01-21 ENCOUNTER — Telehealth: Payer: Self-pay | Admitting: Nurse Practitioner

## 2020-01-21 ENCOUNTER — Telehealth: Payer: Self-pay | Admitting: Physician Assistant

## 2020-01-21 DIAGNOSIS — U071 COVID-19: Secondary | ICD-10-CM

## 2020-01-21 NOTE — Telephone Encounter (Signed)
Called to Discuss with patient about Covid symptoms and the use of monoclonal antibody infusions for those with mild to moderate Covid symptoms and at a high risk of hospitalization.     Pt is qualified for this infusion at the [facility] infusion center due to co-morbid conditions and/or a member of an at-risk group.     Unable to reach pt. Left message to return call. Sent [employer].   Consuello Masse, DNP, AGNP-C [phone] (Infusion Center Hotline)

## 2020-01-21 NOTE — Telephone Encounter (Signed)
Called to discuss with Heidi [name] about Covid symptoms and the use of  monoclonal antibody infusion for those with mild to moderate Covid symptoms and at a high risk of hospitalization.     Pt is qualified for this infusion due to co-morbid conditions and/or a member of an at-risk group, however declines infusion at this time.  Symptoms reviewed as well as criteria for ending isolation.  Symptoms reviewed that would warrant ED/Hospital evaluation. Preventative practices reviewed. Patient verbalized understanding. Patient advised to call back if he decides that he does want to get infusion. Callback number to the infusion center given. Patient advised to go to Urgent care or ED with severe symptoms.  She did not gave any informations and declined infusion.   Manson Passey, PA-C

## 2020-01-22 ENCOUNTER — Other Ambulatory Visit: Payer: Self-pay

## 2020-01-22 ENCOUNTER — Encounter (HOSPITAL_COMMUNITY): Payer: Self-pay | Admitting: Emergency Medicine

## 2020-01-22 ENCOUNTER — Ambulatory Visit (HOSPITAL_COMMUNITY)
Admission: EM | Admit: 2020-01-22 | Discharge: 2020-01-22 | Disposition: A | Discharge status: Home / Self Care | Payer: Medicaid Other | Attending: Emergency Medicine | Admitting: Emergency Medicine

## 2020-01-22 DIAGNOSIS — U071 COVID-19: Secondary | ICD-10-CM

## 2020-01-22 DIAGNOSIS — R059 Cough, unspecified: Secondary | ICD-10-CM | POA: Diagnosis not present

## 2020-01-22 DIAGNOSIS — K219 Gastro-esophageal reflux disease without esophagitis: Secondary | ICD-10-CM | POA: Diagnosis not present

## 2020-01-22 DIAGNOSIS — Z760 Encounter for issue of repeat prescription: Secondary | ICD-10-CM

## 2020-01-22 MED ORDER — AEROCHAMBER PLUS MISC: 2 refills | Status: DC

## 2020-01-22 MED ORDER — BENZONATATE 200 MG PO CAPS: 200.0000 mg | ORAL_CAPSULE | Freq: Three times a day (TID) | ORAL | 0 refills | Status: DC | PRN

## 2020-01-22 MED ORDER — HYDROCOD POLST-CPM POLST ER 10-8 MG/5ML PO SUER: 5.0000 mL | Freq: Two times a day (BID) | ORAL | 0 refills | Status: DC | PRN

## 2020-01-22 MED ORDER — PANTOPRAZOLE SODIUM 20 MG PO TBEC: 20.0000 mg | DELAYED_RELEASE_TABLET | Freq: Every day | ORAL | 0 refills | Status: DC

## 2020-01-22 NOTE — ED Triage Notes (Signed)
Pt reports cough is not better. Pt reports she was seen on 01-20-20 and continues to have a constant cough. The meds given for home use are not helping the cough. Pt reports COVID test done on 01-20-20 was neg.  Pt denies N/V/D.
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
## 2020-01-22 NOTE — Discharge Instructions (Addendum)
1 to 2 puffs from your albuterol inhaler using your spacer every 4-6 hours.  Tussionex for the cough at night, Tessalon for the cough during the day.  Restart the Protonix in case this is contributing to your cough.  I hope that you feel better soon.

## 2020-07-08 ENCOUNTER — Other Ambulatory Visit: Payer: Self-pay

## 2020-07-08 ENCOUNTER — Ambulatory Visit (HOSPITAL_COMMUNITY)
Admission: EM | Admit: 2020-07-08 | Discharge: 2020-07-08 | Disposition: A | Discharge status: Home / Self Care | Payer: Medicaid Other | Attending: Family Medicine | Admitting: Family Medicine

## 2020-07-08 ENCOUNTER — Encounter (HOSPITAL_COMMUNITY): Payer: Self-pay | Admitting: Emergency Medicine

## 2020-07-08 DIAGNOSIS — N898 Other specified noninflammatory disorders of vagina: Secondary | ICD-10-CM | POA: Diagnosis present

## 2020-07-08 MED ORDER — FLUCONAZOLE 150 MG PO TABS: ORAL_TABLET | ORAL | 0 refills | Status: DC

## 2020-07-08 MED ORDER — METRONIDAZOLE 500 MG PO TABS: 500.0000 mg | ORAL_TABLET | Freq: Two times a day (BID) | ORAL | 0 refills | Status: DC

## 2020-07-08 NOTE — Discharge Instructions (Signed)
We have sent testing for sexually transmitted infections. We will notify you of any positive results once they are received. If required, we will prescribe any medications you might need. °

## 2020-07-08 NOTE — ED Triage Notes (Signed)
Vaginal irritation for 2 days.  Patient reports discharge .  Denies back pain or abdominal pain

## 2020-07-09 LAB — CERVICOVAGINAL ANCILLARY ONLY
Bacterial Vaginitis (gardnerella): NEGATIVE
Candida Glabrata: NEGATIVE
Candida Vaginitis: POSITIVE — AB
Chlamydia: NEGATIVE
Comment: NEGATIVE
Comment: NEGATIVE
Comment: NEGATIVE
Comment: NEGATIVE
Comment: NEGATIVE
Comment: NORMAL
Neisseria Gonorrhea: NEGATIVE
Trichomonas: NEGATIVE
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
## 2020-10-31 ENCOUNTER — Other Ambulatory Visit (HOSPITAL_COMMUNITY)
Admission: RE | Admit: 2020-10-31 | Discharge: 2020-10-31 | Disposition: A | Discharge status: Home / Self Care | Payer: Medicaid Other | Source: Ambulatory Visit | Attending: Family Medicine | Admitting: Family Medicine

## 2020-10-31 ENCOUNTER — Ambulatory Visit (INDEPENDENT_AMBULATORY_CARE_PROVIDER_SITE_OTHER): Payer: Medicaid Other | Admitting: Family Medicine

## 2020-10-31 ENCOUNTER — Encounter: Payer: Self-pay | Admitting: Family Medicine

## 2020-10-31 ENCOUNTER — Other Ambulatory Visit: Payer: Self-pay

## 2020-10-31 VITALS — BP 120/60 | HR 71 | Ht 64.0 in | Wt 163.0 lb

## 2020-10-31 DIAGNOSIS — Z0000 Encounter for general adult medical examination without abnormal findings: Secondary | ICD-10-CM

## 2020-10-31 DIAGNOSIS — Z113 Encounter for screening for infections with a predominantly sexual mode of transmission: Secondary | ICD-10-CM | POA: Insufficient documentation

## 2020-10-31 DIAGNOSIS — Z124 Encounter for screening for malignant neoplasm of cervix: Secondary | ICD-10-CM

## 2020-10-31 DIAGNOSIS — R519 Headache, unspecified: Secondary | ICD-10-CM | POA: Diagnosis not present

## 2020-10-31 DIAGNOSIS — G8929 Other chronic pain: Secondary | ICD-10-CM

## 2020-10-31 MED ORDER — PROPRANOLOL HCL 20 MG PO TABS: 20.0000 mg | ORAL_TABLET | Freq: Two times a day (BID) | ORAL | 2 refills | Status: DC

## 2020-10-31 NOTE — Patient Instructions (Signed)
It was great to meet you!  - Today we did your pap smear (screening for cervical cancer). I will call you or send you a MyChart message with the results. We also did routine testing for STDs (gonorrhea, chlamydia, and trichomonas) - If you're concerned about the cysts in your breast, please reach out to the breast center.  - We will start a daily medication to try to prevent your headaches. Try not to use the Excedrin more than once per week at the most. If you have any adverse effects with this medication let us know.   Take care and seek immediate care sooner if you develop any concerns.  Dr. [facility]

## 2020-10-31 NOTE — Progress Notes (Signed)
  Subjective:   CC: Establish care  HPI:  Heidi [name] is a 40 y.o. female who presents today to establish care. She was previously seen in our office, but was lost to follow up in 2018.   Initial concerns: Headaches- 5 days/week, takes Excedrin which is helpful, but she needs to take it every day. Used to be on Imitrex, but states it didn't agree with her and made her jittery and anxious. Headaches are usually on the right side, behind her eye and goes to back of head, +nausea, no photophobia or phonophobia.  She does report staring at screen all day at work, probably not drinking enough water, and not getting enough sleep either.  Past medical history: Bipolar disorder (on her problem list, patient states she only has a history of depression and anxiety, no mania), bladder prolapse, anemia, GERD, HSV  Past surgical history: excision of subcutaneous breast cysts  Current medications: none (aside from Excedrin)  Family history:  HTN (Mom and Dad), DM (Mom), prostate cancer (Dad), Pancreatic cancer (Paternal aunt)  Social history:  Lives with her 2 kids Works at [employer] at [employer] alcohol very rarely, no tobacco use (quit 9 years ago, previously smoked 1PPD x14 years), no recreational drugs Sexually active with 1 female partner  Objective:  BP 120/60   Pulse 71   Ht 5\' 4"  (1.626 m)   Wt 163 lb (73.9 kg)   SpO2 99%   BMI 27.98 kg/m   General: NAD, pleasant, able to participate in exam Cardiac: RRR, S1 S2 present. normal heart sounds, no murmurs. Respiratory: CTAB, normal effort, No wheezes, rales or rhonchi Abdomen: Bowel sounds present, non-tender, non-distended, no hepatosplenomegaly Extremities: no edema or cyanosis. Skin: warm and dry, no rashes noted Neuro: alert, no obvious focal deficits Psych: Normal affect and mood GU/GYN: Exam performed in the presence of a chaperone. External genitalia within normal limits.  Vaginal mucosa pink, moist, normal rugae.   Nonfriable cervix without lesions, no discharge or bleeding noted on speculum exam.  Bimanual exam revealed normal, nongravid uterus.  No cervical motion tenderness. No adnexal masses bilaterally.      Assessment & Plan:   Health Maintenance Pap obtained today along with routine STD testing per patient request Patient declines COVID vaccine. Will continue to address at future visits. Would recommend one time Hep C screening in the future if obtaining blood work Otherwise up to date on health maintenance items  Chronic headache Somewhat migrainous in nature (unilateral, associated nausea). Also likely a large component of tension-type headaches. She is currently taking Excedrin daily. Patient did not tolerate Imitrex in the past but did well with Propranolol many years ago. I do not have concern for significant intracranial pathology at this time. -Will re-trial propranolol 20mg  BID, can up-titrate dose as needed -Advised to decrease Excedrin use -Encouraged other headache prevention measures (adequate sleep, proper hydration, taking frequent breaks from screen time, minimizing stress when possible)   , MD [facility] PGY-2

## 2020-11-01 NOTE — Assessment & Plan Note (Addendum)
Somewhat migrainous in nature (unilateral, associated nausea). Also likely a large component of tension-type headaches. She is currently taking Excedrin daily. Patient did not tolerate Imitrex in the past but did well with Propranolol many years ago. I do not have concern for significant intracranial pathology at this time. -Will re-trial propranolol 20mg  BID, can up-titrate dose as needed -Advised to decrease Excedrin use -Encouraged other headache prevention measures (adequate sleep, proper hydration, taking frequent breaks from screen time, minimizing stress when possible)

## 2020-11-03 LAB — CYTOLOGY - PAP
Chlamydia: NEGATIVE
Comment: NEGATIVE
Comment: NEGATIVE
Comment: NEGATIVE
Comment: NORMAL
Diagnosis: NEGATIVE
High risk HPV: NEGATIVE
Neisseria Gonorrhea: NEGATIVE
Trichomonas: NEGATIVE

## 2020-11-15 ENCOUNTER — Ambulatory Visit (HOSPITAL_COMMUNITY)
Admission: EM | Admit: 2020-11-15 | Discharge: 2020-11-15 | Disposition: A | Discharge status: Home / Self Care | Payer: Medicaid Other | Attending: Internal Medicine | Admitting: Internal Medicine

## 2020-11-15 ENCOUNTER — Encounter (HOSPITAL_COMMUNITY): Payer: Self-pay | Admitting: *Deleted

## 2020-11-15 ENCOUNTER — Other Ambulatory Visit: Payer: Self-pay

## 2020-11-15 DIAGNOSIS — Z112 Encounter for screening for other bacterial diseases: Secondary | ICD-10-CM | POA: Diagnosis not present

## 2020-11-15 DIAGNOSIS — Z1152 Encounter for screening for COVID-19: Secondary | ICD-10-CM | POA: Insufficient documentation

## 2020-11-15 DIAGNOSIS — J069 Acute upper respiratory infection, unspecified: Secondary | ICD-10-CM | POA: Insufficient documentation

## 2020-11-15 LAB — POCT RAPID STREP A, ED / UC: Streptococcus, Group A Screen (Direct): NEGATIVE

## 2020-11-15 MED ORDER — PROMETHAZINE-DM 6.25-15 MG/5ML PO SYRP: 5.0000 mL | ORAL_SOLUTION | Freq: Four times a day (QID) | ORAL | 0 refills | Status: DC | PRN

## 2020-11-15 MED ORDER — BENZONATATE 100 MG PO CAPS: 100.0000 mg | ORAL_CAPSULE | Freq: Three times a day (TID) | ORAL | 0 refills | Status: DC
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
## 2020-11-15 NOTE — Discharge Instructions (Addendum)
-  Promethazine DM cough syrup for congestion/cough. This could make you drowsy, so take at night before bed. °-Tessalon (Benzonatate) as needed for cough. Take one pill up to 3x daily (every 8 hours) °-You can take Tylenol up to 1000 mg 3 times daily, and ibuprofen up to 600 mg 3 times daily with food.  You can take these together, or alternate every 3-4 hours. °-With a virus, you're typically contagious for 5-7 days, or as long as you're having fevers.  ° °

## 2020-11-15 NOTE — ED Triage Notes (Signed)
Pt reports Cough ,sore throat and chills. 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

## 2020-11-16 LAB — SARS CORONAVIRUS 2 (TAT 6-24 HRS): SARS Coronavirus 2: POSITIVE — AB

## 2020-11-18 LAB — CULTURE, GROUP A STREP (THRC)

## 2021-02-04 ENCOUNTER — Ambulatory Visit (INDEPENDENT_AMBULATORY_CARE_PROVIDER_SITE_OTHER): Payer: Medicaid Other | Admitting: Family Medicine

## 2021-02-04 ENCOUNTER — Other Ambulatory Visit: Payer: Self-pay

## 2021-02-04 VITALS — BP 134/92 | HR 80 | Wt 166.0 lb

## 2021-02-04 DIAGNOSIS — R002 Palpitations: Secondary | ICD-10-CM

## 2021-02-04 DIAGNOSIS — D649 Anemia, unspecified: Secondary | ICD-10-CM | POA: Diagnosis not present

## 2021-02-04 DIAGNOSIS — R2689 Other abnormalities of gait and mobility: Secondary | ICD-10-CM

## 2021-02-04 NOTE — Patient Instructions (Signed)
Labs today to check on your blood levels, thyroid, liver and kidney function.  I want you to keep a journal of when these episodes are happening, how long they last, symptoms that are associated with it, anything that was happening before or after that could have contributed to these.  If they continue to worsen over the next couple of days I want you to either call us back on Friday or on Monday and we will determine if we need to go ahead and just get an MRI of your head and get neurology involved.

## 2021-02-04 NOTE — Progress Notes (Signed)
    SUBJECTIVE:   CHIEF COMPLAINT / HPI:   Feeling off balance, not dizzy. Works at [employer] and is on her feet a lot patient reports over the last few weeks she has been feeling off balance every day, and has progressively gotten worse.  She reports that Saturday and Sunday she began having an irritation in her eye that felt like she was "trying to focus but could not", she tried to use her glasses and there was no improvement.  On Sunday she was walking to the kitchen and felt off balance and caused her to fall to her knees and was having a fast heart rate.  She says this episode where she felt triggered a lot of her anxiety.  She notes that she has nausea with these episodes as well and "feels like something is in my ear, even though there is not anything".  She does not have any prodromal symptoms that she is aware of.  She has not been taking any medications for these symptoms.   She does have a lot of social life things going on with issues with her relationships that made her very upset during the visit.  She does feel like his anxiety has gotten worse in the last couple of weeks, whether or not this was before or after these episodes starting-she is not sure.  PERTINENT  PMH / PSH: Headaches  OBJECTIVE:   BP (!) 134/92   Pulse 80   Wt 166 lb (75.3 kg)   LMP 01/21/2021 (Approximate)   BMI 28.49 kg/m   Gen: well-appearing, NAD CV: RRR, no m/r/g appreciated, no peripheral edema Pulm: CTAB, no wheezes/crackles GI: soft, non-tender, non-distended Neuro: CN II: PERRL CN III, IV,VI: EOMI CV V: Normal sensation in V1, V2, V3 CVII: Symmetric smile and brow raise CN VIII: Normal hearing CN IX,X: Symmetric palate raise  CN XI: 5/5 shoulder shrug CN XII: Symmetric tongue protrusion  UE and LE strength 5/5 Normal sensation in UE and LE bilaterally    ASSESSMENT/PLAN:   Balance problem Intermittent sensation of being off balance, reassuring normal neurologic exam.  Consideration  for neurologic source, BPPV (unlikely, unable to elicit vertigo sensation during maneuvers in the room), cardiac etiology (but seems unlikely though with the history of the imbalance), thyroid disorder, electrolyte imbalance, anemia, hypoglycemia or hypotension.  Do feel the patient's anxiety is probably worsening these episodes, could be responsible for the majority of the symptoms but do not want to rule out other serious causes.  Discussed with patient to journal of the events when they happen and the context surrounding them as well as symptoms; ideally patient would also be able to check her blood pressure when these episodes happen.  Discussed options with the patient and decided that we will continue to watch for a few days after her blood work and she will journal of the episodes. If they are getting worse or more frequent either by this Friday or by Monday she will call our office and we will proceed with contacting neurology and getting an MRI of the brain without contrast (was discussed with Dr. Pollie Meyer). - CBC, CMP, TSH today   Evelena Leyden, DO [place] [facility]

## 2021-02-05 LAB — CBC
Hematocrit: 35.7 % (ref 34.0–46.6)
Hemoglobin: 11.2 g/dL (ref 11.1–15.9)
MCH: 20.9 pg — ABNORMAL LOW (ref 26.6–33.0)
MCHC: 31.4 g/dL — ABNORMAL LOW (ref 31.5–35.7)
MCV: 67 fL — ABNORMAL LOW (ref 79–97)
Platelets: 273 10*3/uL (ref 150–450)
RBC: 5.35 x10E6/uL — ABNORMAL HIGH (ref 3.77–5.28)
RDW: 16.1 % — ABNORMAL HIGH (ref 11.7–15.4)
WBC: 7.4 10*3/uL (ref 3.4–10.8)

## 2021-02-05 LAB — TSH: TSH: 0.863 u[IU]/mL (ref 0.450–4.500)

## 2021-02-05 LAB — BASIC METABOLIC PANEL
BUN/Creatinine Ratio: 16 (ref 9–23)
BUN: 16 mg/dL (ref 6–24)
CO2: 21 mmol/L (ref 20–29)
Calcium: 9.4 mg/dL (ref 8.7–10.2)
Chloride: 105 mmol/L (ref 96–106)
Creatinine, Ser: 1.01 mg/dL — ABNORMAL HIGH (ref 0.57–1.00)
Glucose: 94 mg/dL (ref 70–99)
Potassium: 4.2 mmol/L (ref 3.5–5.2)
Sodium: 142 mmol/L (ref 134–144)
eGFR: 72 mL/min/{1.73_m2} (ref >59)

## 2021-03-21 ENCOUNTER — Other Ambulatory Visit: Payer: Self-pay

## 2021-03-21 ENCOUNTER — Ambulatory Visit
Admission: EM | Admit: 2021-03-21 | Discharge: 2021-03-21 | Disposition: A | Discharge status: Home / Self Care | Payer: Medicaid Other | Attending: Internal Medicine | Admitting: Internal Medicine

## 2021-03-21 ENCOUNTER — Encounter: Payer: Self-pay | Admitting: Emergency Medicine

## 2021-03-21 DIAGNOSIS — J111 Influenza due to unidentified influenza virus with other respiratory manifestations: Secondary | ICD-10-CM | POA: Diagnosis not present

## 2021-03-21 DIAGNOSIS — J069 Acute upper respiratory infection, unspecified: Secondary | ICD-10-CM

## 2021-03-21 MED ORDER — BENZONATATE 100 MG PO CAPS: 100.0000 mg | ORAL_CAPSULE | Freq: Three times a day (TID) | ORAL | 0 refills | Status: DC | PRN

## 2021-03-21 MED ORDER — FLUTICASONE PROPIONATE 50 MCG/ACT NA SUSP: 1.0000 | Freq: Every day | NASAL | 0 refills | Status: AC

## 2021-03-21 MED ORDER — CETIRIZINE HCL 10 MG PO TABS: 10.0000 mg | ORAL_TABLET | Freq: Every day | ORAL | 0 refills | Status: AC

## 2021-03-21 NOTE — Discharge Instructions (Signed)
You have a viral upper respiratory infection which is most likely the flu given your close exposure.  You have been prescribed 3 medications to help alleviate your symptoms.
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
## 2021-03-21 NOTE — ED Triage Notes (Signed)
Patient c/o cough, chest congestion x 4 days.  Patient's daughter tested positive for FLU A yesterday.  Patient has been taken Thera-Flu, Robitussin and Excedrin.

## 2021-03-24 ENCOUNTER — Other Ambulatory Visit: Payer: Self-pay | Admitting: Family Medicine

## 2021-03-24 ENCOUNTER — Encounter: Payer: Self-pay | Admitting: Family Medicine

## 2021-03-24 DIAGNOSIS — J209 Acute bronchitis, unspecified: Secondary | ICD-10-CM

## 2021-03-24 DIAGNOSIS — R059 Cough, unspecified: Secondary | ICD-10-CM

## 2021-03-24 DIAGNOSIS — R0602 Shortness of breath: Secondary | ICD-10-CM

## 2021-03-24 MED ORDER — ALBUTEROL SULFATE HFA 108 (90 BASE) MCG/ACT IN AERS: 2.0000 | INHALATION_SPRAY | Freq: Four times a day (QID) | RESPIRATORY_TRACT | 0 refills | Status: AC | PRN

## 2021-05-23 IMAGING — DX DG CHEST 2V
2 series · 2 of 2 positions shown · non-contrast
Comparison: November 12, 2014

CLINICAL DATA: Cough, shortness of breath, and fatigue.

EXAM:
CHEST - 2 VIEW

[chest pa]
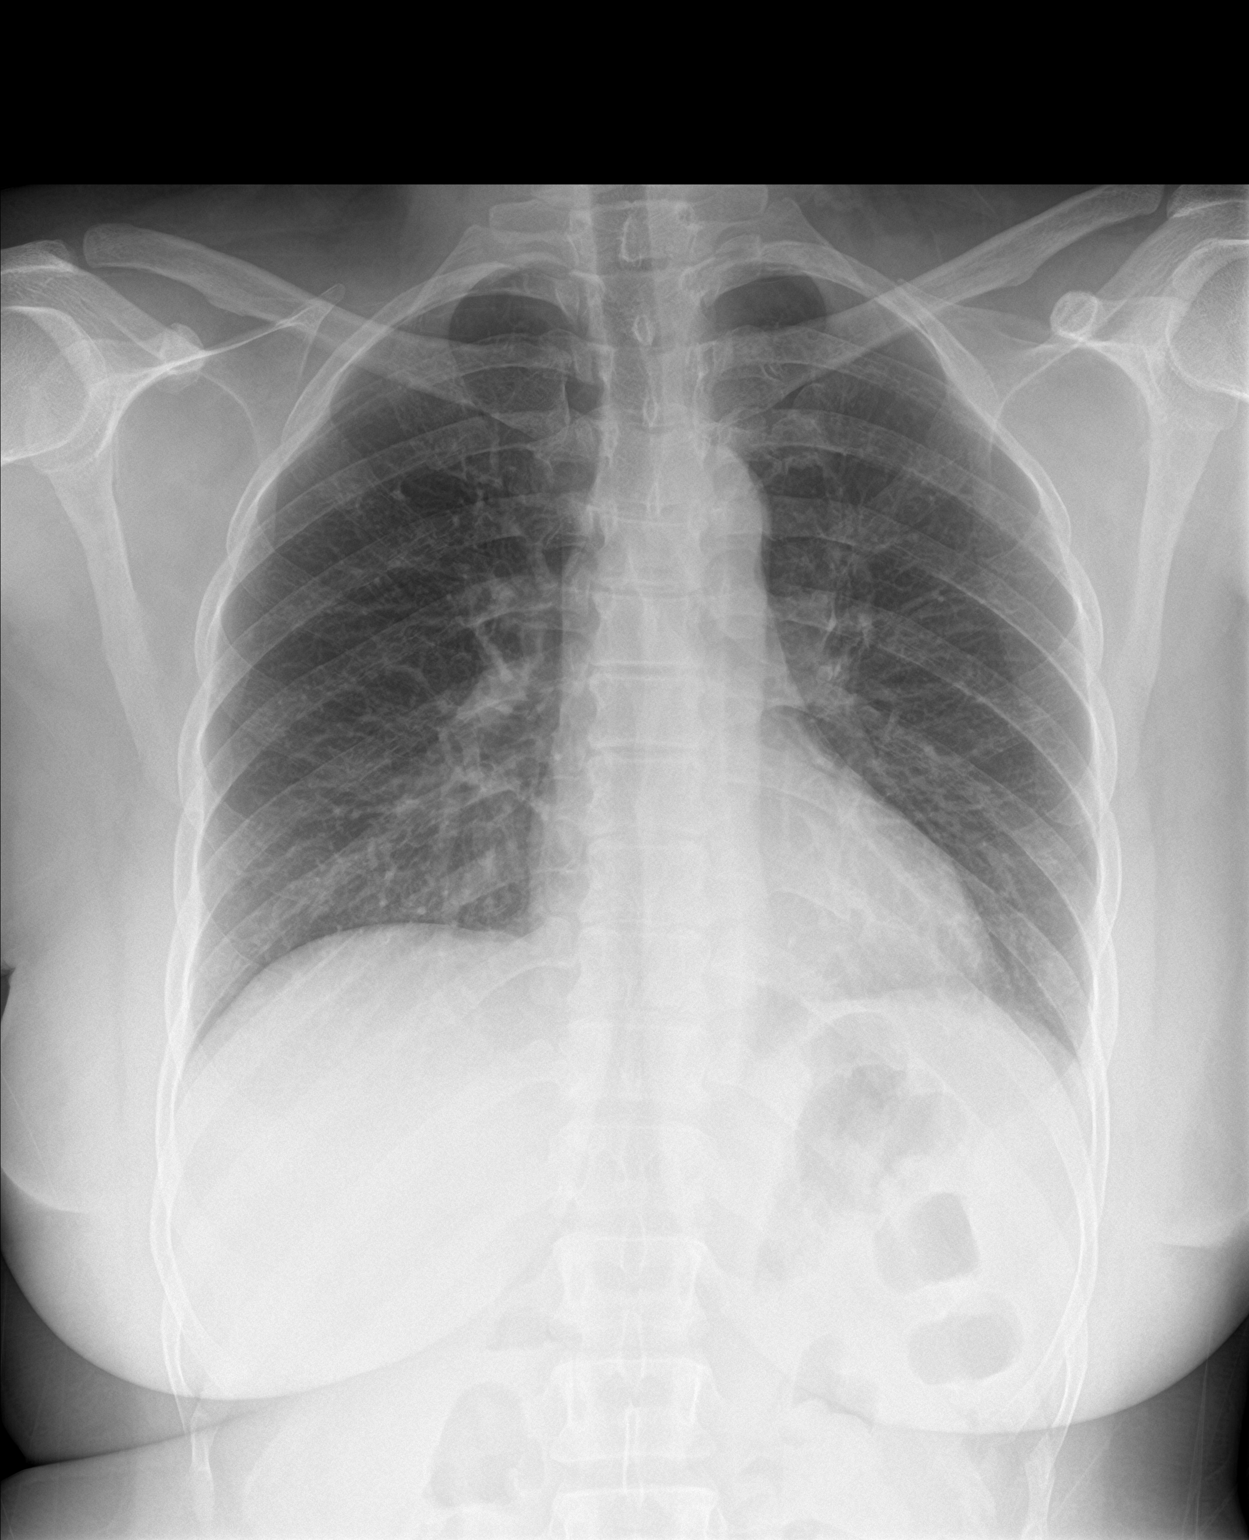

[chest lat]
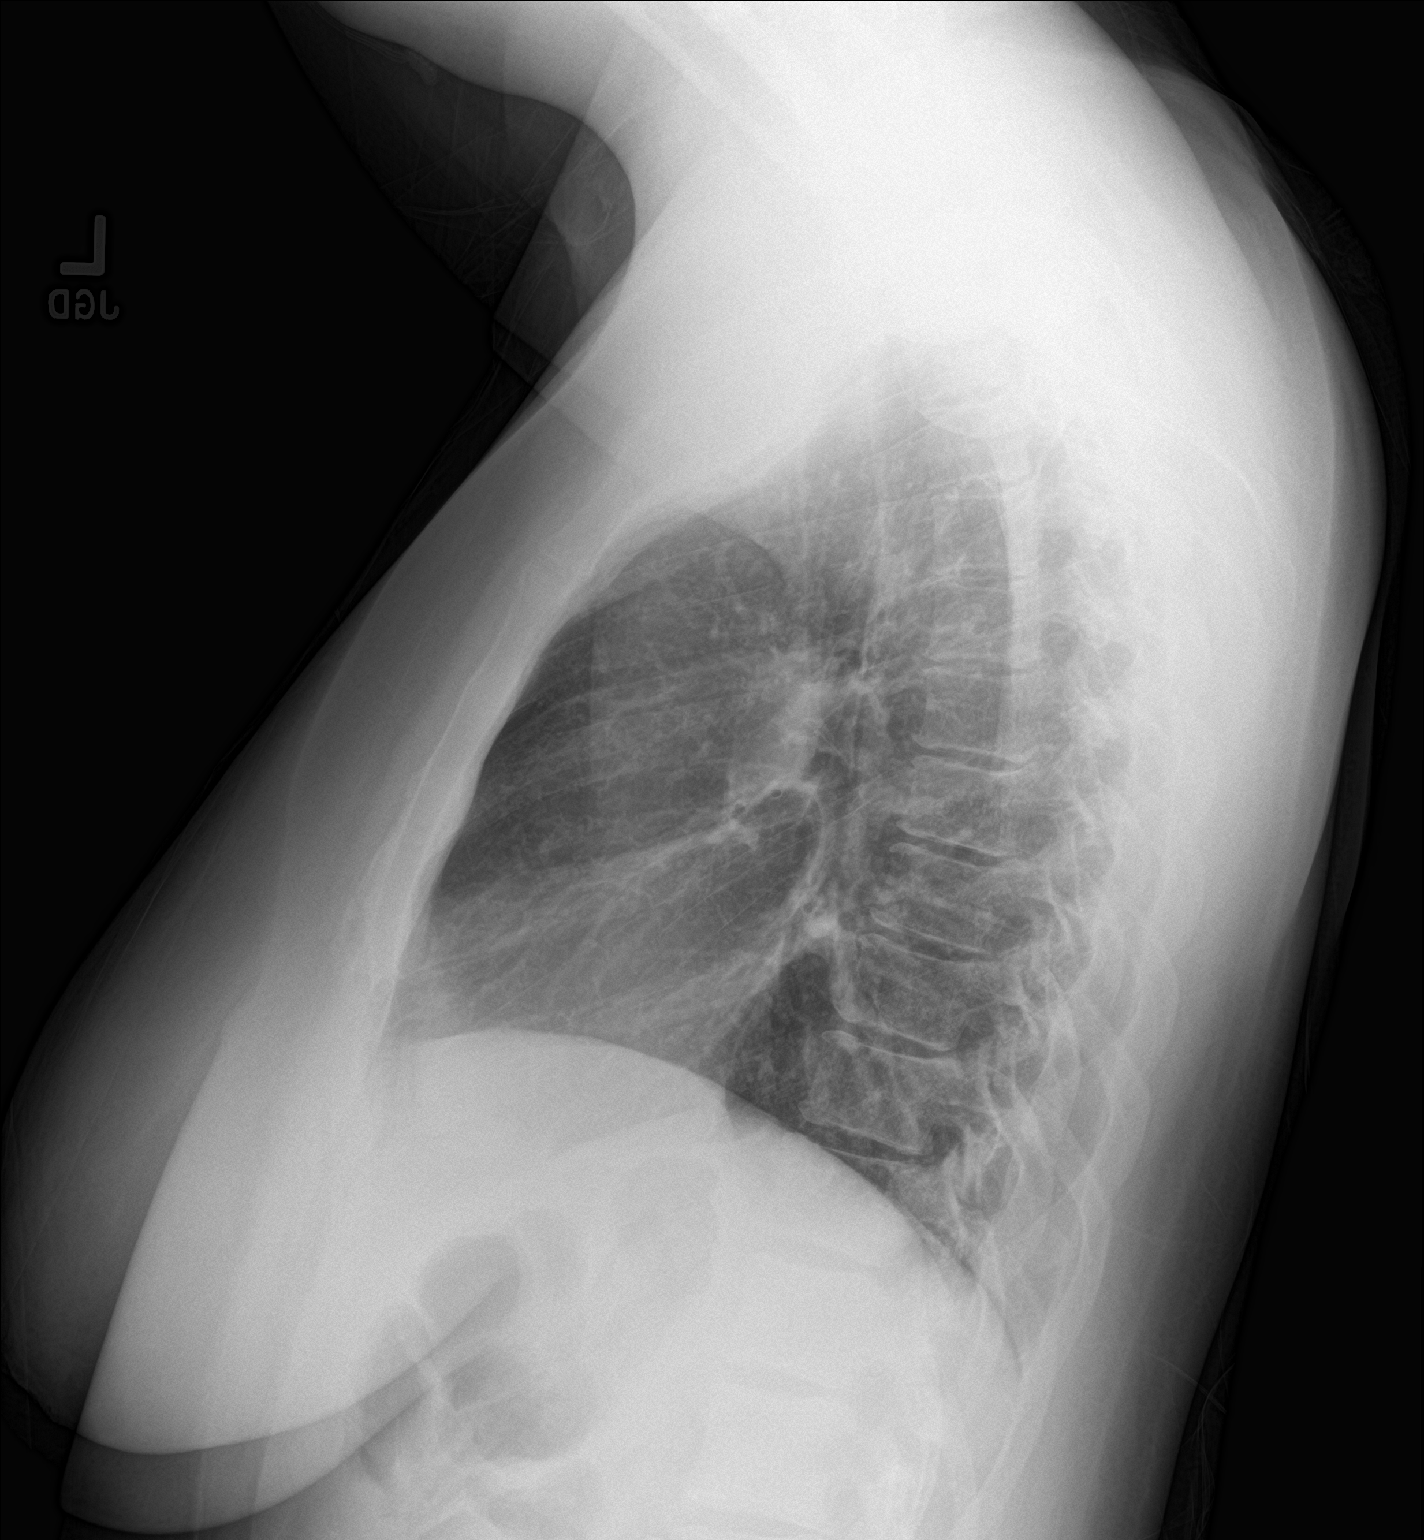

[2 of 2 positions shown; findings below may reference images not displayed]

FINDINGS: The heart size and mediastinal contours are within normal limits.
Both lungs are clear. The visualized skeletal structures are
unremarkable.
IMPRESSION: No active cardiopulmonary disease.

## 2021-08-16 ENCOUNTER — Emergency Department (HOSPITAL_COMMUNITY)
Admission: EM | Admit: 2021-08-16 | Discharge: 2021-08-16 | Disposition: A | Discharge status: Home / Self Care | Payer: Medicaid Other | Attending: Emergency Medicine | Admitting: Emergency Medicine

## 2021-08-16 ENCOUNTER — Other Ambulatory Visit: Payer: Self-pay

## 2021-08-16 ENCOUNTER — Encounter (HOSPITAL_COMMUNITY): Payer: Self-pay | Admitting: Emergency Medicine

## 2021-08-16 ENCOUNTER — Emergency Department (HOSPITAL_COMMUNITY): Payer: Medicaid Other

## 2021-08-16 DIAGNOSIS — R0789 Other chest pain: Secondary | ICD-10-CM | POA: Diagnosis not present

## 2021-08-16 DIAGNOSIS — R079 Chest pain, unspecified: Secondary | ICD-10-CM | POA: Diagnosis not present

## 2021-08-16 DIAGNOSIS — N9489 Other specified conditions associated with female genital organs and menstrual cycle: Secondary | ICD-10-CM | POA: Diagnosis not present

## 2021-08-16 DIAGNOSIS — R9431 Abnormal electrocardiogram [ECG] [EKG]: Secondary | ICD-10-CM | POA: Diagnosis not present

## 2021-08-16 DIAGNOSIS — Z91040 Latex allergy status: Secondary | ICD-10-CM | POA: Diagnosis not present

## 2021-08-16 DIAGNOSIS — M549 Dorsalgia, unspecified: Secondary | ICD-10-CM | POA: Diagnosis not present

## 2021-08-16 DIAGNOSIS — R0602 Shortness of breath: Secondary | ICD-10-CM | POA: Diagnosis not present

## 2021-08-16 DIAGNOSIS — R072 Precordial pain: Secondary | ICD-10-CM | POA: Diagnosis not present

## 2021-08-16 DIAGNOSIS — I213 ST elevation (STEMI) myocardial infarction of unspecified site: Secondary | ICD-10-CM | POA: Diagnosis not present

## 2021-08-16 LAB — I-STAT BETA HCG BLOOD, ED (MC, WL, AP ONLY): I-stat hCG, quantitative: <5 m[IU]/mL (ref <5)

## 2021-08-16 LAB — CBC
HCT: 39.2 % (ref 36.0–46.0)
Hemoglobin: 11.7 g/dL — ABNORMAL LOW (ref 12.0–15.0)
MCH: 20.1 pg — ABNORMAL LOW (ref 26.0–34.0)
MCHC: 29.8 g/dL — ABNORMAL LOW (ref 30.0–36.0)
MCV: 67.2 fL — ABNORMAL LOW (ref 80.0–100.0)
Platelets: 210 10*3/uL (ref 150–400)
RBC: 5.83 MIL/uL — ABNORMAL HIGH (ref 3.87–5.11)
RDW: 16 % — ABNORMAL HIGH (ref 11.5–15.5)
WBC: 6.8 10*3/uL (ref 4.0–10.5)
nRBC: 0 % (ref 0.0–0.2)

## 2021-08-16 LAB — TROPONIN I (HIGH SENSITIVITY)
Troponin I (High Sensitivity): <2 ng/L (ref <18)
Troponin I (High Sensitivity): <2 ng/L (ref <18)

## 2021-08-16 LAB — BASIC METABOLIC PANEL
Anion gap: 6 (ref 5–15)
BUN: 19 mg/dL (ref 6–20)
CO2: 22 mmol/L (ref 22–32)
Calcium: 9.1 mg/dL (ref 8.9–10.3)
Chloride: 112 mmol/L — ABNORMAL HIGH (ref 98–111)
Creatinine, Ser: 1.1 mg/dL — ABNORMAL HIGH (ref 0.44–1.00)
GFR, Estimated: >60 mL/min (ref >60)
Glucose, Bld: 122 mg/dL — ABNORMAL HIGH (ref 70–99)
Potassium: 4.2 mmol/L (ref 3.5–5.1)
Sodium: 140 mmol/L (ref 135–145)

## 2021-08-16 MED ORDER — ACETAMINOPHEN 500 MG PO TABS
1000.0000 mg | ORAL_TABLET | Freq: Once | ORAL | Status: AC
  Administered 2021-08-16: 1000 mg via ORAL
  Filled 2021-08-16: qty 2
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
## 2021-08-16 NOTE — Discharge Instructions (Signed)
Like we discussed, please continue to monitor your symptoms closely and return to the emergency department with any new or worsening symptoms.  Please follow-up with your primary care provider regarding your symptoms as well as this visit today. 
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
## 2021-08-25 ENCOUNTER — Encounter: Payer: Self-pay | Admitting: *Deleted

## 2021-12-30 ENCOUNTER — Ambulatory Visit (INDEPENDENT_AMBULATORY_CARE_PROVIDER_SITE_OTHER): Payer: Medicaid Other | Admitting: Family Medicine

## 2021-12-30 ENCOUNTER — Encounter: Payer: Self-pay | Admitting: Family Medicine

## 2021-12-30 ENCOUNTER — Telehealth: Payer: Self-pay

## 2021-12-30 VITALS — BP 129/80 | HR 70 | Ht 63.0 in | Wt 156.6 lb

## 2021-12-30 DIAGNOSIS — N644 Mastodynia: Secondary | ICD-10-CM | POA: Diagnosis not present

## 2021-12-30 DIAGNOSIS — N924 Excessive bleeding in the premenopausal period: Secondary | ICD-10-CM | POA: Diagnosis not present

## 2021-12-30 NOTE — Telephone Encounter (Signed)
Attempted to reach patient. To inform of appt at [facility] for U/[place]. 12/30/21 at 10:15 arrival 10:30 appt. Patient is to check in at entrance C. Unable to LVM due to mailbox being full. Salvatore Marvel, CMA

## 2021-12-30 NOTE — Telephone Encounter (Signed)
Spoke with patient. Was able to inform her of her appt tomorrow at [facility] at 10:15. Salvatore Marvel, CMA

## 2021-12-30 NOTE — Patient Instructions (Signed)
For your breast tenderness, medical ahead and order for a diagnostic mammogram.  You can call the breast center to also help expedite getting this scheduled.  For your menstrual cycles, since they have changed this is kind of odd time where it can be normal or could be signs that we need to get biopsies.  We will start out with getting a vaginal ultrasound, pending the results of the ultrasound will determine if we need to get any type of biopsy.

## 2021-12-30 NOTE — Progress Notes (Signed)
    SUBJECTIVE:   CHIEF COMPLAINT / HPI:  Breast tenderness - Bilaterally  - present for a few months - Has hx of sebaceous cysts with surgical removal - Has several cysts now - Left nipple drains clear fluid - No known family hx of breast cancer  Heavy and painful menstrual cycle - 2 period last month - 1st period was lighter and 2nd one was  - Previously periods last 4 days and were every 30 days - Now gets cramps, did not happen before - History of fibroid tumors in mother - Has some pelvic pressure and history of small prolapse  PERTINENT  PMH / PSH: reviewed  OBJECTIVE:   BP 129/80   Pulse 70   Ht 5\' 3"  (1.6 m)   Wt 156 lb 9.6 oz (71 kg)   LMP 12/14/2021   SpO2 100%   BMI 27.74 kg/m   General: NAD, well-appearing, well-nourished Respiratory: No respiratory distress, breathing comfortably, able to speak in full sentences Skin: warm and dry, no rashes noted on exposed skin Psych: Appropriate affect and mood Breast: scars present on anterior sternal region that are well-healed. Small cysts palpated in bilateral breasts along medial and outer quadrant regions with tenderness to palpation in those regions as well as the nipple. No nipple discharge expressed on exam.  ASSESSMENT/PLAN:   Breast pain Present bilaterally with history of cysts. Will need a mammogram. Does have some discharge reported from left nipple.  - Diagnostic mammogram ordered  Abnormal Menstrual Cycle Abnormal cycle this last month, no other red flags at this time but need to have the patient evaluated. Family history of fibroids so will get an [place] and then determine if we need to get EMB. - Transvaginal US - Consider endometrial biopsy  - Continue to monitor   Rise Patience, [place]

## 2021-12-31 ENCOUNTER — Ambulatory Visit (HOSPITAL_COMMUNITY)
Admission: RE | Admit: 2021-12-31 | Discharge: 2021-12-31 | Disposition: A | Discharge status: Home / Self Care | Payer: Medicaid Other | Source: Ambulatory Visit | Attending: Family Medicine | Admitting: Family Medicine

## 2021-12-31 DIAGNOSIS — N924 Excessive bleeding in the premenopausal period: Secondary | ICD-10-CM | POA: Diagnosis not present

## 2021-12-31 DIAGNOSIS — N854 Malposition of uterus: Secondary | ICD-10-CM | POA: Diagnosis not present

## 2022-01-08 ENCOUNTER — Other Ambulatory Visit: Payer: Self-pay | Admitting: Family Medicine

## 2022-01-08 DIAGNOSIS — N644 Mastodynia: Secondary | ICD-10-CM

## 2022-01-18 ENCOUNTER — Ambulatory Visit
Admission: RE | Admit: 2022-01-18 | Discharge: 2022-01-18 | Disposition: A | Discharge status: Home / Self Care | Payer: Medicaid Other | Source: Ambulatory Visit | Attending: Family Medicine | Admitting: Family Medicine

## 2022-01-18 DIAGNOSIS — N6322 Unspecified lump in the left breast, upper inner quadrant: Secondary | ICD-10-CM | POA: Diagnosis not present

## 2022-01-18 DIAGNOSIS — R92323 Mammographic fibroglandular density, bilateral breasts: Secondary | ICD-10-CM | POA: Diagnosis not present

## 2022-01-18 DIAGNOSIS — N644 Mastodynia: Secondary | ICD-10-CM

## 2022-02-19 ENCOUNTER — Ambulatory Visit (HOSPITAL_COMMUNITY)
Admission: EM | Admit: 2022-02-19 | Discharge: 2022-02-19 | Disposition: A | Discharge status: Home / Self Care | Payer: Medicaid Other | Attending: Emergency Medicine | Admitting: Emergency Medicine

## 2022-02-19 DIAGNOSIS — W57XXXA Bitten or stung by nonvenomous insect and other nonvenomous arthropods, initial encounter: Secondary | ICD-10-CM

## 2022-02-19 DIAGNOSIS — R21 Rash and other nonspecific skin eruption: Secondary | ICD-10-CM

## 2022-02-19 MED ORDER — HYDROCORTISONE 2.5 % EX LOTN: TOPICAL_LOTION | Freq: Two times a day (BID) | CUTANEOUS | 0 refills | Status: DC

## 2022-02-19 NOTE — Discharge Instructions (Addendum)
Apply the steroid cream to the bites twice daily This will help with itching  Please return with any concerns or if symptoms worsen

## 2022-02-19 NOTE — ED Triage Notes (Signed)
Pt reports several bumps on her legs x 4 days.
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
## 2022-03-19 ENCOUNTER — Other Ambulatory Visit (HOSPITAL_COMMUNITY): Payer: Self-pay

## 2022-03-19 ENCOUNTER — Encounter (HOSPITAL_COMMUNITY): Payer: Self-pay

## 2022-03-19 ENCOUNTER — Ambulatory Visit (HOSPITAL_COMMUNITY)
Admission: EM | Admit: 2022-03-19 | Discharge: 2022-03-19 | Disposition: A | Discharge status: Home / Self Care | Payer: Medicaid Other | Attending: Emergency Medicine | Admitting: Emergency Medicine

## 2022-03-19 DIAGNOSIS — R051 Acute cough: Secondary | ICD-10-CM | POA: Diagnosis not present

## 2022-03-19 DIAGNOSIS — J029 Acute pharyngitis, unspecified: Secondary | ICD-10-CM | POA: Diagnosis not present

## 2022-03-19 LAB — POCT RAPID STREP A, ED / UC: Streptococcus, Group A Screen (Direct): NEGATIVE

## 2022-03-19 MED ORDER — FLUCONAZOLE 150 MG PO TABS
150.0000 mg | ORAL_TABLET | ORAL | 0 refills | Status: DC
  Filled 2022-03-19: qty 2, 2d supply, fill #0

## 2022-03-19 MED ORDER — BENZONATATE 100 MG PO CAPS: 100.0000 mg | ORAL_CAPSULE | Freq: Three times a day (TID) | ORAL | 0 refills | Status: DC

## 2022-03-19 MED ORDER — AMOXICILLIN 500 MG PO CAPS
500.0000 mg | ORAL_CAPSULE | Freq: Two times a day (BID) | ORAL | 0 refills | Status: DC
  Filled 2022-03-19: qty 20, 10d supply, fill #0

## 2022-03-19 MED ORDER — BENZONATATE 100 MG PO CAPS
100.0000 mg | ORAL_CAPSULE | Freq: Three times a day (TID) | ORAL | 0 refills | Status: DC
  Filled 2022-03-19: qty 28, 10d supply, fill #0

## 2022-03-19 MED ORDER — FLUCONAZOLE 150 MG PO TABS: ORAL_TABLET | ORAL | 0 refills | Status: DC

## 2022-03-19 MED ORDER — AMOXICILLIN 500 MG PO CAPS
500.0000 mg | ORAL_CAPSULE | Freq: Two times a day (BID) | ORAL | 0 refills | Status: DC
Start: 2022-03-19 — End: 2023-05-29

## 2022-03-19 NOTE — Discharge Instructions (Addendum)
If any of your test results warrant a change in your plan of care, you may view these test results on MyChart.   Amoxicillin has been sent to the pharmacy, you will take this 2 times a day for the next 10 days, please make sure to eat some food before taking this medicine to avoid stomach upset as a side effect. Tessalon has been sent to the pharmacy for cough, you can use this medication every 8 hours as needed.   Diflucan has been sent to the pharmacy in the case that you develop a yeast infection as a side effects of the antibiotic.   Please follow up if your symptoms are not improving.

## 2022-03-19 NOTE — ED Triage Notes (Signed)
Cough that has been around for 2 weeks and sore throat, headache that started 2 days ago. Taking Theraflu, robitussin and Nyquil.
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
## 2022-03-21 LAB — CULTURE, GROUP A STREP (THRC)

## 2022-07-07 ENCOUNTER — Ambulatory Visit
Admission: RE | Admit: 2022-07-07 | Discharge: 2022-07-07 | Disposition: A | Discharge status: Home / Self Care | Payer: Medicaid Other | Source: Ambulatory Visit | Attending: Urgent Care | Admitting: Urgent Care

## 2022-07-07 VITALS — BP 124/86 | HR 70 | Temp 97.9°F | Resp 18

## 2022-07-07 DIAGNOSIS — B354 Tinea corporis: Secondary | ICD-10-CM

## 2022-07-07 MED ORDER — FLUCONAZOLE 150 MG PO TABS: 300.0000 mg | ORAL_TABLET | Freq: Once | ORAL | 0 refills | Status: AC

## 2022-07-07 MED ORDER — KETOCONAZOLE 2 % EX CREA: 1.0000 | TOPICAL_CREAM | Freq: Every day | CUTANEOUS | 0 refills | Status: AC
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
## 2022-07-07 NOTE — ED Triage Notes (Signed)
Pt has rash to chin x2 weeks. Has used lotrimin and ringworm without relief. Rash is raised and itching.

## 2022-09-07 ENCOUNTER — Other Ambulatory Visit (HOSPITAL_COMMUNITY): Payer: Self-pay

## 2022-09-07 ENCOUNTER — Other Ambulatory Visit (HOSPITAL_COMMUNITY)
Admission: RE | Admit: 2022-09-07 | Discharge: 2022-09-07 | Disposition: A | Discharge status: Home / Self Care | Payer: Medicaid Other | Source: Ambulatory Visit | Attending: Family Medicine | Admitting: Family Medicine

## 2022-09-07 ENCOUNTER — Encounter: Payer: Self-pay | Admitting: Family Medicine

## 2022-09-07 ENCOUNTER — Ambulatory Visit (INDEPENDENT_AMBULATORY_CARE_PROVIDER_SITE_OTHER): Payer: Medicaid Other | Admitting: Family Medicine

## 2022-09-07 VITALS — BP 127/84 | HR 76 | Ht 64.0 in | Wt 172.6 lb

## 2022-09-07 DIAGNOSIS — Z131 Encounter for screening for diabetes mellitus: Secondary | ICD-10-CM

## 2022-09-07 DIAGNOSIS — Z113 Encounter for screening for infections with a predominantly sexual mode of transmission: Secondary | ICD-10-CM | POA: Insufficient documentation

## 2022-09-07 DIAGNOSIS — Z0000 Encounter for general adult medical examination without abnormal findings: Secondary | ICD-10-CM

## 2022-09-07 DIAGNOSIS — Z1159 Encounter for screening for other viral diseases: Secondary | ICD-10-CM

## 2022-09-07 DIAGNOSIS — Z13220 Encounter for screening for lipoid disorders: Secondary | ICD-10-CM

## 2022-09-07 DIAGNOSIS — A6009 Herpesviral infection of other urogenital tract: Secondary | ICD-10-CM | POA: Diagnosis not present

## 2022-09-07 MED ORDER — FLUCONAZOLE 150 MG PO TABS
150.0000 mg | ORAL_TABLET | Freq: Once | ORAL | 0 refills | Status: AC
  Filled 2022-09-07 – 2023-02-24 (×2): qty 2, 2d supply, fill #0
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
## 2022-09-07 NOTE — Patient Instructions (Addendum)
It was great to see you!  Things we discussed at today's visit: - Your vaginal lesion in January was most likely a herpes (HSV) outbreak. This is a virus.  It is very common. Once you have it, you have it. It can flare up from time to time. If you get recurrent outbreaks, let us know and we can prescribe medication. This is not something we can test for unless you're having an outbreak at the present time.  -We are checking some labs and STI testing today. We will send you a MyChart message with the results or call if they are abnormal.   Take care and seek immediate care sooner if you develop any concerns.  Things to do to keep yourself healthy  - Exercise at least 30-45 minutes a day, 3-4 days a week.  - Eat a diet with lots of fruits and vegetables (5 servings per day). - Avoid sugar sweetened beverages and processed foods whenever possible. - Seatbelts can save your life. Wear them always.  - Safe sex - use a condom to prevent STIs. - Alcohol -  If you drink, do it moderately, less than 2 drinks per day. - Sleep - aim for 7-8 hours nightly. - Limit screen time to no more than 2 hours per day.  - Health Care Power of Attorney. Choose someone to make decisions for you if you are not able.  - Depression is common in our stressful world. If you're feeling down or losing interest in things you normally enjoy, please come in for a visit.  - Violence - If anyone is threatening or hurting you, please call immediately.   Dr. [facility]

## 2022-09-08 LAB — CERVICOVAGINAL ANCILLARY ONLY
Chlamydia: NEGATIVE
Comment: NEGATIVE
Comment: NEGATIVE
Comment: NORMAL
Neisseria Gonorrhea: NEGATIVE
Trichomonas: NEGATIVE

## 2022-09-08 LAB — LIPID PANEL
Chol/HDL Ratio: 3.5 ratio (ref 0.0–4.4)
Cholesterol, Total: 173 mg/dL (ref 100–199)
HDL: 50 mg/dL (ref >39)
LDL Chol Calc (NIH): 108 mg/dL — ABNORMAL HIGH (ref 0–99)
Triglycerides: 79 mg/dL (ref 0–149)
VLDL Cholesterol Cal: 15 mg/dL (ref 5–40)

## 2022-09-08 LAB — HEMOGLOBIN A1C
Est. average glucose Bld gHb Est-mCnc: 105 mg/dL
Hgb A1c MFr Bld: 5.3 % (ref 4.8–5.6)

## 2022-09-08 LAB — HCV AB W REFLEX TO QUANT PCR: HCV Ab: NONREACTIVE

## 2022-09-08 LAB — RPR: RPR Ser Ql: NONREACTIVE

## 2022-09-08 LAB — HCV INTERPRETATION

## 2022-09-08 LAB — HIV ANTIBODY (ROUTINE TESTING W REFLEX): HIV Screen 4th Generation wRfx: NONREACTIVE

## 2022-09-08 NOTE — Assessment & Plan Note (Signed)
Per chart review, history of HSV dating back to 2006. Very infrequent outbreaks (approx once every 8-10 years). Suspect episode in January was HSV outbreak. Discussed this with patient. If recurrent, recommend acyclovir.

## 2022-09-09 ENCOUNTER — Other Ambulatory Visit (HOSPITAL_COMMUNITY): Payer: Self-pay

## 2022-09-10 ENCOUNTER — Other Ambulatory Visit (HOSPITAL_COMMUNITY): Payer: Self-pay

## 2022-09-21 ENCOUNTER — Other Ambulatory Visit (HOSPITAL_COMMUNITY): Payer: Self-pay

## 2022-12-10 ENCOUNTER — Other Ambulatory Visit (HOSPITAL_COMMUNITY): Payer: Self-pay

## 2022-12-10 ENCOUNTER — Ambulatory Visit (INDEPENDENT_AMBULATORY_CARE_PROVIDER_SITE_OTHER): Payer: Medicaid Other | Admitting: Student

## 2022-12-10 VITALS — BP 119/86 | HR 72 | Wt 179.4 lb

## 2022-12-10 DIAGNOSIS — A6009 Herpesviral infection of other urogenital tract: Secondary | ICD-10-CM | POA: Diagnosis not present

## 2022-12-10 DIAGNOSIS — F411 Generalized anxiety disorder: Secondary | ICD-10-CM | POA: Diagnosis not present

## 2022-12-10 DIAGNOSIS — R42 Dizziness and giddiness: Secondary | ICD-10-CM

## 2022-12-10 MED ORDER — VALACYCLOVIR HCL 1 G PO TABS
1000.0000 mg | ORAL_TABLET | Freq: Every day | ORAL | 0 refills | Status: DC
  Filled 2022-12-10: qty 34, 34d supply, fill #0
  Filled 2023-01-12 – 2023-02-24 (×2): qty 6, 6d supply, fill #1
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
## 2022-12-10 NOTE — Patient Instructions (Addendum)
[email or web address] to find a therapist. This is how you "learn to swim." You're under a lot of stress. Let me know if you think we've reached the point of needing medication in the future.   And we will have our vestibular rehab folks reach out to you to schedule their formal assessment for your vertigo. Be on the lookout for a call from a number you don't recognize.   Eliezer Mccoy, MD

## 2022-12-12 DIAGNOSIS — R42 Dizziness and giddiness: Secondary | ICD-10-CM | POA: Insufficient documentation

## 2022-12-12 NOTE — Assessment & Plan Note (Addendum)
Symptoms are consistent with vertigo. Likely having increased frequency with the added stressors. Prefers non-pharmacologic interventions.  - Ambulatory referral to vestibular rehab

## 2022-12-12 NOTE — Assessment & Plan Note (Addendum)
Understandably anxious. Plenty of reasons to be. These episodes do not sound like panic attacks. Discussed risks/benefits of pharmacologic vs non-pharm management at present. She is a natural/holistic person and feels comfortable with non-pharmacologic interventions. - Shared psychologytoday.com as a resource for her to find a therapist.  - She knows to reach back out if she feels she needs pharmacologic supports. Has used Klonopin in the past.

## 2022-12-12 NOTE — Assessment & Plan Note (Signed)
Requests refill of Valtrex today. Discussed short courses for acute flares vs suppressive dosing during this stressful period. She prefers to just use her Valtrex PRN for acute flares. - Refill sent with enough supply for several flares. - If frequent flares, would be a good candidate for daily suppressive therapy.

## 2022-12-13 ENCOUNTER — Other Ambulatory Visit (HOSPITAL_COMMUNITY): Payer: Self-pay

## 2022-12-16 ENCOUNTER — Other Ambulatory Visit (HOSPITAL_COMMUNITY): Payer: Self-pay

## 2022-12-22 ENCOUNTER — Ambulatory Visit: Payer: Medicaid Other

## 2022-12-28 ENCOUNTER — Ambulatory Visit: Payer: Medicaid Other | Attending: Family Medicine

## 2023-01-12 ENCOUNTER — Other Ambulatory Visit (HOSPITAL_COMMUNITY): Payer: Self-pay

## 2023-01-24 ENCOUNTER — Other Ambulatory Visit (HOSPITAL_COMMUNITY): Payer: Self-pay

## 2023-02-24 ENCOUNTER — Other Ambulatory Visit (HOSPITAL_COMMUNITY): Payer: Self-pay

## 2023-05-29 ENCOUNTER — Encounter (HOSPITAL_COMMUNITY): Payer: Self-pay

## 2023-05-29 ENCOUNTER — Ambulatory Visit (HOSPITAL_COMMUNITY)
Admission: EM | Admit: 2023-05-29 | Discharge: 2023-05-29 | Disposition: A | Discharge status: Home / Self Care | Attending: Physician Assistant | Admitting: Physician Assistant

## 2023-05-29 DIAGNOSIS — K029 Dental caries, unspecified: Secondary | ICD-10-CM

## 2023-05-29 DIAGNOSIS — K047 Periapical abscess without sinus: Secondary | ICD-10-CM | POA: Diagnosis not present

## 2023-05-29 MED ORDER — MELOXICAM 7.5 MG PO TABS: 7.5000 mg | ORAL_TABLET | Freq: Every day | ORAL | 0 refills | Status: AC

## 2023-05-29 MED ORDER — AMOXICILLIN-POT CLAVULANATE 875-125 MG PO TABS: 1.0000 | ORAL_TABLET | Freq: Two times a day (BID) | ORAL | 0 refills | Status: DC
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
## 2023-05-29 NOTE — Discharge Instructions (Signed)
 You were seen today for concerns for a dental infection.  Your teeth were notable for tenderness and there does appear to be cavities in the teeth that you indicated.  I have sent in a medication called Augmentin for you to take twice per day for 7 days.  This is an antibiotic that is typically very effective dental infections.  Please finish the entire course unless you develop an allergic reaction or are instructed by a healthcare provider to stop taking it.  I have also sent in a medication called meloxicam.  This is in the NSAID class so it is recommended that you do not take other NSAIDs such as ibuprofen, Motrin, Excedrin, Aleve, Advil while taking it.  You can take Tylenol per manufacturer's instructions as needed we did take the meloxicam for breakthrough pain. Please keep your upcoming appointment with your dentist for definitive management of your teeth concerns.  If at any point you start to develop swelling along the gumline or jaw, difficulty swallowing, difficulty breathing, fevers that are not responding to medications please go to the emergency room as these could be signs of a medical emergency.

## 2023-05-29 NOTE — ED Triage Notes (Signed)
 Here for dental pain and swelling x 3 weeks. Patient states she has an upcoming appt to see her dentist.

## 2023-05-29 NOTE — ED Triage Notes (Signed)
 Pt is requesting a refill on Valtrex.

## 2023-06-02 ENCOUNTER — Other Ambulatory Visit: Payer: Self-pay | Admitting: Student

## 2023-06-02 ENCOUNTER — Other Ambulatory Visit (HOSPITAL_COMMUNITY): Payer: Self-pay

## 2023-06-02 DIAGNOSIS — A6009 Herpesviral infection of other urogenital tract: Secondary | ICD-10-CM

## 2023-06-02 MED ORDER — VALACYCLOVIR HCL 1 G PO TABS
1000.0000 mg | ORAL_TABLET | Freq: Every day | ORAL | 0 refills | Status: DC
  Filled 2023-06-02: qty 34, 34d supply, fill #0
  Filled 2023-06-02: qty 40, 40d supply, fill #0

## 2023-06-03 ENCOUNTER — Other Ambulatory Visit (HOSPITAL_COMMUNITY): Payer: Self-pay

## 2023-06-03 ENCOUNTER — Other Ambulatory Visit: Payer: Self-pay

## 2023-06-28 ENCOUNTER — Encounter: Admitting: Family Medicine

## 2023-07-11 ENCOUNTER — Other Ambulatory Visit (HOSPITAL_COMMUNITY): Payer: Self-pay

## 2023-07-11 ENCOUNTER — Encounter (HOSPITAL_COMMUNITY): Payer: Self-pay | Admitting: Emergency Medicine

## 2023-07-11 ENCOUNTER — Ambulatory Visit (HOSPITAL_COMMUNITY)
Admission: EM | Admit: 2023-07-11 | Discharge: 2023-07-11 | Disposition: A | Discharge status: Home / Self Care | Attending: Emergency Medicine | Admitting: Emergency Medicine

## 2023-07-11 DIAGNOSIS — J069 Acute upper respiratory infection, unspecified: Secondary | ICD-10-CM

## 2023-07-11 LAB — POCT RAPID STREP A (OFFICE): Rapid Strep A Screen: NEGATIVE

## 2023-07-11 MED ORDER — BENZONATATE 100 MG PO CAPS
100.0000 mg | ORAL_CAPSULE | Freq: Three times a day (TID) | ORAL | 0 refills | Status: AC
  Filled 2023-07-11: qty 21, 7d supply, fill #0

## 2023-07-11 MED ORDER — PROMETHAZINE-DM 6.25-15 MG/5ML PO SYRP
5.0000 mL | ORAL_SOLUTION | Freq: Every evening | ORAL | 0 refills | Status: DC | PRN
  Filled 2023-07-11: qty 118, 23d supply, fill #0

## 2023-07-11 MED ORDER — AZELASTINE HCL 0.1 % NA SOLN
2.0000 | Freq: Two times a day (BID) | NASAL | 0 refills | Status: AC
Start: 2023-07-11 — End: ?
  Filled 2023-07-11: qty 30, 30d supply, fill #0

## 2023-07-11 NOTE — Discharge Instructions (Signed)
 Rapid strep test was negative, we will send a culture to confirm and if result is positive someone will call you in a few days. I believe your symptoms are likely related to a viral illness.  Take Tessalon  every 8 hours as needed for cough.  Take promethazine  DM cough syrup at bedtime as needed for cough. This can make you drowsy so do not drive, work, or drink alcohol while taking this. Use azelastine  nasal spray twice daily to help with congestion and nasal drainage.  Take Zyrtec  once daily to help with symptoms. Stay hydrated and get some rest. Return here if symptoms persist or worsen.
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
## 2023-07-11 NOTE — ED Triage Notes (Signed)
 Pt reports feels like having cough, congestion, laryngitis for a week. Requesting Tessalon  for her cough. Reports is a strep carrier. Reports took Excedrin for migraine.

## 2023-07-13 LAB — CULTURE, GROUP A STREP (THRC)

## 2023-07-20 ENCOUNTER — Ambulatory Visit (HOSPITAL_COMMUNITY)
Admission: EM | Admit: 2023-07-20 | Discharge: 2023-07-20 | Disposition: A | Discharge status: Home / Self Care | Attending: Emergency Medicine | Admitting: Emergency Medicine

## 2023-07-20 ENCOUNTER — Encounter (HOSPITAL_COMMUNITY): Payer: Self-pay

## 2023-07-20 ENCOUNTER — Ambulatory Visit (INDEPENDENT_AMBULATORY_CARE_PROVIDER_SITE_OTHER)

## 2023-07-20 ENCOUNTER — Other Ambulatory Visit: Payer: Self-pay

## 2023-07-20 DIAGNOSIS — R519 Headache, unspecified: Secondary | ICD-10-CM | POA: Diagnosis not present

## 2023-07-20 DIAGNOSIS — M25562 Pain in left knee: Secondary | ICD-10-CM

## 2023-07-20 LAB — POCT URINE PREGNANCY: Preg Test, Ur: NEGATIVE

## 2023-07-20 MED ORDER — METOCLOPRAMIDE HCL 5 MG/ML IJ SOLN
5.0000 mg | Freq: Once | INTRAMUSCULAR | Status: AC
  Administered 2023-07-20: 5 mg via INTRAMUSCULAR

## 2023-07-20 MED ORDER — DEXAMETHASONE SODIUM PHOSPHATE 10 MG/ML IJ SOLN
INTRAMUSCULAR | Status: AC
  Filled 2023-07-20: qty 1

## 2023-07-20 MED ORDER — KETOROLAC TROMETHAMINE 30 MG/ML IJ SOLN
30.0000 mg | Freq: Once | INTRAMUSCULAR | Status: AC
  Administered 2023-07-20: 30 mg via INTRAMUSCULAR

## 2023-07-20 MED ORDER — DEXAMETHASONE SODIUM PHOSPHATE 10 MG/ML IJ SOLN
10.0000 mg | Freq: Once | INTRAMUSCULAR | Status: AC
  Administered 2023-07-20: 10 mg via INTRAMUSCULAR

## 2023-07-20 MED ORDER — METOCLOPRAMIDE HCL 5 MG/ML IJ SOLN
INTRAMUSCULAR | Status: AC
  Filled 2023-07-20: qty 2

## 2023-07-20 MED ORDER — KETOROLAC TROMETHAMINE 30 MG/ML IJ SOLN
INTRAMUSCULAR | Status: AC
  Filled 2023-07-20: qty 1

## 2023-07-20 NOTE — ED Triage Notes (Signed)
 Pt states she has been having LT leg pain (from knee down) for the last 3 days. Pt states that there is swelling in her leg.Pt states that she stands, walks, and lifts a lot and she's not sure if she injured it doing something at work. Pt has take OTC Ibuprofen , Bengay, elevation, and ice at home and its not working. Pt states that her pain level is a 7/10.

## 2023-07-20 NOTE — Discharge Instructions (Signed)
 Your x-ray did not show any acute bony abnormalities.  Wear the knee sleeve to help provide compression.  Rest, ice and elevate the knee.  You can take 800 mg of ibuprofen  every 8 hours starting tomorrow.  For your headache we have given you a mixture of medications in clinic.  Go home and get some rest.  Do not take any Aleve , ibuprofen  or other NSAIDs for the rest of today, as you have already had Toradol .  Follow-up with your primary care provider for reoccurring headaches.  Consider referral back to orthopedic for any continued knee pain.
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
## 2023-09-29 ENCOUNTER — Other Ambulatory Visit: Payer: Self-pay | Admitting: Family Medicine

## 2023-09-29 DIAGNOSIS — A6009 Herpesviral infection of other urogenital tract: Secondary | ICD-10-CM

## 2023-09-30 MED ORDER — VALACYCLOVIR HCL 1 G PO TABS
1000.0000 mg | ORAL_TABLET | Freq: Every day | ORAL | 0 refills | Status: AC
  Filled 2023-09-30: qty 30, 30d supply, fill #0
  Filled 2024-01-08 – 2024-03-26 (×2): qty 10, 10d supply, fill #1

## 2023-09-30 NOTE — Telephone Encounter (Signed)
 Will refill for now. Recommend following up if multiple recurrences -- patient may be good candidate for daily suppressive therapy.

## 2023-10-02 ENCOUNTER — Other Ambulatory Visit (HOSPITAL_COMMUNITY): Payer: Self-pay

## 2023-10-05 ENCOUNTER — Other Ambulatory Visit (HOSPITAL_COMMUNITY): Payer: Self-pay

## 2023-10-18 ENCOUNTER — Other Ambulatory Visit: Payer: Self-pay | Admitting: Family Medicine

## 2023-10-18 DIAGNOSIS — Z1231 Encounter for screening mammogram for malignant neoplasm of breast: Secondary | ICD-10-CM

## 2023-10-19 ENCOUNTER — Other Ambulatory Visit: Payer: Self-pay | Admitting: Family Medicine

## 2023-10-19 DIAGNOSIS — Z1231 Encounter for screening mammogram for malignant neoplasm of breast: Secondary | ICD-10-CM

## 2023-10-24 ENCOUNTER — Other Ambulatory Visit: Payer: Self-pay | Admitting: Family Medicine

## 2023-10-24 DIAGNOSIS — Z1231 Encounter for screening mammogram for malignant neoplasm of breast: Secondary | ICD-10-CM

## 2023-10-28 ENCOUNTER — Encounter

## 2023-10-28 DIAGNOSIS — Z1231 Encounter for screening mammogram for malignant neoplasm of breast: Secondary | ICD-10-CM

## 2023-11-03 ENCOUNTER — Encounter

## 2023-11-03 DIAGNOSIS — Z1231 Encounter for screening mammogram for malignant neoplasm of breast: Secondary | ICD-10-CM

## 2024-01-08 ENCOUNTER — Other Ambulatory Visit (HOSPITAL_COMMUNITY): Payer: Self-pay

## 2024-01-09 ENCOUNTER — Other Ambulatory Visit (HOSPITAL_COMMUNITY): Payer: Self-pay

## 2024-01-10 ENCOUNTER — Ambulatory Visit: Admitting: Family Medicine

## 2024-01-17 ENCOUNTER — Ambulatory Visit: Admitting: Family Medicine

## 2024-01-18 ENCOUNTER — Other Ambulatory Visit (HOSPITAL_COMMUNITY): Payer: Self-pay

## 2024-03-22 ENCOUNTER — Telehealth: Admitting: Physician Assistant

## 2024-03-22 DIAGNOSIS — R3989 Other symptoms and signs involving the genitourinary system: Secondary | ICD-10-CM | POA: Diagnosis not present

## 2024-03-22 MED ORDER — PHENAZOPYRIDINE HCL 100 MG PO TABS: 100.0000 mg | ORAL_TABLET | Freq: Three times a day (TID) | ORAL | 0 refills | Status: AC | PRN

## 2024-03-22 MED ORDER — FLUCONAZOLE 150 MG PO TABS: ORAL_TABLET | ORAL | 0 refills | Status: AC

## 2024-03-22 MED ORDER — CEPHALEXIN 500 MG PO CAPS: 500.0000 mg | ORAL_CAPSULE | Freq: Two times a day (BID) | ORAL | 0 refills | Status: DC

## 2024-03-22 NOTE — Progress Notes (Signed)
 " Virtual Visit Consent   Heidi [name], you are scheduled for a virtual visit with a [facility] provider today. Just as with appointments in the office, your consent must be obtained to participate. Your consent will be active for this visit and any virtual visit you may have with one of our providers in the next 365 days. If you have a MyChart account, a copy of this consent can be sent to you electronically.  As this is a virtual visit, video technology does not allow for your provider to perform a traditional examination. This may limit your provider's ability to fully assess your condition. If your provider identifies any concerns that need to be evaluated in person or the need to arrange testing (such as labs, EKG, etc.), we will make arrangements to do so. Although advances in technology are sophisticated, we cannot ensure that it will always work on either your end or our end. If the connection with a video visit is poor, the visit may have to be switched to a telephone visit. With either a video or telephone visit, we are not always able to ensure that we have a secure connection.  By engaging in this virtual visit, you consent to the provision of healthcare and authorize for your insurance to be billed (if applicable) for the services provided during this visit. Depending on your insurance coverage, you may receive a charge related to this service.  I need to obtain your verbal consent now. Are you willing to proceed with your visit today? Heidi [name] has provided verbal consent on 03/22/2024 for a virtual visit (video or telephone). Heidi [name], [place]  Date: 03/22/2024 4:31 PM   Virtual Visit via Video Note   I, Heidi [name], connected with  Heidi [name]  ([id number], 07-09-1980) on 03/22/2024 at  4:30 PM EST by a video-enabled telemedicine application and verified that I am speaking with the correct person using two identifiers.  Location: Patient: Virtual Visit  Location Patient: Home Provider: Virtual Visit Location Provider: Home Office   I discussed the limitations of evaluation and management by telemedicine and the availability of in person appointments. The patient expressed understanding and agreed to proceed.    History of Present Illness: Heidi [name] is a 44 y.o. who identifies as a female who was assigned female at birth, and is being seen today for possible UTI. Endorses symptoms onset yesterday with dysuria, urgency/frequency, hematuria. Denies fever, chills, nausea or vomiting. Some low back pain. LMP 2-3 weeks ago. Denies concern for pregnancy. Notes she does not hydrate the best while at work.  HPI: HPI  Problems:  Patient Active Problem List   Diagnosis Date Noted   Vertigo 12/12/2022   Breast abscess 08/12/2016   Obesity 06/12/2014   Bladder prolapse, female, acquired 08/23/2013   Allergic rhinitis 09/19/2011   Herpes genitalis in women 05/12/2011   Bipolar depression (HCC) 04/07/2011   Ovarian cyst 07/08/2010   Chronic headache 07/07/2010   GERD (gastroesophageal reflux disease) 05/22/2010   ANEMIA 09/02/2009   Anxiety state 05/19/2006   DEPRESSIVE DISORDER, NOS 05/19/2006    Allergies: Allergies[1] Medications: Current Medications[2]  Observations/Objective: Patient is well-developed, well-nourished in no acute distress.  Resting comfortably at home.  Head is normocephalic, atraumatic.  No labored breathing.  Speech is clear and coherent with logical content.  Patient is alert and oriented at baseline.  Assessment and Plan: 1. Suspected UTI (Primary) - cephALEXin  (KEFLEX ) 500 MG capsule; Take 1 capsule (500  mg total) by mouth 2 (two) times daily for 7 days.  Dispense: 14 capsule; Refill: 0 - phenazopyridine (PYRIDIUM) 100 MG tablet; Take 1 tablet (100 mg total) by mouth 3 (three) times daily as needed for pain.  Dispense: 10 tablet; Refill: 0 - fluconazole  (DIFLUCAN ) 150 MG tablet; Take 1 tablet PO once.  Repeat in 3 days if needed.  Dispense: 2 tablet; Refill: 0  Classic UTI symptoms with absence of alarm signs or symptoms. Prior history of UTI. Will treat empirically with Keflex  for suspected uncomplicated cystitis. Pyridium per orders.  Supportive measures and OTC medications reviewed. Strict in-person evaluation precautions discussed.    Follow Up Instructions: I discussed the assessment and treatment plan with the patient. The patient was provided an opportunity to ask questions and all were answered. The patient agreed with the plan and demonstrated an understanding of the instructions.  A copy of instructions were sent to the patient via MyChart unless otherwise noted below.    The patient was advised to call back or seek an in-person evaluation if the symptoms worsen or if the condition fails to improve as anticipated.    Heidi Velma Lunger, PA-C    [1]  Allergies Allergen Reactions   Morphine And Codeine  Hives and Shortness Of Breath   Latex Hives   Prozac  [Fluoxetine  Hcl]     SI   Valium Other (See Comments)    does not do well with me   Epinephrine Palpitations  [2]  Current Outpatient Medications:    cephALEXin  (KEFLEX ) 500 MG capsule, Take 1 capsule (500 mg total) by mouth 2 (two) times daily for 7 days., Disp: 14 capsule, Rfl: 0   fluconazole  (DIFLUCAN ) 150 MG tablet, Take 1 tablet PO once. Repeat in 3 days if needed., Disp: 2 tablet, Rfl: 0   phenazopyridine (PYRIDIUM) 100 MG tablet, Take 1 tablet (100 mg total) by mouth 3 (three) times daily as needed for pain., Disp: 10 tablet, Rfl: 0   albuterol  (VENTOLIN  HFA) 108 (90 Base) MCG/ACT inhaler, Inhale 2 puffs into the lungs every 6 (six) hours as needed for wheezing or shortness of breath., Disp: 8 g, Rfl: 0   aspirin-acetaminophen -caffeine (EXCEDRIN MIGRAINE) 250-250-65 MG tablet, Take 1 tablet by mouth every 6 (six) hours as needed for headache., Disp: , Rfl:    azelastine  (ASTELIN ) 0.1 % nasal spray, Place 2 sprays  into both nostrils 2 (two) times daily. Use in each nostril as directed, Disp: 30 mL, Rfl: 0   benzonatate  (TESSALON ) 100 MG capsule, Take 1 capsule (100 mg total) by mouth every 8 (eight) hours., Disp: 21 capsule, Rfl: 0   cetirizine  (ZYRTEC ) 10 MG tablet, Take 1 tablet (10 mg total) by mouth daily for 10 days., Disp: 30 tablet, Rfl: 0   fluticasone  (FLONASE ) 50 MCG/ACT nasal spray, Place 1 spray into both nostrils daily for 3 days., Disp: 16 g, Rfl: 0   ketoconazole  (NIZORAL ) 2 % cream, Apply 1 Application topically daily., Disp: 30 g, Rfl: 0   meloxicam  (MOBIC ) 7.5 MG tablet, Take 1 tablet (7.5 mg total) by mouth daily., Disp: 30 tablet, Rfl: 0   valACYclovir  (VALTREX ) 1000 MG tablet, Take 1 tablet (1,000 mg total) by mouth daily for 5 days at the earliest sign of a flare., Disp: 40 tablet, Rfl: 0  "

## 2024-03-22 NOTE — Patient Instructions (Signed)
 " Heidi [name], thank you for joining [name], PA-C for today's virtual visit.  While this provider is not your primary care provider (PCP), if your PCP is located in our provider database this encounter information will be shared with them immediately following your visit.   A [facility] MyChart account gives you access to today's visit and all your visits, tests, and labs performed at [facility]  click here if you don't have a [facility] MyChart account or go to [email or web address]  Consent: (Patient) Heidi [name] provided verbal consent for this virtual visit at the beginning of the encounter.  Current Medications:  Current Outpatient Medications:    albuterol  (VENTOLIN  HFA) 108 (90 Base) MCG/ACT inhaler, Inhale 2 puffs into the lungs every 6 (six) hours as needed for wheezing or shortness of breath., Disp: 8 g, Rfl: 0   aspirin-acetaminophen -caffeine (EXCEDRIN MIGRAINE) 250-250-65 MG tablet, Take 1 tablet by mouth every 6 (six) hours as needed for headache., Disp: , Rfl:    azelastine  (ASTELIN ) 0.1 % nasal spray, Place 2 sprays into both nostrils 2 (two) times daily. Use in each nostril as directed, Disp: 30 mL, Rfl: 0   benzonatate  (TESSALON ) 100 MG capsule, Take 1 capsule (100 mg total) by mouth every 8 (eight) hours., Disp: 21 capsule, Rfl: 0   cetirizine  (ZYRTEC ) 10 MG tablet, Take 1 tablet (10 mg total) by mouth daily for 10 days., Disp: 30 tablet, Rfl: 0   fluticasone  (FLONASE ) 50 MCG/ACT nasal spray, Place 1 spray into both nostrils daily for 3 days., Disp: 16 g, Rfl: 0   ketoconazole  (NIZORAL ) 2 % cream, Apply 1 Application topically daily., Disp: 30 g, Rfl: 0   meloxicam  (MOBIC ) 7.5 MG tablet, Take 1 tablet (7.5 mg total) by mouth daily., Disp: 30 tablet, Rfl: 0   promethazine -dextromethorphan  (PROMETHAZINE -DM) 6.25-15 MG/5ML syrup, Take 5 mLs by mouth at bedtime as needed for cough., Disp: 118 mL, Rfl: 0   valACYclovir  (VALTREX ) 1000 MG  tablet, Take 1 tablet (1,000 mg total) by mouth daily for 5 days at the earliest sign of a flare., Disp: 40 tablet, Rfl: 0   Medications ordered in this encounter:  No orders of the defined types were placed in this encounter.    *If you need refills on other medications prior to your next appointment, please contact your pharmacy*  Follow-Up: Call back or seek an in-person evaluation if the symptoms worsen or if the condition fails to improve as anticipated.  [facility] [phone]  Other Instructions Your symptoms are consistent with a bladder infection, also called acute cystitis. Please take your antibiotic (Keflex ) as directed until all pills are gone.  Stay very well hydrated.  Consider a daily probiotic (Align, Culturelle, or Activia) to help prevent stomach upset caused by the antibiotic.  Taking a probiotic daily may also help prevent recurrent UTIs.  Also consider taking AZO (Phenazopyridine) tablets to help decrease pain with urination. If you note any non-resolving, new, or worsening symptoms despite treatment, please seek an in-person evaluation ASAP.   Urinary Tract Infection A urinary tract infection (UTI) can occur any place along the urinary tract. The tract includes the kidneys, ureters, bladder, and urethra. A type of germ called bacteria often causes a UTI. UTIs are often helped with antibiotic medicine.  HOME CARE  If given, take antibiotics as told by your doctor. Finish them even if you start to feel better. Drink enough fluids to keep your pee (urine) clear or  pale yellow. Avoid tea, drinks with caffeine, and bubbly (carbonated) drinks. Pee often. Avoid holding your pee in for a long time. Pee before and after having sex (intercourse). Wipe from front to back after you poop (bowel movement) if you are a woman. Use each tissue only once. GET HELP RIGHT AWAY IF:  You have back pain. You have lower belly (abdominal) pain. You have chills. You feel  sick to your stomach (nauseous). You throw up (vomit). Your burning or discomfort with peeing does not go away. You have a fever. Your symptoms are not better in 3 days. MAKE SURE YOU:  Understand these instructions. Will watch your condition. Will get help right away if you are not doing well or get worse. Document Released: 08/25/2007 Document Revised: 12/01/2011 Document Reviewed: 10/07/2011 [facility] Patient Information 2015 [place], [place]. This information is not intended to replace advice given to you by your health care provider. Make sure you discuss any questions you have with your health care provider.    If you have been instructed to have an in-person evaluation today at a local Urgent Care facility, please use the link below. It will take you to a list of all of our available [facility], including address, phone number and hours of operation. Please do not delay care.  [facility]  If you or a family member do not have a primary care provider, use the link below to schedule a visit and establish care. When you choose a [facility] primary care physician or advanced practice provider, you gain a long-term partner in health. Find a Primary Care Provider  Learn more about [facility] in-office and virtual care options: [facility] - Get Care Now  "

## 2024-03-26 ENCOUNTER — Other Ambulatory Visit (HOSPITAL_COMMUNITY): Payer: Self-pay

## 2024-03-26 ENCOUNTER — Telehealth: Admitting: Physician Assistant

## 2024-03-26 DIAGNOSIS — N390 Urinary tract infection, site not specified: Secondary | ICD-10-CM

## 2024-03-26 DIAGNOSIS — Z5321 Procedure and treatment not carried out due to patient leaving prior to being seen by health care provider: Secondary | ICD-10-CM

## 2024-03-26 NOTE — Progress Notes (Signed)
" °  Because of failing a first-line treatment, I feel your condition warrants further evaluation and I recommend that you be seen in a face-to-face visit to have a Urine Culture obtaine to rule out multi-drug resistant bacteria.   NOTE: There will be NO CHARGE for this E-Visit   If you are having a true medical emergency, please call 911.     For an urgent face to face visit, [facility] has multiple urgent care centers for your convenience.  Click the link below for the full list of locations and hours, walk-in wait times, appointment scheduling options and driving directions:  Urgent Care - [place], [place], [place], [place], [place], [place]  [place]     Your MyChart E-visit questionnaire answers were reviewed by a board certified advanced clinical practitioner to complete your personal care plan based on your specific symptoms.    Thank you for using e-Visits.    "
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
## 2024-03-26 NOTE — Progress Notes (Signed)
 Patient arrived for video appt 2 minutes after appointment time but driving. Was instructed would give her a few minutes to park. Left video and then rejoined 10 minutes after appt. Patient has camera and microphone both off and not responding to provider. Will close encounter and mark no charge but see if she returns later.

## 2024-03-27 ENCOUNTER — Encounter: Payer: Self-pay | Admitting: Family Medicine

## 2024-03-27 ENCOUNTER — Ambulatory Visit (INDEPENDENT_AMBULATORY_CARE_PROVIDER_SITE_OTHER): Admitting: Family Medicine

## 2024-03-27 ENCOUNTER — Other Ambulatory Visit (HOSPITAL_COMMUNITY): Payer: Self-pay

## 2024-03-27 VITALS — BP 133/85 | HR 93 | Temp 98.1°F | Ht 64.0 in | Wt 179.8 lb

## 2024-03-27 DIAGNOSIS — R399 Unspecified symptoms and signs involving the genitourinary system: Secondary | ICD-10-CM | POA: Diagnosis present

## 2024-03-27 LAB — POCT URINALYSIS DIP (MANUAL ENTRY)
Bilirubin, UA: NEGATIVE
Glucose, UA: NEGATIVE mg/dL
Ketones, POC UA: NEGATIVE mg/dL
Nitrite, UA: NEGATIVE
Protein Ur, POC: 30 mg/dL — AB
Spec Grav, UA: 1.02
Urobilinogen, UA: 0.2 U/dL
pH, UA: 5.5

## 2024-03-27 LAB — POCT UA - MICROSCOPIC ONLY: RBC, Urine, Miroscopic: >20

## 2024-03-27 MED ORDER — AMOXICILLIN-POT CLAVULANATE 875-125 MG PO TABS
1.0000 | ORAL_TABLET | Freq: Two times a day (BID) | ORAL | 0 refills | Status: AC
  Filled 2024-03-27: qty 14, 7d supply, fill #0

## 2024-03-27 NOTE — Patient Instructions (Addendum)
 It was great to see you today! Thank you for choosing [facility] for your primary care. Heidi [name] was seen for UTI symptoms.  Today we addressed: Augmentin  875-125 twice daily for 7 days Once culture returns we will see if your antibiotic needs to be changed or see if your symptoms have improved, if not we may consider a CT scan at that time.  We are checking some labs today. I will send you a MyChart message with your results, per your preference. If you do not hear about your labs in the next 2 weeks, please call the office.  You should return to our clinic Return if symptoms worsen or fail to improve. Please arrive 15 minutes before your appointment to ensure smooth check in process.  We appreciate your efforts in making this happen.  Thank you for allowing me to participate in your care, Kathrine Melena, DO 03/27/2024, 9:15 AM PGY-2, [facility]

## 2024-03-28 ENCOUNTER — Ambulatory Visit: Payer: Self-pay | Admitting: Family Medicine

## 2024-03-28 LAB — BASIC METABOLIC PANEL WITH GFR
BUN/Creatinine Ratio: 15 (ref 9–23)
BUN: 14 mg/dL (ref 6–24)
CO2: 21 mmol/L (ref 20–29)
Calcium: 9.5 mg/dL (ref 8.7–10.2)
Chloride: 107 mmol/L — ABNORMAL HIGH (ref 96–106)
Creatinine, Ser: 0.96 mg/dL (ref 0.57–1.00)
Glucose: 92 mg/dL (ref 70–99)
Potassium: 4.4 mmol/L (ref 3.5–5.2)
Sodium: 141 mmol/L (ref 134–144)
eGFR: 75 mL/min/1.73

## 2024-03-29 LAB — URINE CULTURE

## 2024-04-06 ENCOUNTER — Telehealth: Admitting: Physician Assistant

## 2024-04-06 ENCOUNTER — Other Ambulatory Visit (HOSPITAL_COMMUNITY): Payer: Self-pay

## 2024-04-06 DIAGNOSIS — M5442 Lumbago with sciatica, left side: Secondary | ICD-10-CM

## 2024-04-06 MED ORDER — METHYLPREDNISOLONE 4 MG PO TBPK
ORAL_TABLET | ORAL | 0 refills | Status: AC
  Filled 2024-04-06: qty 21, 6d supply, fill #0

## 2024-04-06 MED ORDER — CYCLOBENZAPRINE HCL 10 MG PO TABS
5.0000 mg | ORAL_TABLET | Freq: Three times a day (TID) | ORAL | 0 refills | Status: AC | PRN
  Filled 2024-04-06: qty 30, 10d supply, fill #0

## 2024-04-06 NOTE — Patient Instructions (Signed)
 " Renea LOISE Pouch, thank you for joining [name], PA-C for today's virtual visit.  While this provider is not your primary care provider (PCP), if your PCP is located in our provider database this encounter information will be shared with them immediately following your visit.   A [facility] MyChart account gives you access to today's visit and all your visits, tests, and labs performed at [facility]  click here if you don't have a [facility] MyChart account or go to [email or web address]  Consent: (Patient) Renea LOISE Pouch provided verbal consent for this virtual visit at the beginning of the encounter.  Current Medications:  Current Outpatient Medications:    cyclobenzaprine  (FLEXERIL ) 10 MG tablet, Take 0.5-1 tablets (5-10 mg total) by mouth 3 (three) times daily as needed., Disp: 30 tablet, Rfl: 0   methylPREDNISolone  (MEDROL  DOSEPAK) 4 MG TBPK tablet, Take 6 tablets (24 mg total) by mouth daily for 1 day, THEN 5 tablets (20 mg total) daily for 1 day, THEN 4 tablets (16 mg total) daily for 1 day, THEN 3 tablets (12 mg total) daily for 1 day, THEN 2 tablets (8 mg total) daily for 1 day, THEN 1 tablet (4 mg total) daily for 1 day., Disp: 21 tablet, Rfl: 0   albuterol  (VENTOLIN  HFA) 108 (90 Base) MCG/ACT inhaler, Inhale 2 puffs into the lungs every 6 (six) hours as needed for wheezing or shortness of breath., Disp: 8 g, Rfl: 0   aspirin-acetaminophen -caffeine (EXCEDRIN MIGRAINE) 250-250-65 MG tablet, Take 1 tablet by mouth every 6 (six) hours as needed for headache., Disp: , Rfl:    azelastine  (ASTELIN ) 0.1 % nasal spray, Place 2 sprays into both nostrils 2 (two) times daily. Use in each nostril as directed, Disp: 30 mL, Rfl: 0   benzonatate  (TESSALON ) 100 MG capsule, Take 1 capsule (100 mg total) by mouth every 8 (eight) hours., Disp: 21 capsule, Rfl: 0   cetirizine  (ZYRTEC ) 10 MG tablet, Take 1 tablet (10 mg total) by mouth daily for 10 days., Disp: 30 tablet,  Rfl: 0   fluconazole  (DIFLUCAN ) 150 MG tablet, Take 1 tablet PO once. Repeat in 3 days if needed., Disp: 2 tablet, Rfl: 0   fluticasone  (FLONASE ) 50 MCG/ACT nasal spray, Place 1 spray into both nostrils daily for 3 days., Disp: 16 g, Rfl: 0   ketoconazole  (NIZORAL ) 2 % cream, Apply 1 Application topically daily., Disp: 30 g, Rfl: 0   meloxicam  (MOBIC ) 7.5 MG tablet, Take 1 tablet (7.5 mg total) by mouth daily., Disp: 30 tablet, Rfl: 0   phenazopyridine  (PYRIDIUM ) 100 MG tablet, Take 1 tablet (100 mg total) by mouth 3 (three) times daily as needed for pain., Disp: 10 tablet, Rfl: 0   valACYclovir  (VALTREX ) 1000 MG tablet, Take 1 tablet (1,000 mg total) by mouth daily for 5 days at the earliest sign of a flare., Disp: 40 tablet, Rfl: 0   Medications ordered in this encounter:  Meds ordered this encounter  Medications   methylPREDNISolone  (MEDROL  DOSEPAK) 4 MG TBPK tablet    Sig: Take 6 tablets (24 mg total) by mouth daily for 1 day, THEN 5 tablets (20 mg total) daily for 1 day, THEN 4 tablets (16 mg total) daily for 1 day, THEN 3 tablets (12 mg total) daily for 1 day, THEN 2 tablets (8 mg total) daily for 1 day, THEN 1 tablet (4 mg total) daily for 1 day.    Dispense:  21 tablet    Refill:  0  Supervising Provider:   LAMPTEY, PHILIP O [[id number]]   cyclobenzaprine  (FLEXERIL ) 10 MG tablet    Sig: Take 0.5-1 tablets (5-10 mg total) by mouth 3 (three) times daily as needed.    Dispense:  30 tablet    Refill:  0    Supervising Provider:   LAMPTEY, PHILIP O [[id number]]     *If you need refills on other medications prior to your next appointment, please contact your pharmacy*  Follow-Up: Call back or seek an in-person evaluation if the symptoms worsen or if the condition fails to improve as anticipated.  [facility] [phone]  Other Instructions Back Exercises The following exercises strengthen the muscles that help to support the trunk (torso) and back. They also help to keep  the lower back flexible. Doing these exercises can help to prevent or lessen existing low back pain. If you have back pain or discomfort, try doing these exercises 2-3 times each day or as told by your health care provider. As your pain improves, do them once each day, but increase the number of times that you repeat the steps for each exercise (do more repetitions). To prevent the recurrence of back pain, continue to do these exercises once each day or as told by your health care provider. Do exercises exactly as told by your health care provider and adjust them as directed. It is normal to feel mild stretching, pulling, tightness, or discomfort as you do these exercises, but you should stop right away if you feel sudden pain or your pain gets worse. Exercises Single knee to chest Repeat these steps 3-5 times for each leg: Lie on your back on a firm bed or the floor with your legs extended. Bring one knee to your chest. Your other leg should stay extended and in contact with the floor. Hold your knee in place by grabbing your knee or thigh with both hands and hold. Pull on your knee until you feel a gentle stretch in your lower back or buttocks. Hold the stretch for 10-30 seconds. Slowly release and straighten your leg.  Pelvic tilt Repeat these steps 5-10 times: Lie on your back on a firm bed or the floor with your legs extended. Bend your knees so they are pointing toward the ceiling and your feet are flat on the floor. Tighten your lower abdominal muscles to press your lower back against the floor. This motion will tilt your pelvis so your tailbone points up toward the ceiling instead of pointing to your feet or the floor. With gentle tension and even breathing, hold this position for 5-10 seconds.  Cat-cow Repeat these steps until your lower back becomes more flexible: Get into a hands-and-knees position on a firm bed or the floor. Keep your hands under your shoulders, and keep your  knees under your hips. You may place padding under your knees for comfort. Let your head hang down toward your chest. Contract your abdominal muscles and point your tailbone toward the floor so your lower back becomes rounded like the back of a cat. Hold this position for 5 seconds. Slowly lift your head, let your abdominal muscles relax, and point your tailbone up toward the ceiling so your back forms a sagging arch like the back of a cow. Hold this position for 5 seconds.  Press-ups Repeat these steps 5-10 times: Lie on your abdomen (face-down) on a firm bed or the floor. Place your palms near your head, about shoulder-width apart. Keeping your back as relaxed  as possible and keeping your hips on the floor, slowly straighten your arms to raise the top half of your body and lift your shoulders. Do not use your back muscles to raise your upper torso. You may adjust the placement of your hands to make yourself more comfortable. Hold this position for 5 seconds while you keep your back relaxed. Slowly return to lying flat on the floor.  Bridges Repeat these steps 10 times: Lie on your back on a firm bed or the floor. Bend your knees so they are pointing toward the ceiling and your feet are flat on the floor. Your arms should be flat at your sides, next to your body. Tighten your buttocks muscles and lift your buttocks off the floor until your waist is at almost the same height as your knees. You should feel the muscles working in your buttocks and the back of your thighs. If you do not feel these muscles, slide your feet 1-2 inches (2.5-5 cm) farther away from your buttocks. Hold this position for 3-5 seconds. Slowly lower your hips to the starting position, and allow your buttocks muscles to relax completely. If this exercise is too easy, try doing it with your arms crossed over your chest. Abdominal crunches Repeat these steps 5-10 times: Lie on your back on a firm bed or the floor with your  legs extended. Bend your knees so they are pointing toward the ceiling and your feet are flat on the floor. Cross your arms over your chest. Tip your chin slightly toward your chest without bending your neck. Tighten your abdominal muscles and slowly raise your torso high enough to lift your shoulder blades a tiny bit off the floor. Avoid raising your torso higher than that because it can put too much stress on your lower back and does not help to strengthen your abdominal muscles. Slowly return to your starting position.  Back lifts Repeat these steps 5-10 times: Lie on your abdomen (face-down) with your arms at your sides, and rest your forehead on the floor. Tighten the muscles in your legs and your buttocks. Slowly lift your chest off the floor while you keep your hips pressed to the floor. Keep the back of your head in line with the curve in your back. Your eyes should be looking at the floor. Hold this position for 3-5 seconds. Slowly return to your starting position.  Contact a health care provider if: Your back pain or discomfort gets much worse when you do an exercise. Your worsening back pain or discomfort does not lessen within 2 hours after you exercise. If you have any of these problems, stop doing these exercises right away. Do not do them again unless your health care provider says that you can. Get help right away if: You develop sudden, severe back pain. If this happens, stop doing the exercises right away. Do not do them again unless your health care provider says that you can. This information is not intended to replace advice given to you by your health care provider. Make sure you discuss any questions you have with your health care provider. Document Revised: 04/11/2022 Document Reviewed: 05/21/2020 Elsevier Patient Education  2024 Elsevier Inc.   If you have been instructed to have an in-person evaluation today at a local Urgent Care facility, please use the link  below. It will take you to a list of all of our available [facility], including address, phone number and hours of operation. Please do not  delay care.  [facility]  If you or a family member do not have a primary care provider, use the link below to schedule a visit and establish care. When you choose a [facility] primary care physician or advanced practice provider, you gain a long-term partner in health. Find a Primary Care Provider  Learn more about [facility]'s in-office and virtual care options: [facility] - Get Care Now "

## 2024-04-06 NOTE — Progress Notes (Signed)
 " Virtual Visit Consent   DELONDA [name], you are scheduled for a virtual visit with a  provider today. Just as with appointments in the office, your consent must be obtained to participate. Your consent will be active for this visit and any virtual visit you may have with one of our providers in the next 365 days. If you have a MyChart account, a copy of this consent can be sent to you electronically.  As this is a virtual visit, video technology does not allow for your provider to perform a traditional examination. This may limit your provider's ability to fully assess your condition. If your provider identifies any concerns that need to be evaluated in person or the need to arrange testing (such as labs, EKG, etc.), we will make arrangements to do so. Although advances in technology are sophisticated, we cannot ensure that it will always work on either your end or our end. If the connection with a video visit is poor, the visit may have to be switched to a telephone visit. With either a video or telephone visit, we are not always able to ensure that we have a secure connection.  By engaging in this virtual visit, you consent to the provision of healthcare and authorize for your insurance to be billed (if applicable) for the services provided during this visit. Depending on your insurance coverage, you may receive a charge related to this service.  I need to obtain your verbal consent now. Are you willing to proceed with your visit today? Heidi [name] has provided verbal consent on 04/06/2024 for a virtual visit (video or telephone). Heidi Heidi Dickinson, PA-C  Date: 04/06/2024 8:46 AM   Virtual Visit via Video Note   I, Heidi [name], connected with  Heidi [name]  ([id number], 1980-06-12) on 04/06/24 at  8:30 AM EST by a video-enabled telemedicine application and verified that I am speaking with the correct person using two identifiers.  Location: Patient: Virtual Visit  Location Patient: Home Provider: Virtual Visit Location Provider: Home Office   I discussed the limitations of evaluation and management by telemedicine and the availability of in person appointments. The patient expressed understanding and agreed to proceed.    History of Present Illness: Heidi [name] is a 44 y.o. who identifies as a female who was assigned female at birth, and is being seen today for back pain.  HPI: Back Pain This is a new problem. The current episode started in the past 7 days (5 days). The problem occurs constantly. The problem has been gradually worsening since onset. The pain is present in the lumbar spine and gluteal (left). The quality of the pain is described as burning, aching and stabbing. The pain radiates to the left thigh, left foot and left knee. The pain is moderate. The pain is The same all the time. The symptoms are aggravated by twisting, standing, sitting, position and bending. Stiffness is present All day. Associated symptoms include leg pain (left) and tingling. Pertinent negatives include no bladder incontinence, bowel incontinence, dysuria (did just recover from a UTI), fever, numbness, paresis, paresthesias, pelvic pain, perianal numbness or weakness. She has tried NSAIDs, heat, ice and home exercises (ibuprofen , tylenol ) for the symptoms. The treatment provided no relief.    Problems:  Patient Active Problem List   Diagnosis Date Noted   Vertigo 12/12/2022   Breast abscess 08/12/2016   Obesity 06/12/2014   Bladder prolapse, female, acquired 08/23/2013   Allergic rhinitis 09/19/2011  Herpes genitalis in women 05/12/2011   Bipolar depression (HCC) 04/07/2011   Ovarian cyst 07/08/2010   Chronic headache 07/07/2010   GERD (gastroesophageal reflux disease) 05/22/2010   ANEMIA 09/02/2009   Anxiety state 05/19/2006   DEPRESSIVE DISORDER, NOS 05/19/2006    Allergies: Allergies[1] Medications: Current  Medications[2]  Observations/Objective: Patient is well-developed, well-nourished in no acute distress.  Resting comfortably at home.  Head is normocephalic, atraumatic.  No labored breathing.  Speech is clear and coherent with logical content.  Patient is alert and oriented at baseline.    Assessment and Plan: 1. Acute left-sided low back pain with left-sided sciatica (Primary) - methylPREDNISolone  (MEDROL  DOSEPAK) 4 MG TBPK tablet; Take 6 tablets (24 mg total) by mouth daily for 1 day, THEN 5 tablets (20 mg total) daily for 1 day, THEN 4 tablets (16 mg total) daily for 1 day, THEN 3 tablets (12 mg total) daily for 1 day, THEN 2 tablets (8 mg total) daily for 1 day, THEN 1 tablet (4 mg total) daily for 1 day.  Dispense: 21 tablet; Refill: 0 - cyclobenzaprine  (FLEXERIL ) 10 MG tablet; Take 0.5-1 tablets (5-10 mg total) by mouth 3 (three) times daily as needed.  Dispense: 30 tablet; Refill: 0  - Suspect low back strain with sciatica on the left side - Failed NSAIDs - Add Medrol  dose pack and flexeril  - Avoid NSAIDs (Ibuprofen /Advil /Motrin  or Naproxen /Aleve ) while on steroid - Tylenol  if okay for breakthrough pain - Heat to area - Epsom salt soak if able to get in and out of bath tub safely - Back exercises and stretches provided via AVS - Seek in person evaluation if worsening or fails to improve with treatment   Follow Up Instructions: I discussed the assessment and treatment plan with the patient. The patient was provided an opportunity to ask questions and all were answered. The patient agreed with the plan and demonstrated an understanding of the instructions.  A copy of instructions were sent to the patient via MyChart unless otherwise noted below.    The patient was advised to call back or seek an in-person evaluation if the symptoms worsen or if the condition fails to improve as anticipated.    Heidi Heidi Dickinson, PA-C     [1]  Allergies Allergen Reactions   Morphine  And Codeine  Hives and Shortness Of Breath   Latex Hives   Prozac  [Fluoxetine  Hcl]     SI   Valium Other (See Comments)    does not do well with me   Epinephrine Palpitations  [2]  Current Outpatient Medications:    cyclobenzaprine  (FLEXERIL ) 10 MG tablet, Take 0.5-1 tablets (5-10 mg total) by mouth 3 (three) times daily as needed., Disp: 30 tablet, Rfl: 0   methylPREDNISolone  (MEDROL  DOSEPAK) 4 MG TBPK tablet, Take 6 tablets (24 mg total) by mouth daily for 1 day, THEN 5 tablets (20 mg total) daily for 1 day, THEN 4 tablets (16 mg total) daily for 1 day, THEN 3 tablets (12 mg total) daily for 1 day, THEN 2 tablets (8 mg total) daily for 1 day, THEN 1 tablet (4 mg total) daily for 1 day., Disp: 21 tablet, Rfl: 0   albuterol  (VENTOLIN  HFA) 108 (90 Base) MCG/ACT inhaler, Inhale 2 puffs into the lungs every 6 (six) hours as needed for wheezing or shortness of breath., Disp: 8 g, Rfl: 0   aspirin-acetaminophen -caffeine (EXCEDRIN MIGRAINE) 250-250-65 MG tablet, Take 1 tablet by mouth every 6 (six) hours as needed for headache., Disp: , Rfl:  azelastine  (ASTELIN ) 0.1 % nasal spray, Place 2 sprays into both nostrils 2 (two) times daily. Use in each nostril as directed, Disp: 30 mL, Rfl: 0   benzonatate  (TESSALON ) 100 MG capsule, Take 1 capsule (100 mg total) by mouth every 8 (eight) hours., Disp: 21 capsule, Rfl: 0   cetirizine  (ZYRTEC ) 10 MG tablet, Take 1 tablet (10 mg total) by mouth daily for 10 days., Disp: 30 tablet, Rfl: 0   fluconazole  (DIFLUCAN ) 150 MG tablet, Take 1 tablet PO once. Repeat in 3 days if needed., Disp: 2 tablet, Rfl: 0   fluticasone  (FLONASE ) 50 MCG/ACT nasal spray, Place 1 spray into both nostrils daily for 3 days., Disp: 16 g, Rfl: 0   ketoconazole  (NIZORAL ) 2 % cream, Apply 1 Application topically daily., Disp: 30 g, Rfl: 0   meloxicam  (MOBIC ) 7.5 MG tablet, Take 1 tablet (7.5 mg total) by mouth daily., Disp: 30 tablet, Rfl: 0   phenazopyridine  (PYRIDIUM ) 100 MG tablet,  Take 1 tablet (100 mg total) by mouth 3 (three) times daily as needed for pain., Disp: 10 tablet, Rfl: 0   valACYclovir  (VALTREX ) 1000 MG tablet, Take 1 tablet (1,000 mg total) by mouth daily for 5 days at the earliest sign of a flare., Disp: 40 tablet, Rfl: 0  "

## 2024-04-09 ENCOUNTER — Encounter: Payer: Self-pay | Admitting: Physician Assistant

## 2024-04-11 ENCOUNTER — Emergency Department (HOSPITAL_BASED_OUTPATIENT_CLINIC_OR_DEPARTMENT_OTHER)
Admission: EM | Admit: 2024-04-11 | Discharge: 2024-04-11 | Disposition: A | Discharge status: Home / Self Care | Source: Home / Self Care | Attending: Emergency Medicine | Admitting: Emergency Medicine

## 2024-04-11 ENCOUNTER — Other Ambulatory Visit: Payer: Self-pay

## 2024-04-11 ENCOUNTER — Encounter (HOSPITAL_BASED_OUTPATIENT_CLINIC_OR_DEPARTMENT_OTHER): Payer: Self-pay | Admitting: Emergency Medicine

## 2024-04-11 ENCOUNTER — Emergency Department (HOSPITAL_BASED_OUTPATIENT_CLINIC_OR_DEPARTMENT_OTHER)

## 2024-04-11 DIAGNOSIS — H81399 Other peripheral vertigo, unspecified ear: Secondary | ICD-10-CM | POA: Diagnosis not present

## 2024-04-11 DIAGNOSIS — M545 Low back pain, unspecified: Secondary | ICD-10-CM | POA: Insufficient documentation

## 2024-04-11 DIAGNOSIS — Z91040 Latex allergy status: Secondary | ICD-10-CM | POA: Diagnosis not present

## 2024-04-11 DIAGNOSIS — R42 Dizziness and giddiness: Secondary | ICD-10-CM | POA: Diagnosis not present

## 2024-04-11 LAB — COMPREHENSIVE METABOLIC PANEL WITH GFR
ALT: 8 U/L (ref 0–44)
AST: 18 U/L (ref 15–41)
Albumin: 4.5 g/dL (ref 3.5–5.0)
Alkaline Phosphatase: 70 U/L (ref 38–126)
Anion gap: 11 (ref 5–15)
BUN: 20 mg/dL (ref 6–20)
CO2: 26 mmol/L (ref 22–32)
Calcium: 9.5 mg/dL (ref 8.9–10.3)
Chloride: 106 mmol/L (ref 98–111)
Creatinine, Ser: 1.19 mg/dL — ABNORMAL HIGH (ref 0.44–1.00)
GFR, Estimated: 58 mL/min — ABNORMAL LOW
Glucose, Bld: 72 mg/dL (ref 70–99)
Potassium: 3.9 mmol/L (ref 3.5–5.1)
Sodium: 142 mmol/L (ref 135–145)
Total Bilirubin: 0.3 mg/dL (ref 0.0–1.2)
Total Protein: 7.1 g/dL (ref 6.5–8.1)

## 2024-04-11 LAB — CBC
HCT: 38.8 % (ref 36.0–46.0)
Hemoglobin: 11.8 g/dL — ABNORMAL LOW (ref 12.0–15.0)
MCH: 19.4 pg — ABNORMAL LOW (ref 26.0–34.0)
MCHC: 30.4 g/dL (ref 30.0–36.0)
MCV: 63.9 fL — ABNORMAL LOW (ref 80.0–100.0)
Platelets: 324 K/uL (ref 150–400)
RBC: 6.07 MIL/uL — ABNORMAL HIGH (ref 3.87–5.11)
RDW: 18.6 % — ABNORMAL HIGH (ref 11.5–15.5)
WBC: 9.4 K/uL (ref 4.0–10.5)
nRBC: 0 % (ref 0.0–0.2)

## 2024-04-11 LAB — URINALYSIS, ROUTINE W REFLEX MICROSCOPIC
Bacteria, UA: NONE SEEN
Bilirubin Urine: NEGATIVE
Glucose, UA: NEGATIVE mg/dL
Hgb urine dipstick: NEGATIVE
Ketones, ur: NEGATIVE mg/dL
Nitrite: NEGATIVE
Protein, ur: NEGATIVE mg/dL
Specific Gravity, Urine: 1.024 (ref 1.005–1.030)
pH: 7 (ref 5.0–8.0)

## 2024-04-11 LAB — PREGNANCY, URINE: Preg Test, Ur: NEGATIVE

## 2024-04-11 LAB — CBG MONITORING, ED: Glucose-Capillary: 82 mg/dL (ref 70–99)

## 2024-04-11 MED ORDER — MECLIZINE HCL 25 MG PO TABS: 25.0000 mg | ORAL_TABLET | Freq: Three times a day (TID) | ORAL | 0 refills | Status: AC | PRN

## 2024-04-11 MED ORDER — DICLOFENAC SODIUM 75 MG PO TBEC: 75.0000 mg | DELAYED_RELEASE_TABLET | Freq: Two times a day (BID) | ORAL | 1 refills | Status: AC

## 2024-04-11 NOTE — ED Triage Notes (Signed)
 Per ems  Pt via ems from work with vertigo. Started prednisone  recently, believes this may be related to.   Bp 130/80 Pulse 86 98 percent 126 cbg   GCS 15 A&o x4 Back spasms continue

## 2024-04-11 NOTE — ED Provider Notes (Signed)
 " [place] [facility] AT [facility] Provider Note   CSN: [id number] Arrival date & time: 04/11/24  1631     Patient presents with: Dizziness   Heidi [name] is a 44 y.o. female who presents to the ED today with primary concern of vertigo.  Recently started on prednisone  as well as Flexeril  for lower back pain.  Review of previous visits shows televisit on 22 March 2024 for urinary symptoms, initially treated with cephalexin , did not have initial improvement and was seen again at PCP office, started on Augmentin  and has had resolution of urinary symptoms since.  She presented to her primary care again via televisit on 16 January, and was treated for left-sided lower back pain, has been treating with ibuprofen  at home without any significant relief, and therefore started on a Medrol  Dosepak along with Flexeril  at that time.  She has had some improvement with this, denies having any urinary symptoms presently, denies any nausea or vomiting however does have sensation of vertigo primarily with standing and walking.  At rest does not have sensation of vertigo, still does have mild to moderate left-sided back discomfort.    Dizziness      Prior to Admission medications  Medication Sig Start Date End Date Taking? Authorizing Provider  diclofenac  (VOLTAREN ) 75 MG EC tablet Take 1 tablet (75 mg total) by mouth 2 (two) times daily. 04/11/24  Yes Myriam Dorn BROCKS, PA  meclizine  (ANTIVERT ) 25 MG tablet Take 1 tablet (25 mg total) by mouth 3 (three) times daily as needed for dizziness. 04/11/24  Yes Myriam Dorn BROCKS, PA  albuterol  (VENTOLIN  HFA) 108 (90 Base) MCG/ACT inhaler Inhale 2 puffs into the lungs every 6 (six) hours as needed for wheezing or shortness of breath. 03/24/21   Malvina Ellen, MD  aspirin-acetaminophen -caffeine (EXCEDRIN MIGRAINE) 250-250-65 MG tablet Take 1 tablet by mouth every 6 (six) hours as needed for headache.    [provider]  azelastine   (ASTELIN ) 0.1 % nasal spray Place 2 sprays into both nostrils 2 (two) times daily. Use in each nostril as directed 07/11/23   Johnie Flaming A, NP  benzonatate  (TESSALON ) 100 MG capsule Take 1 capsule (100 mg total) by mouth every 8 (eight) hours. 07/11/23   Johnie Flaming A, NP  cetirizine  (ZYRTEC ) 10 MG tablet Take 1 tablet (10 mg total) by mouth daily for 10 days. 03/21/21 03/31/21  Hazen Darryle BRAVO, FNP  cyclobenzaprine  (FLEXERIL ) 10 MG tablet Take 1/2-1 tablets (5-10 mg total) by mouth 3 (three) times daily as needed. 04/06/24   Vivienne Delon HERO, PA-C  fluconazole  (DIFLUCAN ) 150 MG tablet Take 1 tablet PO once. Repeat in 3 days if needed. 03/22/24   Gladis Elsie BROCKS, PA-C  fluticasone  (FLONASE ) 50 MCG/ACT nasal spray Place 1 spray into both nostrils daily for 3 days. 03/21/21 03/24/21  Mound, Haley E, FNP  ketoconazole  (NIZORAL ) 2 % cream Apply 1 Application topically daily. 07/07/22   Christopher Savannah, PA-C  meloxicam  (MOBIC ) 7.5 MG tablet Take 1 tablet (7.5 mg total) by mouth daily. 05/29/23   Mecum, Erin E, PA-C  methylPREDNISolone  (MEDROL  DOSEPAK) 4 MG TBPK tablet Take 6 tablets (24 mg total) by mouth daily for 1 day, THEN 5 tablets (20 mg total) daily for 1 day, THEN 4 tablets (16 mg total) daily for 1 day, THEN 3 tablets (12 mg total) daily for 1 day, THEN 2 tablets (8 mg total) daily for 1 day, THEN 1 tablet (4 mg total) daily for 1 day. 04/06/24  04/12/24  Vivienne Delon HERO, PA-C  phenazopyridine  (PYRIDIUM ) 100 MG tablet Take 1 tablet (100 mg total) by mouth 3 (three) times daily as needed for pain. 03/22/24   Gladis Elsie BROCKS, PA-C  valACYclovir  (VALTREX ) 1000 MG tablet Take 1 tablet (1,000 mg total) by mouth daily for 5 days at the earliest sign of a flare. 09/30/23   Tharon Lung, MD  pantoprazole  (PROTONIX ) 20 MG tablet Take 1 tablet (20 mg total) by mouth daily. 01/22/20 07/08/20  Van Knee, MD    Allergies: Morphine and codeine , Latex, Prozac  [fluoxetine  hcl], Valium, and Epinephrine     Review of Systems  Musculoskeletal:  Positive for back pain.  Neurological:  Positive for dizziness.  All other systems reviewed and are negative.   Updated Vital Signs BP (!) 132/92   Pulse 78   Temp (!) 97.5 F (36.4 C)   Resp 17   Ht 5' 4 (1.626 m)   Wt 81.6 kg   LMP 03/27/2024   SpO2 97%   BMI 30.88 kg/m   Physical Exam Vitals and nursing note reviewed.  Constitutional:      General: She is not in acute distress.    Appearance: Normal appearance.  HENT:     Head: Normocephalic and atraumatic.     Mouth/Throat:     Mouth: Mucous membranes are moist.     Pharynx: Oropharynx is clear.  Eyes:     Extraocular Movements: Extraocular movements intact.     Conjunctiva/sclera: Conjunctivae normal.     Pupils: Pupils are equal, round, and reactive to light.  Cardiovascular:     Rate and Rhythm: Normal rate and regular rhythm.     Pulses: Normal pulses.     Heart sounds: Normal heart sounds. No murmur heard.    No friction rub. No gallop.  Pulmonary:     Effort: Pulmonary effort is normal.     Breath sounds: Normal breath sounds.  Abdominal:     General: Abdomen is flat. Bowel sounds are normal.     Palpations: Abdomen is soft.  Musculoskeletal:        General: Normal range of motion.     Cervical back: Normal range of motion and neck supple.     Right lower leg: No edema.     Left lower leg: No edema.  Skin:    General: Skin is warm and dry.     Capillary Refill: Capillary refill takes less than 2 seconds.  Neurological:     General: No focal deficit present.     Mental Status: She is alert and oriented to person, place, and time. Mental status is at baseline.     GCS: GCS eye subscore is 4. GCS verbal subscore is 5. GCS motor subscore is 6.     Cranial Nerves: No cranial nerve deficit, dysarthria or facial asymmetry.     Sensory: No sensory deficit.     Motor: No weakness, tremor, atrophy or abnormal muscle tone.     Coordination: Coordination is intact.      Gait: Gait is intact.     Comments: Ambulates using a antalgic gait on left.  Psychiatric:        Mood and Affect: Mood normal.     (all labs ordered are listed, but only abnormal results are displayed) Labs Reviewed  COMPREHENSIVE METABOLIC PANEL WITH GFR - Abnormal; Notable for the following components:      Result Value   Creatinine, Ser 1.19 (*)    GFR, Estimated  58 (*)    All other components within normal limits  CBC - Abnormal; Notable for the following components:   RBC 6.07 (*)    Hemoglobin 11.8 (*)    MCV 63.9 (*)    MCH 19.4 (*)    RDW 18.6 (*)    All other components within normal limits  URINALYSIS, ROUTINE W REFLEX MICROSCOPIC - Abnormal; Notable for the following components:   Leukocytes,Ua SMALL (*)    All other components within normal limits  PREGNANCY, URINE  CBG MONITORING, ED    EKG: None  Radiology: CT Head Wo Contrast Result Date: 04/11/2024 CLINICAL DATA:  Vertigo. EXAM: CT HEAD WITHOUT CONTRAST TECHNIQUE: Contiguous axial images were obtained from the base of the skull through the vertex without intravenous contrast. RADIATION DOSE REDUCTION: This exam was performed according to the departmental dose-optimization program which includes automated exposure control, adjustment of the mA and/or kV according to patient size and/or use of iterative reconstruction technique. COMPARISON:  June 01, 2011 FINDINGS: Brain: No evidence of acute infarction, hemorrhage, hydrocephalus, extra-axial collection or mass lesion/mass effect. Vascular: No hyperdense vessel or unexpected calcification. Skull: Normal. Negative for fracture or focal lesion. Sinuses/Orbits: No acute finding. Other: None. IMPRESSION: No acute intracranial pathology. Electronically Signed   By: Suzen Dials M.D.   On: 04/11/2024 19:22     Procedures   Medications Ordered in the ED - No data to display                                  Medical Decision Making Amount and/or Complexity of  Data Reviewed Labs: ordered. Radiology: ordered.  Risk Prescription drug management.   Medical Decision Making:   XZARIA TEO is a 44 y.o. female who presented to the ED today with headache, vertigo, left-sided back pain detailed above.    External chart has been reviewed including previous labs and imaging. Patient placed on continuous vitals and telemetry monitoring while in ED which was reviewed periodically.  Complete initial physical exam performed, notably the patient  was alert and oriented in no apparent distress.  Physical exam as noted above..    Reviewed and confirmed nursing documentation for past medical history, family history, social history.    Initial Assessment:   With the patient's presentation of vertigo along with left-sided back pain, she has recently been on corticosteroids along with muscle relaxer, I consider this likely to be related to medication reaction.  Further consider acute neurovascular insult, BPPV, acoustic neuritis, labyrinthitis, TM perforation.  She does not have any nystagmus on her neurologic exam and no focal deficits so do not find evidence of central vertigo at this time.  With back pain also consider possible UTI/pyelonephritis.  Initial Plan:  CT imaging of the head without contrast to evaluate for intracranial bleed and/or intracranial mass. Screening labs including CBC and Metabolic panel to evaluate for infectious or metabolic etiology of disease.  Urinalysis with reflex culture ordered to evaluate for UTI or relevant urologic/nephrologic pathology.  EKG to evaluate for cardiac pathology Objective evaluation as below reviewed   Initial Study Results:   Laboratory  All laboratory results reviewed without evidence of clinically relevant pathology.   Exceptions include: None  EKG EKG was reviewed independently. Rate, rhythm, axis, intervals all examined and without medically relevant abnormality. ST segments without concerns for  elevations.    Radiology:  All images reviewed independently. Agree with radiology report at  this time.   CT Head Wo Contrast Result Date: 04/11/2024 CLINICAL DATA:  Vertigo. EXAM: CT HEAD WITHOUT CONTRAST TECHNIQUE: Contiguous axial images were obtained from the base of the skull through the vertex without intravenous contrast. RADIATION DOSE REDUCTION: This exam was performed according to the departmental dose-optimization program which includes automated exposure control, adjustment of the mA and/or kV according to patient size and/or use of iterative reconstruction technique. COMPARISON:  June 01, 2011 FINDINGS: Brain: No evidence of acute infarction, hemorrhage, hydrocephalus, extra-axial collection or mass lesion/mass effect. Vascular: No hyperdense vessel or unexpected calcification. Skull: Normal. Negative for fracture or focal lesion. Sinuses/Orbits: No acute finding. Other: None. IMPRESSION: No acute intracranial pathology. Electronically Signed   By: Suzen Dials M.D.   On: 04/11/2024 19:22     Reassessment and Plan:   UA does not support current infection at this time, and she does not have a CVA tenderness which does not suggest cystitis or pyelonephritis is current cause of her vertigo.  She has no nystagmus on physical exam, and she is ambulatory without assistance using normal gait, therefore do not think she has any evidence for central causes of vertigo at this time, also do not have any positional changes, deferred Dix-Hallpike maneuver at this time to rule out BPPV secondary to her current symptoms.  TM bilaterally is intact, therefore do not believe that there is any evidence for other causes of peripheral vertigo at this time.  However she does have peripheral vertigo secondary to likely medication reaction, likely due to corticosteroid administration.  She has taken last dose of this over the last 24 hours, so plan at this time is to have her continue to hydrate, rest,  discontinue use of corticosteroids, and reassess.  Instructions to follow-up with primary care for continuing assessment of her condition.  Also regarding her back pain, likely of continued sequelae of muscular strain, will manage this with diclofenac  in the outpatient setting and reevaluation at primary care.  Urine does not show any signs of current UTI, therefore will not alter her current antibiotic regimen for same.  Care plan discussed with patient who verbalizes understanding agreement has no further concerns at this time thus will be discharged with outpatient management.  Meclizine  for bided for management of episodic vertigo.       Final diagnoses:  Peripheral vertigo, unspecified laterality  Acute left-sided low back pain without sciatica    ED Discharge Orders          Ordered    meclizine  (ANTIVERT ) 25 MG tablet  3 times daily PRN        04/11/24 1957    diclofenac  (VOLTAREN ) 75 MG EC tablet  2 times daily        04/11/24 1957               Myriam Dorn BROCKS, [place] 04/11/24 2142    Pamella Ozell LABOR, DO 04/11/24 2321  "

## 2024-04-11 NOTE — ED Triage Notes (Signed)
 Pt taking prednisone  and flexiril x 4 days and has felt dizzy since she began taking it. Pt became pale and then flushed and had to hold the wall to get to safety. Pt a&o x 4; nad noted.  States she is anxious now (seated) and has a headache.

## 2024-04-11 NOTE — ED Notes (Signed)
 Reviewed AVS/discharge instructions with patient. Time allotted for and all questions answered. Patient is agreeable for d/c and escorted to ED exit by staff.

## 2024-04-11 NOTE — ED Notes (Signed)
 Pt c/o dry mouth x several days. Anxious during blood draw

## 2024-04-11 NOTE — Discharge Instructions (Signed)
 As discussed, you have been prescribed a medication called diclofenac  for your back pain, this can be used along with Tylenol  for management of your pain.  Do not take any other nonsteroidal medications such as ibuprofen  or naproxen  while taking this medication.  Further, you been prescribed a medication called meclizine  for your vertigo.  This can be taken as needed for management of your episodic vertigo, if this continues over the next several days despite not taking the prednisone  or the muscle relaxers you are prescribed, would highly encourage that you follow-up with your primary care and possibly refer to neurology for further assessment.
